# Patient Record
Sex: Female | Born: 1965
Health system: Southern US, Community
[De-identification: ages and names within clinical notes are randomized; demographics above are authoritative.]

## PROBLEM LIST (undated history)

## (undated) DIAGNOSIS — R079 Chest pain, unspecified: Secondary | ICD-10-CM

## (undated) DIAGNOSIS — M1990 Unspecified osteoarthritis, unspecified site: Secondary | ICD-10-CM

## (undated) DIAGNOSIS — E079 Disorder of thyroid, unspecified: Secondary | ICD-10-CM

## (undated) DIAGNOSIS — R002 Palpitations: Secondary | ICD-10-CM

## (undated) DIAGNOSIS — R0600 Dyspnea, unspecified: Secondary | ICD-10-CM

## (undated) DIAGNOSIS — F419 Anxiety disorder, unspecified: Secondary | ICD-10-CM

## (undated) DIAGNOSIS — E559 Vitamin D deficiency, unspecified: Secondary | ICD-10-CM

## (undated) DIAGNOSIS — E785 Hyperlipidemia, unspecified: Secondary | ICD-10-CM

## (undated) DIAGNOSIS — L509 Urticaria, unspecified: Secondary | ICD-10-CM

## (undated) DIAGNOSIS — F32A Depression, unspecified: Secondary | ICD-10-CM

## (undated) DIAGNOSIS — E119 Type 2 diabetes mellitus without complications: Secondary | ICD-10-CM

## (undated) DIAGNOSIS — D649 Anemia, unspecified: Secondary | ICD-10-CM

## (undated) DIAGNOSIS — J189 Pneumonia, unspecified organism: Secondary | ICD-10-CM

## (undated) DIAGNOSIS — E039 Hypothyroidism, unspecified: Secondary | ICD-10-CM

## (undated) HISTORY — DX: Chest pain, unspecified: R07.9

## (undated) HISTORY — DX: Vitamin D deficiency, unspecified: E55.9

## (undated) HISTORY — PX: WISDOM TOOTH EXTRACTION: SHX21

## (undated) HISTORY — DX: Urticaria, unspecified: L50.9

## (undated) HISTORY — DX: Palpitations: R00.2

## (undated) HISTORY — DX: Anxiety disorder, unspecified: F41.9

## (undated) HISTORY — PX: UTERINE FIBROID EMBOLIZATION: SHX825

## (undated) HISTORY — DX: Hyperlipidemia, unspecified: E78.5

---

## 1998-01-28 ENCOUNTER — Encounter: Admission: RE | Admit: 1998-01-28 | Discharge: 1998-04-28 | Payer: Self-pay | Admitting: Family Medicine

## 1999-08-14 ENCOUNTER — Other Ambulatory Visit: Admission: RE | Admit: 1999-08-14 | Discharge: 1999-08-14 | Payer: Self-pay | Admitting: *Deleted

## 2001-01-30 ENCOUNTER — Ambulatory Visit (HOSPITAL_COMMUNITY): Admission: RE | Admit: 2001-01-30 | Discharge: 2001-01-30 | Payer: Self-pay | Admitting: Family Medicine

## 2001-01-30 ENCOUNTER — Encounter: Payer: Self-pay | Admitting: Family Medicine

## 2001-09-19 ENCOUNTER — Encounter: Payer: Self-pay | Admitting: *Deleted

## 2001-09-19 ENCOUNTER — Ambulatory Visit (HOSPITAL_COMMUNITY): Admission: RE | Admit: 2001-09-19 | Discharge: 2001-09-19 | Payer: Self-pay | Admitting: *Deleted

## 2001-11-28 ENCOUNTER — Encounter: Payer: Self-pay | Admitting: Family Medicine

## 2001-11-28 ENCOUNTER — Ambulatory Visit (HOSPITAL_COMMUNITY): Admission: RE | Admit: 2001-11-28 | Discharge: 2001-11-28 | Payer: Self-pay | Admitting: Family Medicine

## 2003-11-26 ENCOUNTER — Other Ambulatory Visit: Admission: RE | Admit: 2003-11-26 | Discharge: 2003-11-26 | Payer: Self-pay | Admitting: Family Medicine

## 2004-07-25 ENCOUNTER — Ambulatory Visit (HOSPITAL_COMMUNITY): Admission: RE | Admit: 2004-07-25 | Discharge: 2004-07-25 | Payer: Self-pay | Admitting: Family Medicine

## 2005-01-25 ENCOUNTER — Encounter (HOSPITAL_COMMUNITY): Admission: RE | Admit: 2005-01-25 | Discharge: 2005-01-26 | Payer: Self-pay | Admitting: Family Medicine

## 2005-02-22 ENCOUNTER — Ambulatory Visit (HOSPITAL_COMMUNITY): Admission: RE | Admit: 2005-02-22 | Discharge: 2005-02-22 | Payer: Self-pay | Admitting: Endocrinology

## 2005-12-04 ENCOUNTER — Other Ambulatory Visit: Admission: RE | Admit: 2005-12-04 | Discharge: 2005-12-04 | Payer: Self-pay | Admitting: Obstetrics and Gynecology

## 2005-12-12 ENCOUNTER — Encounter: Admission: RE | Admit: 2005-12-12 | Discharge: 2005-12-12 | Payer: Self-pay | Admitting: Obstetrics and Gynecology

## 2005-12-28 ENCOUNTER — Encounter: Admission: RE | Admit: 2005-12-28 | Discharge: 2005-12-28 | Payer: Self-pay | Admitting: Obstetrics and Gynecology

## 2007-01-24 ENCOUNTER — Encounter: Admission: RE | Admit: 2007-01-24 | Discharge: 2007-01-24 | Payer: Self-pay | Admitting: Family Medicine

## 2007-06-20 ENCOUNTER — Other Ambulatory Visit: Admission: RE | Admit: 2007-06-20 | Discharge: 2007-06-20 | Payer: Self-pay | Admitting: Obstetrics and Gynecology

## 2008-05-10 ENCOUNTER — Encounter: Admission: RE | Admit: 2008-05-10 | Discharge: 2008-05-10 | Payer: Self-pay | Admitting: Obstetrics and Gynecology

## 2009-07-29 ENCOUNTER — Encounter: Admission: RE | Admit: 2009-07-29 | Discharge: 2009-07-29 | Payer: Self-pay | Admitting: Family Medicine

## 2009-07-29 ENCOUNTER — Other Ambulatory Visit: Admission: RE | Admit: 2009-07-29 | Discharge: 2009-07-29 | Payer: Self-pay | Admitting: Family Medicine

## 2009-12-17 ENCOUNTER — Emergency Department (HOSPITAL_COMMUNITY): Admission: EM | Admit: 2009-12-17 | Discharge: 2009-12-17 | Payer: Self-pay | Admitting: Emergency Medicine

## 2010-02-20 ENCOUNTER — Encounter: Admission: RE | Admit: 2010-02-20 | Discharge: 2010-02-20 | Payer: Self-pay | Admitting: Family Medicine

## 2010-10-01 ENCOUNTER — Encounter: Payer: Self-pay | Admitting: Endocrinology

## 2010-10-02 ENCOUNTER — Encounter: Payer: Self-pay | Admitting: Obstetrics and Gynecology

## 2011-02-08 ENCOUNTER — Other Ambulatory Visit: Payer: Self-pay | Admitting: Family Medicine

## 2011-02-08 DIAGNOSIS — Z1231 Encounter for screening mammogram for malignant neoplasm of breast: Secondary | ICD-10-CM

## 2011-02-15 ENCOUNTER — Ambulatory Visit
Admission: RE | Admit: 2011-02-15 | Discharge: 2011-02-15 | Disposition: A | Discharge status: Home / Self Care | Payer: BC Managed Care – PPO | Source: Ambulatory Visit | Attending: Family Medicine | Admitting: Family Medicine

## 2011-02-15 DIAGNOSIS — Z1231 Encounter for screening mammogram for malignant neoplasm of breast: Secondary | ICD-10-CM

## 2011-03-16 ENCOUNTER — Other Ambulatory Visit: Payer: Self-pay | Admitting: Family Medicine

## 2011-03-16 DIAGNOSIS — N949 Unspecified condition associated with female genital organs and menstrual cycle: Secondary | ICD-10-CM

## 2011-03-19 ENCOUNTER — Ambulatory Visit
Admission: RE | Admit: 2011-03-19 | Discharge: 2011-03-19 | Disposition: A | Discharge status: Home / Self Care | Payer: BC Managed Care – PPO | Source: Ambulatory Visit | Attending: Family Medicine | Admitting: Family Medicine

## 2011-03-19 DIAGNOSIS — N949 Unspecified condition associated with female genital organs and menstrual cycle: Secondary | ICD-10-CM

## 2012-09-12 ENCOUNTER — Other Ambulatory Visit: Payer: Self-pay | Admitting: Family Medicine

## 2012-09-12 ENCOUNTER — Ambulatory Visit
Admission: RE | Admit: 2012-09-12 | Discharge: 2012-09-12 | Disposition: A | Discharge status: Home / Self Care | Payer: BC Managed Care – PPO | Source: Ambulatory Visit | Attending: Family Medicine | Admitting: Family Medicine

## 2012-09-12 ENCOUNTER — Other Ambulatory Visit (HOSPITAL_COMMUNITY)
Admission: RE | Admit: 2012-09-12 | Discharge: 2012-09-12 | Disposition: A | Discharge status: Home / Self Care | Payer: BC Managed Care – PPO | Source: Ambulatory Visit | Attending: Family Medicine | Admitting: Family Medicine

## 2012-09-12 DIAGNOSIS — R05 Cough: Secondary | ICD-10-CM

## 2012-09-12 DIAGNOSIS — Z0000 Encounter for general adult medical examination without abnormal findings: Secondary | ICD-10-CM | POA: Insufficient documentation

## 2012-09-12 DIAGNOSIS — R059 Cough, unspecified: Secondary | ICD-10-CM

## 2013-03-20 ENCOUNTER — Other Ambulatory Visit: Payer: Self-pay | Admitting: Family Medicine

## 2013-03-20 ENCOUNTER — Other Ambulatory Visit: Payer: Self-pay | Admitting: *Deleted

## 2013-03-20 ENCOUNTER — Ambulatory Visit
Admission: RE | Admit: 2013-03-20 | Discharge: 2013-03-20 | Disposition: A | Discharge status: Home / Self Care | Payer: BC Managed Care – PPO | Source: Ambulatory Visit | Attending: Family Medicine | Admitting: Family Medicine

## 2013-03-20 DIAGNOSIS — R609 Edema, unspecified: Secondary | ICD-10-CM

## 2013-03-20 DIAGNOSIS — R52 Pain, unspecified: Secondary | ICD-10-CM

## 2013-05-28 ENCOUNTER — Other Ambulatory Visit: Payer: Self-pay

## 2013-05-28 DIAGNOSIS — Z1231 Encounter for screening mammogram for malignant neoplasm of breast: Secondary | ICD-10-CM

## 2013-06-29 ENCOUNTER — Ambulatory Visit: Payer: BC Managed Care – PPO

## 2013-08-04 ENCOUNTER — Ambulatory Visit
Admission: RE | Admit: 2013-08-04 | Discharge: 2013-08-04 | Disposition: A | Discharge status: Home / Self Care | Payer: BC Managed Care – PPO | Source: Ambulatory Visit

## 2013-08-04 DIAGNOSIS — Z1231 Encounter for screening mammogram for malignant neoplasm of breast: Secondary | ICD-10-CM

## 2013-11-13 ENCOUNTER — Other Ambulatory Visit: Payer: Self-pay | Admitting: Family Medicine

## 2013-11-13 DIAGNOSIS — R0781 Pleurodynia: Secondary | ICD-10-CM

## 2013-11-17 ENCOUNTER — Ambulatory Visit
Admission: RE | Admit: 2013-11-17 | Discharge: 2013-11-17 | Disposition: A | Discharge status: Home / Self Care | Payer: BC Managed Care – PPO | Source: Ambulatory Visit | Attending: Family Medicine | Admitting: Family Medicine

## 2013-11-17 DIAGNOSIS — R0781 Pleurodynia: Secondary | ICD-10-CM

## 2014-03-01 ENCOUNTER — Ambulatory Visit
Admission: RE | Admit: 2014-03-01 | Discharge: 2014-03-01 | Disposition: A | Discharge status: Home / Self Care | Payer: BC Managed Care – PPO | Source: Ambulatory Visit | Attending: Family Medicine | Admitting: Family Medicine

## 2014-03-01 ENCOUNTER — Other Ambulatory Visit: Payer: Self-pay | Admitting: Family Medicine

## 2014-03-01 DIAGNOSIS — M79641 Pain in right hand: Secondary | ICD-10-CM

## 2014-03-09 ENCOUNTER — Other Ambulatory Visit: Payer: Self-pay | Admitting: Orthopaedic Surgery

## 2014-03-09 DIAGNOSIS — M25512 Pain in left shoulder: Secondary | ICD-10-CM

## 2014-03-20 ENCOUNTER — Ambulatory Visit
Admission: RE | Admit: 2014-03-20 | Discharge: 2014-03-20 | Disposition: A | Discharge status: Home / Self Care | Payer: BC Managed Care – PPO | Source: Ambulatory Visit | Attending: Orthopaedic Surgery | Admitting: Orthopaedic Surgery

## 2014-03-20 DIAGNOSIS — M25512 Pain in left shoulder: Secondary | ICD-10-CM

## 2014-06-24 ENCOUNTER — Encounter: Payer: Self-pay | Admitting: Internal Medicine

## 2014-06-24 ENCOUNTER — Other Ambulatory Visit: Payer: Self-pay | Admitting: *Deleted

## 2014-06-24 ENCOUNTER — Ambulatory Visit (INDEPENDENT_AMBULATORY_CARE_PROVIDER_SITE_OTHER): Payer: BC Managed Care – PPO | Admitting: Internal Medicine

## 2014-06-24 VITALS — BP 118/76 | HR 80 | Temp 97.8°F | Resp 12 | Ht 66.0 in | Wt 179.0 lb

## 2014-06-24 DIAGNOSIS — E1165 Type 2 diabetes mellitus with hyperglycemia: Secondary | ICD-10-CM

## 2014-06-24 DIAGNOSIS — IMO0002 Reserved for concepts with insufficient information to code with codable children: Secondary | ICD-10-CM

## 2014-06-24 DIAGNOSIS — E890 Postprocedural hypothyroidism: Secondary | ICD-10-CM

## 2014-06-24 LAB — TSH: TSH: 0.31 u[IU]/mL — ABNORMAL LOW (ref 0.35–4.50)

## 2014-06-24 LAB — T4, FREE: Free T4: 1.18 ng/dL (ref 0.60–1.60)

## 2014-06-24 MED ORDER — INSULIN PEN NEEDLE 32G X 4 MM MISC: Status: DC

## 2014-06-24 MED ORDER — INSULIN DETEMIR 100 UNIT/ML FLEXPEN: 30.0000 [IU] | PEN_INJECTOR | Freq: Every day | SUBCUTANEOUS | Status: DC

## 2014-06-24 MED ORDER — INSULIN ASPART 100 UNIT/ML ~~LOC~~ SOLN: SUBCUTANEOUS | Status: DC

## 2014-06-24 MED ORDER — "INSULIN SYRINGE 31G X 5/16"" 0.3 ML MISC": Status: DC

## 2014-06-24 MED ORDER — LEVOTHYROXINE SODIUM 100 MCG PO TABS: 100.0000 ug | ORAL_TABLET | Freq: Every day | ORAL | Status: DC

## 2014-06-24 MED ORDER — METFORMIN HCL ER 500 MG PO TB24: 1000.0000 mg | ORAL_TABLET | Freq: Every day | ORAL | Status: DC

## 2014-06-24 MED ORDER — INSULIN ASPART 100 UNIT/ML FLEXPEN: 8.0000 [IU] | PEN_INJECTOR | Freq: Three times a day (TID) | SUBCUTANEOUS | Status: DC

## 2014-06-24 NOTE — Patient Instructions (Signed)
Please stop JanuMet. Start Metformin XR 1000 mg at dinnertime. Increase Levemir to 30 units and move it at bedtime. Add NovoLog rapid acting insulin and inject this 15 min before every meal. - 8 units before a smaller meal - 10 units before a regular meal - 12 units before a large meal Please let me know if the sugars are consistently <80 or >200.  Please return in 1 month with your sugar log.   PATIENT INSTRUCTIONS FOR TYPE 2 DIABETES:  **Please join MyChart!** - see attached instructions about how to join if you have not done so already.  DIET AND EXERCISE Diet and exercise is an important part of diabetic treatment.  We recommended aerobic exercise in the form of brisk walking (working between 40-60% of maximal aerobic capacity, similar to brisk walking) for 150 minutes per week (such as 30 minutes five days per week) along with 3 times per week performing 'resistance' training (using various gauge rubber tubes with handles) 5-10 exercises involving the major muscle groups (upper body, lower body and core) performing 10-15 repetitions (or near fatigue) each exercise. Start at half the above goal but build slowly to reach the above goals. If limited by weight, joint pain, or disability, we recommend daily walking in a swimming pool with water up to waist to reduce pressure from joints while allow for adequate exercise.    BLOOD GLUCOSES Monitoring your blood glucoses is important for continued management of your diabetes. Please check your blood glucoses 2-4 times a day: fasting, before meals and at bedtime (you can rotate these measurements - e.g. one day check before the 3 meals, the next day check before 2 of the meals and before bedtime, etc.).   HYPOGLYCEMIA (low blood sugar) Hypoglycemia is usually a reaction to not eating, exercising, or taking too much insulin/ other diabetes drugs.  Symptoms include tremors, sweating, hunger, confusion, headache, etc. Treat IMMEDIATELY with 15  grams of Carbs:   4 glucose tablets    cup regular juice/soda   2 tablespoons raisins   4 teaspoons sugar   1 tablespoon honey Recheck blood glucose in 15 mins and repeat above if still symptomatic/blood glucose <100.  RECOMMENDATIONS TO REDUCE YOUR RISK OF DIABETIC COMPLICATIONS: * Take your prescribed MEDICATION(S) * Follow a DIABETIC diet: Complex carbs, fiber rich foods, (monounsaturated and polyunsaturated) fats * AVOID saturated/trans fats, high fat foods, >2,300 mg salt per day. * EXERCISE at least 5 times a week for 30 minutes or preferably daily.  * DO NOT SMOKE OR DRINK more than 1 drink a day. * Check your FEET every day. Do not wear tightfitting shoes. Contact us if you develop an ulcer * See your EYE doctor once a year or more if needed * Get a FLU shot once a year * Get a PNEUMONIA vaccine once before and once after age 44 years  GOALS:  * Your Hemoglobin A1c of <7%  * fasting sugars need to be <130 * after meals sugars need to be <180 (2h after you start eating) * Your Systolic BP should be 161 or lower  * Your Diastolic BP should be 80 or lower  * Your HDL (Good Cholesterol) should be 40 or higher  * Your LDL (Bad Cholesterol) should be 100 or lower. * Your Triglycerides should be 150 or lower  * Your Urine microalbumin (kidney function) should be <30 * Your Body Mass Index should be 25 or lower    Please consider the following ways to cut  down carbs and fat and increase fiber and micronutrients in your diet: - substitute whole grain for white bread or pasta - substitute brown rice for white rice - substitute 90-calorie flat bread pieces for slices of bread when possible - substitute sweet potatoes or yams for white potatoes - substitute humus for margarine - substitute tofu for cheese when possible - substitute almond or rice milk for regular milk (would not drink soy milk daily due to concern for soy estrogen influence on breast cancer risk) - substitute  dark chocolate for other sweets when possible - substitute water - can add lemon or orange slices for taste - for diet sodas (artificial sweeteners will trick your body that you can eat sweets without getting calories and will lead you to overeating and weight gain in the long run) - do not skip breakfast or other meals (this will slow down the metabolism and will result in more weight gain over time)  - can try smoothies made from fruit and almond/rice milk in am instead of regular breakfast - can also try old-fashioned (not instant) oatmeal made with almond/rice milk in am - order the dressing on the side when eating salad at a restaurant (pour less than half of the dressing on the salad) - eat as little meat as possible - can try juicing, but should not forget that juicing will get rid of the fiber, so would alternate with eating raw veg./fruits or drinking smoothies - use as little oil as possible, even when using olive oil - can dress a salad with a mix of balsamic vinegar and lemon juice, for e.g. - use agave nectar, stevia sugar, or regular sugar rather than artificial sweateners - steam or broil/roast veggies  - snack on veggies/fruit/nuts (unsalted, preferably) when possible, rather than processed foods - reduce or eliminate aspartame in diet (it is in diet sodas, chewing gum, etc) Read the labels!  Try to read Dr. Janene Harvey book: "Program for Reversing Diabetes" for other ideas for healthy eating.

## 2014-06-24 NOTE — Progress Notes (Signed)
Patient ID: Samantha [name], female   DOB: 02/14/1966, 48 y.o.   MRN: [id number]  HPI: Samantha [name] is a 48 y.o.-year-old female, referred by her PCP, Dr.White, for management of DM2, insulin-dependent, uncontrolled, without complications.  Patient has been diagnosed with diabetes in 2012; she started insulin in 2015.  Last hemoglobin A1c was: 05/27/2014: 12% 02/2014: 9%  Pt is on a regimen of: - JanuMet XR [id number] mg with dinner - Levemir 22 units qam She was on Farxiga x 2 years, but this was stopped b/c weight loss (205 >> 179 lbs), urinary frequency. She tried regular metformin >> GI sxs.  Pt checks her sugars 1x a day and they are: - am: 282-421 - 2h after b'fast: 282-521 - before lunch: n/c - 2h after lunch: n/c - before dinner: n/c - 2h after dinner: n/c - bedtime: n/c - nighttime: n/c No lows. Lowest sugar was 200s; she has hypoglycemia awareness - ? value  Highest sugar was 500s.  Pt's meals are: - Breakfast: cereal or bisquit - Lunch: hamburger, chinese - Dinner: chicken  - Snacks: 2-3  - no CKD, last BUN/creatinine:  11/16/2013: 20/0.92, GFR 79 - Lipid panel not available. On Crestor. - last eye exam was in 05/2014. No DR.  - no numbness and tingling in her feet.  Pt has FH of DM in mother and brother.  She has a h/o hypothyroidism 2/2 RAI Tx for Graves ds. - She takes LT4 at 7 am. She also has GERD, on Dexilant. Takes this with b'fast!  Reviewed previous TFTs: 07/28/2013: TSH 1.69  She would like me to check her TFTs today   Has FH of hypothyroidism in mother.  ROS: Constitutional: + weight loss, no fatigue, + subjective hyperthermia (hot flushes), + increasd urination and nocturia Eyes: + blurry vision, no xerophthalmia ENT: no sore throat, no nodules palpated in throat, no dysphagia/odynophagia, + hoarseness Cardiovascular: no CP/SOB/palpitations/leg swelling Respiratory: no cough/SOB Gastrointestinal: + N/no V/D/C, +  GERD Musculoskeletal:+ both muscle/joint aches Skin: no rashes, + easy bruising, + hair loss Neurological: no tremors/numbness/tingling/dizziness, + HA Psychiatric: + both: depression/anxiety + low libido  PMH: - Hypothyroidism - DM2 - GERD - Depression/anxiety  No past surgical history on file.  History   Social History  . Marital Status: Single    Spouse Name: N/A    Number of Children: 1, 59 y/o   Occupational History  . Pt account rep   Social History Main Topics  . Smoking status: Never Smoker   . Smokeless tobacco: No  . Alcohol Use: No  . Drug Use: No   Current Outpatient Rx  Name  Route  Sig  Dispense  Refill  . SitaGLIPtin-MetFORMIN HCl (JANUMET XR) [id number] MG TB24      1 tablet         . ACCU-CHEK SOFTCLIX LANCETS lancets               . CRESTOR 5 MG tablet               . DEXILANT 60 MG capsule               . LEVEMIR FLEXTOUCH 100 UNIT/ML Pen               . levothyroxine (SYNTHROID, LEVOTHROID) 112 MCG tablet               . sertraline (ZOLOFT) 100 MG tablet  Allergies  Allergen Reactions  . Atorvastatin   . Bupropion   . Doxepin Hcl    FH: - see HPI PE: BP 118/76  Pulse 80  Temp(Src) 97.8 F (36.6 C) (Oral)  Resp 12  Ht 5\' 6"  (1.676 m)  Wt 179 lb (81.194 kg)  BMI 28.91 kg/m2  SpO2 96%  LMP 07/10/2012 Wt Readings from Last 3 Encounters:  06/24/14 179 lb (81.194 kg)   Constitutional: overweight, in NAD Eyes: PERRLA, EOMI, no exophthalmos ENT: moist mucous membranes, no thyromegaly, no cervical lymphadenopathy Cardiovascular: RRR, No MRG Respiratory: CTA B Gastrointestinal: abdomen soft, NT, ND, BS+ Musculoskeletal: no deformities, strength intact in all 4 Skin: moist, warm, no rashes Neurological: no tremor with outstretched hands, DTR normal in all 4  ASSESSMENT: 1. DM2, insulin-dependent, uncontrolled, without complications  2. Hypothyroidism - postablative for Graves  ds.  PLAN:  1. Patient with 3 years h/o uncontrolled diabetes, on oral antidiabetic regimen + basal insulin, which became insufficient - We discussed about options for treatment, and I suggested to add mealtime insulin:  Patient Instructions  Please stop JanuMet. Start Metformin XR 1000 mg at dinnertime. Increase Levemir to 30 units and move it at bedtime. Add NovoLog rapid acting insulin and inject this 15 min before every meal. - 8 units before a smaller meal - 10 units before a regular meal - 12 units before a large meal Please let me know if the sugars are consistently <80 or >200.  Please return in 1 month with your sugar log.   - Strongly advised her to start checking sugars at different times of the day - check 2-3 times a day, rotating checks - given sugar log and advised how to fill it and to bring it at next appt  - given foot care handout and explained the principles  - given instructions for hypoglycemia management "15-15 rule"  - advised for yearly eye exams >> she is UTD - she had the flu vaccine on 05/27/2014 - Return to clinic in 1 mo with sugar log   2. Hypothyroidism - we discussed about taking the thyroid hormone every day, with water, >30 minutes before breakfast, separated by >4 hours from acid reflux medications, calcium, iron, multivitamins. She needs to move the Dexilant before lunch. - will continue the LT4 112 mcg for now - will check TSH, free t4  Office Visit on 06/24/2014  Component Date Value Ref Range Status  . TSH 06/24/2014 0.31* 0.35 - 4.50 uIU/mL Final  . Free T4 06/24/2014 1.18  0.60 - 1.60 ng/dL Final   TSH a little low >> will decrease the dose of LT4 to 100 mcg since we are also going to separate it more from [place]. Will repeat thyroid labs when she comes back/.

## 2014-06-29 ENCOUNTER — Ambulatory Visit
Admission: RE | Admit: 2014-06-29 | Discharge: 2014-06-29 | Disposition: A | Discharge status: Home / Self Care | Payer: BC Managed Care – PPO | Source: Ambulatory Visit | Attending: Family Medicine | Admitting: Family Medicine

## 2014-06-29 ENCOUNTER — Other Ambulatory Visit: Payer: Self-pay | Admitting: Family Medicine

## 2014-06-29 DIAGNOSIS — S40012A Contusion of left shoulder, initial encounter: Secondary | ICD-10-CM

## 2014-07-28 ENCOUNTER — Ambulatory Visit (INDEPENDENT_AMBULATORY_CARE_PROVIDER_SITE_OTHER): Payer: BC Managed Care – PPO | Admitting: Internal Medicine

## 2014-07-28 ENCOUNTER — Encounter: Payer: Self-pay | Admitting: Internal Medicine

## 2014-07-28 VITALS — BP 114/76 | HR 93 | Temp 97.9°F | Resp 12 | Wt 179.8 lb

## 2014-07-28 DIAGNOSIS — E1165 Type 2 diabetes mellitus with hyperglycemia: Secondary | ICD-10-CM

## 2014-07-28 DIAGNOSIS — IMO0002 Reserved for concepts with insufficient information to code with codable children: Secondary | ICD-10-CM

## 2014-07-28 DIAGNOSIS — E890 Postprocedural hypothyroidism: Secondary | ICD-10-CM

## 2014-07-28 MED ORDER — INSULIN ASPART 100 UNIT/ML FLEXPEN
15.0000 [IU] | PEN_INJECTOR | Freq: Three times a day (TID) | SUBCUTANEOUS | Status: DC
Start: 2014-07-28 — End: 2014-07-28

## 2014-07-28 MED ORDER — INSULIN DETEMIR 100 UNIT/ML FLEXPEN
50.0000 [IU] | PEN_INJECTOR | Freq: Every day | SUBCUTANEOUS | Status: DC
Start: 2014-07-28 — End: 2014-09-13

## 2014-07-28 MED ORDER — METFORMIN HCL ER 500 MG PO TB24: 1000.0000 mg | ORAL_TABLET | Freq: Two times a day (BID) | ORAL | Status: DC

## 2014-07-28 MED ORDER — INSULIN ASPART 100 UNIT/ML FLEXPEN: 15.0000 [IU] | PEN_INJECTOR | Freq: Three times a day (TID) | SUBCUTANEOUS | Status: DC

## 2014-07-28 NOTE — Progress Notes (Signed)
Patient ID: Samantha [name], female   DOB: [birth year], 48 y.o.   MRN: [id number]  HPI: Samantha [name] is a 48 y.o.-year-old female, returning for follow-up for DM2, dx 2012, insulin-dependent since 2015, uncontrolled, without complications. Last visit a month ago.  Last hemoglobin A1c was: 05/27/2014: 12% 02/2014: 9%  Pt was on a regimen of: - JanuMet XR [phone] mg with dinner - Levemir 22 units qam She was on Farxiga x 2 years, but this was stopped b/c weight loss (205 >> 179 lbs), urinary frequency. She tried regular metformin >> GI sxs.  At last visit, we changed to: - Metformin XR 1000 mg at dinnertime. - Levemir 30 units at bedtime. - NovoLog 15 min before every meal. - 8 units before a smaller meal - 10 units before a regular meal - 12 units before a large meal  Pt checks her sugars 2-3x a day and they are still very high: - am: 282-421 >> 282-365 - 2h after b'fast: 282-521 >> 279-404 - before lunch: n/c >> 439 - 2h after lunch: n/c >> 242-431 - before dinner: n/c >> 370-514 - 2h after dinner: n/c >> 320-407, 547 - bedtime: n/c - nighttime: n/c No lows. Lowest sugar was 200s; she has hypoglycemia awareness - ? value  Highest sugar was 500s.  Pt's meals are: - Breakfast: cereal or bisquit - Lunch: hamburger, chinese - Dinner: chicken  - Snacks: 2-3  - no CKD, last BUN/creatinine:  11/16/2013: 20/0.92, GFR 79 - Lipid panel not available. On Crestor. - last eye exam was in 05/2014. No DR.  - no numbness and tingling in her feet.  She has a h/o hypothyroidism 2/2 RAI Tx for Graves ds. - She takes LT4 at 7 am. She also has GERD, on Dexilant. She was taking this with b'fast!, but at last visit, we removed the [place] later in the day. A repeat set of thyroid tests showed a low TSH ( see below), and therefore we decreased her levothyroxine dose to 100 g daily.  Reviewed previous TFTs: Lab Results  Component Value Date   TSH 0.31* 06/24/2014   FREET4 1.18  06/24/2014  07/28/2013: TSH 1.69  I reviewed pt's medications, allergies, PMH, social hx, family hx and no changes required, except as mentioned above.  ROS: Constitutional: no weight gain/loss, no fatigue, + subjective hyperthermia (hot flushes), + nocturia Eyes: + blurry vision, no xerophthalmia ENT: no sore throat, no nodules palpated in throat, no dysphagia/odynophagia, + hoarseness, + tinnitus Cardiovascular: no CP/SOB/palpitations/leg swelling Respiratory: no cough/SOB Gastrointestinal: no N/V/D/C/GERD Musculoskeletal: no muscle/+ joint aches Skin: no rashes, + easy bruising, + hair loss Neurological: no tremors/numbness/tingling/dizziness  PE: BP 114/76 mmHg  Pulse 93  Temp(Src) 97.9 F (36.6 C) (Oral)  Resp 12  Wt 179 lb 12.8 oz (81.557 kg)  SpO2 96%  LMP 07/10/2012 Wt Readings from Last 3 Encounters:  07/28/14 179 lb 12.8 oz (81.557 kg)  06/24/14 179 lb (81.194 kg)   Constitutional: overweight, in NAD Eyes: PERRLA, EOMI, no exophthalmos ENT: moist mucous membranes, no thyromegaly, no cervical lymphadenopathy Cardiovascular: RRR, No MRG Respiratory: CTA B Gastrointestinal: abdomen soft, NT, ND, BS+ Musculoskeletal: no deformities, strength intact in all 4 Skin: moist, warm, no rashes Neurological: no tremor with outstretched hands, DTR normal in all 4  ASSESSMENT: 1. DM2, insulin-dependent, uncontrolled, without complications  2. Hypothyroidism - postablative for Graves ds.  PLAN:  1. Patient with 3 years h/o uncontrolled diabetes, on oral antidiabetic regimen + basal-bolus insulin regimen with still  very poor control. - We discussed about options for treatment, and I suggested to increase metformin and insulin doses:  Patient Instructions  Increase Metformin XR to 1000 mg 2x a day, with meals. Increase Levemir to 50 units at bedtime. Increase NovoLog rapid acting insulin 15 min before every meal. - 15 units before a smaller meal - 20 units before a  regular meal - 25 units before a large meal Please let me know if the sugars are consistently <80 or >200.  Please return in 1 month with your sugar log.   - continue checking sugars at different times of the day - check 2-3 times a day, rotating checks - advised for yearly eye exams >> she is UTD - she had the flu vaccine on 05/27/2014 - she is interested in nutrition advice >> will refer her - refilled Novolog - Return to clinic in 1 mo with sugar log >> check HbA1c and Lipids then  2. Hypothyroidism - she is taking the thyroid hormone every day, with water, >30 minutes before breakfast, separated by >4 hours from acid reflux medications, calcium, iron, multivitamins. She did move the Dexilant before lunch. - TSH decreased after moving the Dexilant >> we decreased the LT4 dose to 100 mcg daily. She feels well on this dose. - will continue the LT4 100 mcg for now - will check TSH, free t4 in 1 mo when she comes back

## 2014-07-28 NOTE — Patient Instructions (Addendum)
Patient Instructions  Increase Metformin XR to 1000 mg 2x a day, with meals. Increase Levemir to 50 units at bedtime. Increase NovoLog rapid acting insulin 15 min before every meal. - 15 units before a smaller meal - 20 units before a regular meal - 25 units before a large meal Please let me know if the sugars are consistently <80 or >200.  Please return in 1 month with your sugar log.   Please consider the following ways to cut down carbs and fat and increase fiber and micronutrients in your diet: - substitute whole grain for white bread or pasta - substitute brown rice for white rice - substitute 90-calorie flat bread pieces for slices of bread when possible - substitute sweet potatoes or yams for white potatoes - substitute humus for margarine - substitute tofu for cheese when possible - substitute almond or rice milk for regular milk (would not drink soy milk daily due to concern for soy estrogen influence on breast cancer risk) - substitute dark chocolate for other sweets when possible - substitute water - can add lemon or orange slices for taste - for diet sodas (artificial sweeteners will trick your body that you can eat sweets without getting calories and will lead you to overeating and weight gain in the long run) - do not skip breakfast or other meals (this will slow down the metabolism and will result in more weight gain over time)  - can try smoothies made from fruit and almond/rice milk in am instead of regular breakfast - can also try old-fashioned (not instant) oatmeal made with almond/rice milk in am - order the dressing on the side when eating salad at a restaurant (pour less than half of the dressing on the salad) - eat as little meat as possible - can try juicing, but should not forget that juicing will get rid of the fiber, so would alternate with eating raw veg./fruits or drinking smoothies - use as little oil as possible, even when using olive oil - can dress a salad  with a mix of balsamic vinegar and lemon juice, for e.g. - use agave nectar, stevia sugar, or regular sugar rather than artificial sweateners - steam or broil/roast veggies  - snack on veggies/fruit/nuts (unsalted, preferably) when possible, rather than processed foods - reduce or eliminate aspartame in diet (it is in diet sodas, chewing gum, etc) Read the labels!  Try to read Dr. Janene Harvey book: "Program for Reversing Diabetes" for the vegan concept and other ideas for healthy eating.

## 2014-08-02 ENCOUNTER — Other Ambulatory Visit: Payer: Self-pay | Admitting: *Deleted

## 2014-08-02 MED ORDER — ONETOUCH LANCETS MISC: Status: DC

## 2014-08-02 MED ORDER — GLUCOSE BLOOD VI STRP: ORAL_STRIP | Status: DC

## 2014-08-02 MED ORDER — ONETOUCH VERIO W/DEVICE KIT: 1.0000 | PACK | Freq: Every day | Status: DC

## 2014-08-24 ENCOUNTER — Other Ambulatory Visit: Payer: Self-pay

## 2014-08-24 DIAGNOSIS — Z1231 Encounter for screening mammogram for malignant neoplasm of breast: Secondary | ICD-10-CM

## 2014-08-27 ENCOUNTER — Ambulatory Visit: Payer: BC Managed Care – PPO | Admitting: Internal Medicine

## 2014-09-13 ENCOUNTER — Other Ambulatory Visit: Payer: Self-pay | Admitting: *Deleted

## 2014-09-13 ENCOUNTER — Ambulatory Visit (INDEPENDENT_AMBULATORY_CARE_PROVIDER_SITE_OTHER): Payer: BLUE CROSS/BLUE SHIELD | Admitting: Internal Medicine

## 2014-09-13 ENCOUNTER — Encounter: Payer: Self-pay | Admitting: Skilled Nursing Facility1

## 2014-09-13 ENCOUNTER — Encounter: Payer: Self-pay | Admitting: Internal Medicine

## 2014-09-13 ENCOUNTER — Encounter: Payer: BLUE CROSS/BLUE SHIELD | Attending: Internal Medicine | Admitting: Skilled Nursing Facility1

## 2014-09-13 VITALS — Ht 65.0 in | Wt 181.0 lb

## 2014-09-13 VITALS — BP 112/64 | HR 108 | Temp 97.5°F | Resp 12 | Wt 182.8 lb

## 2014-09-13 DIAGNOSIS — IMO0002 Reserved for concepts with insufficient information to code with codable children: Secondary | ICD-10-CM

## 2014-09-13 DIAGNOSIS — E890 Postprocedural hypothyroidism: Secondary | ICD-10-CM | POA: Diagnosis not present

## 2014-09-13 DIAGNOSIS — Z794 Long term (current) use of insulin: Secondary | ICD-10-CM | POA: Insufficient documentation

## 2014-09-13 DIAGNOSIS — E1165 Type 2 diabetes mellitus with hyperglycemia: Secondary | ICD-10-CM | POA: Insufficient documentation

## 2014-09-13 DIAGNOSIS — Z713 Dietary counseling and surveillance: Secondary | ICD-10-CM | POA: Insufficient documentation

## 2014-09-13 MED ORDER — METFORMIN HCL ER 500 MG PO TB24
1000.0000 mg | ORAL_TABLET | Freq: Two times a day (BID) | ORAL | Status: DC
Start: 2014-09-13 — End: 2015-02-15

## 2014-09-13 MED ORDER — INSULIN DETEMIR 100 UNIT/ML FLEXPEN: 60.0000 [IU] | PEN_INJECTOR | Freq: Every day | SUBCUTANEOUS | Status: DC

## 2014-09-13 NOTE — Progress Notes (Signed)
Diabetes Self-Management Education  Visit Type:    Appt. Start Time: 8:12 Appt. End Time: 9:30  09/13/2014  Samantha [name], identified by name and date of birth, is a 49 y.o. female with a diagnosis of Diabetes: Type 2.  Other people present during visit:      ASSESSMENT  Height 5\' 5"  (1.651 m), weight 181 lb (82.101 kg), last menstrual period 07/10/2012. Body mass index is 30.12 kg/(m^2).   Patient cares for her mother who also has type 2 diabetes and works full time so she sates she does not have time for physical activity. Patient also states she has run out of metformin. She reports she used a coworkers blood sugar instead of her own but she did tell her doctor.   Initial Visit Information:  Are you currently following a meal plan?: No   Are you taking your medications as prescribed?: Yes Are you checking your feet?: No   How often do you need to have someone help you when you read instructions, pamphlets, or other written materials from your doctor or pharmacy?: 1 - Never    Psychosocial:     Patient Belief/Attitude about Diabetes:  (Patient "Just wants it to go away") Self-care barriers:  (Caring for her mother) Self-management support: Doctor's office, Friends Patient Concerns: Nutrition/Meal planning, Healthy Lifestyle, Glycemic Control Special Needs: None Preferred Learning Style: Auditory, Visual Learning Readiness: Ready  Complications:   Last HgB A1C per patient/outside source: 12 mg/dL How often do you check your blood sugar?: 3-4 times/day Fasting Blood glucose range (mg/dL): >200 Postprandial Blood glucose range (mg/dL): >200 Number of hypoglycemic episodes per month: 0 Number of hyperglycemic episodes per week: 4 Can you tell when your blood sugar is high?: Yes What do you do if your blood sugar is high?: Take insulin Have you had a dilated eye exam in the past 12 months?: Yes Have you had a dental exam in the past 12 months?: Yes  Diet  Intake:  Breakfast: cereal-----pick up fast food Snack (morning): chips Lunch: fast food Snack (afternoon): candy bar----soda Dinner: grilled chicken-----pork chops, salad-----starch Snack (evening): chips Beverage(s): soda, lemonade mixed with tea, cran-grape juice, orange juice, water  Exercise:  Exercise: ADL's  Individualized Plan for Diabetes Self-Management Training:   Learning Objective:  Patient will have a greater understanding of diabetes self-management.  Patient education plan per assessed needs and concerns is to attend individual sessions for     Education Topics Reviewed with Patient Today:  Definition of diabetes, type 1 and 2, and the diagnosis of diabetes Role of diet in the treatment of diabetes and the relationship between the three main macronutrients and blood glucose level, Food label reading, portion sizes and measuring food., Carbohydrate counting, Information on hints to eating out and maintain blood glucose control. Role of exercise on diabetes management, blood pressure control and cardiac health., Identified with patient nutritional and/or medication changes necessary with exercise.   Daily foot exams Discussed and identified patients' treatment of hyperglycemia.   Worked with patient to identify barriers to care and solutions   Lifestyle issues that need to be addressed for better diabetes care  PATIENTS GOALS/Plan (Developed by the patient):  Nutrition: Follow meal plan discussed, General guidelines for healthy choices and portions discussed Physical Activity: 15 minutes per day Medications: take my medication as prescribed Reducing Risk: do foot checks daily  Plan:   There are no Patient Instructions on file for this visit.  Expected Outcomes:     Education material  provided: Living Well with Diabetes, Meal plan card and Snack sheet  If problems or questions, patient to contact team via:  Phone  Future DSME appointment:

## 2014-09-13 NOTE — Patient Instructions (Signed)
Please increase Levemir to 60 units. Please do not forget the lunchtime NovoLog. Continue same doses of NovoLog 15 min before every meal. - 15 units before a smaller meal - 20 units before a regular meal - 25 units before a large meal Restart Metformin XR 1000 mg 2x a day, with meals.  Please stop at [facility] lab downstairs.  Please return in 1.5 month with your sugar log.

## 2014-09-13 NOTE — Progress Notes (Signed)
Patient ID: Samantha [name], female   DOB: 09-Jul-1966, 49 y.o.   MRN: [id number]  HPI: Samantha [name] is a 49 y.o.-year-old female, returning for follow-up for DM2, dx 2012, insulin-dependent since 2015, uncontrolled, without complications. Last visit 1.5 ago.  Last hemoglobin A1c was: 05/27/2014: 12% 02/2014: 9%  Pt was on a regimen of: - JanuMet XR [phone] mg with dinner - Levemir 22 units qam She was on Farxiga x 2 years, but this was stopped b/c weight loss (205 >> 179 lbs), urinary frequency. She tried regular metformin >> GI sxs.  She is now on: Metformin XR to 1000 mg 2x a day, with meals. Ran out 1 week ago. Levemir to 50 units at bedtime. NovoLog rapid acting insulin 15 min before every meal. - 15 units before a smaller meal - 20 units before a regular meal - 25 units before a large meal  She transferred from [facility] to [facility].  Pt checks her sugars 2-3x a day and they are better: - am: 282-421 >> 282-365 >> 205-216 - 2h after b'fast: 282-521 >> 279-404 >> n/c - before lunch: n/c >> 439 >> 179-216 - 2h after lunch: n/c >> 242-431 >> n/c - before dinner: n/c >> 370-514 >> 215-220 - 2h after dinner: n/c >> 320-407, 547 >> n/c - bedtime: n/c - nighttime: n/c No lows. Lowest sugar was 179; she has hypoglycemia awareness - ? value  Highest sugar was 500s >> 305 this am (off Metformin, drank sweet tea at 2 am).  Pt's meals are: - Breakfast: cereal or bisquit - Lunch: hamburger, chinese - Dinner: chicken  - Snacks: 2-3  She saw nutritionist today.   - no CKD, last BUN/creatinine:  11/16/2013: 20/0.92, GFR 79 - Lipid panel not available. On Crestor. - last eye exam was in 05/2014. No DR.  - no numbness and tingling in her feet.  She has a h/o hypothyroidism 2/2 RAI Tx for Graves ds. - She takes LT4 at 7 am. She also has GERD, on Dexilant >> later in the day.   A repeat set of thyroid tests after separating LT4 from [place] showed a low TSH ( see below),  and therefore we decreased her levothyroxine dose to 100 g daily. Now off Dexilant.   Reviewed previous TFTs: Lab Results  Component Value Date   TSH 0.31* 06/24/2014   FREET4 1.18 06/24/2014  07/28/2013: TSH 1.69  I reviewed pt's medications, allergies, PMH, social hx, family hx, and changes were documented in the history of present illness. Otherwise, unchanged from my initial visit note.  ROS: Constitutional: no weight gain/loss, + fatigue, no subjective hyperthermia  Eyes: no blurry vision, no xerophthalmia ENT: no sore throat, no nodules palpated in throat, no dysphagia/odynophagia Cardiovascular: no CP/SOB/palpitations/leg swelling Respiratory: no cough/SOB Gastrointestinal: no N/V/D/C/GERD Musculoskeletal: no muscle, joint aches Skin: no rashes, + hair loss Neurological: no tremors/numbness/tingling/dizziness + low libido  PE: BP 112/64 mmHg  Pulse 108  Temp(Src) 97.5 F (36.4 C) (Oral)  Resp 12  Wt 182 lb 12.8 oz (82.918 kg)  SpO2 96%  LMP 07/10/2012 Wt Readings from Last 3 Encounters:  09/13/14 182 lb 12.8 oz (82.918 kg)  09/13/14 181 lb (82.101 kg)  07/28/14 179 lb 12.8 oz (81.557 kg)   Constitutional: overweight, in NAD Eyes: PERRLA, EOMI, no exophthalmos ENT: moist mucous membranes, no thyromegaly, no cervical lymphadenopathy Cardiovascular: RRR, No MRG Respiratory: CTA B Gastrointestinal: abdomen soft, NT, ND, BS+ Musculoskeletal: no deformities, strength intact in all 4 Skin: moist, warm,  no rashes Neurological: no tremor with outstretched hands, DTR normal in all 4  ASSESSMENT: 1. DM2, insulin-dependent, uncontrolled, without complications  2. Hypothyroidism - postablative for Graves ds.  PLAN:  1. Patient with 3 years h/o uncontrolled diabetes, on oral antidiabetic regimen + basal-bolus insulin regimen, now with better control. - I suggested to increase Levemir and restart Metformin.   Patient Instructions  Increase Metformin XR to 1000 mg 2x  a day, with meals. Increase Levemir to 50 units at bedtime. Increase NovoLog rapid acting insulin 15 min before every meal. - 15 units before a smaller meal - 20 units before a regular meal - 25 units before a large meal Please let me know if the sugars are consistently <80 or >200.  Please return in 1 month with your sugar log.   - continue checking sugars at different times of the day - check 2-3 times a day, rotating checks - advised for yearly eye exams >> she is UTD - she had the flu vaccine on 05/27/2014 - she saw nutrition today - refilled Metformin XR - check HbA1c now - Return to clinic in 1.5 mo with sugar log   2. Hypothyroidism - she is taking the thyroid hormone every day, with water, >30 minutes before breakfast, separated by >4 hours from acid reflux medications, calcium, iron, multivitamins. - TSH decreased after moving the Dexilant (now off) >> we decreased the LT4 dose to 100 mcg daily. She feels well on this dose. - will continue the LT4 100 mcg for now - will check TSH, free t4 now  Orders Only on 09/13/2014  Component Date Value Ref Range Status  . TSH 09/13/2014 0.277* 0.350 - 4.500 uIU/mL Final  . Hgb A1c MFr Bld 09/13/2014 12.2* <5.7 % Final   Comment:                                                                        According to the ADA Clinical Practice Recommendations for 2011, when HbA1c is used as a screening test:     >=6.5%   Diagnostic of Diabetes Mellitus            (if abnormal result is confirmed)   5.7-6.4%   Increased risk of developing Diabetes Mellitus   References:Diagnosis and Classification of Diabetes Mellitus,Diabetes ZOXW,9604,54(UJWJX 1):S62-S69 and Standards of Medical Care in         Diabetes - 2011,Diabetes Care,2011,34 (Suppl 1):S11-S61.     . Mean Plasma Glucose 09/13/2014 303* <117 mg/dL Final   TSH still low >> decrease LT4 to 88 mcg daily >> will repeat TFTs when she comes back. HbA1c terrible! Suspect  noncompliance >> will encourage compliance for now.

## 2014-09-14 LAB — T4, FREE: FREE T4: 1.21 ng/dL (ref 0.80–1.80)

## 2014-09-14 LAB — HEMOGLOBIN A1C
HEMOGLOBIN A1C: 12.2 % — AB (ref <5.7)
MEAN PLASMA GLUCOSE: 303 mg/dL — AB (ref <117)

## 2014-09-14 LAB — TSH: TSH: 0.277 u[IU]/mL — ABNORMAL LOW (ref 0.350–4.500)

## 2014-09-14 MED ORDER — LEVOTHYROXINE SODIUM 88 MCG PO TABS: 88.0000 ug | ORAL_TABLET | Freq: Every day | ORAL | Status: DC

## 2014-09-15 ENCOUNTER — Telehealth: Payer: Self-pay | Admitting: *Deleted

## 2014-09-15 ENCOUNTER — Telehealth: Payer: Self-pay | Admitting: Internal Medicine

## 2014-09-15 NOTE — Telephone Encounter (Signed)
Opened encounter in error  

## 2014-09-15 NOTE — Telephone Encounter (Signed)
Pt would like lab results please

## 2014-09-15 NOTE — Telephone Encounter (Signed)
Called pt and advised her of her lab results.

## 2014-09-20 ENCOUNTER — Ambulatory Visit
Admission: RE | Admit: 2014-09-20 | Discharge: 2014-09-20 | Disposition: A | Discharge status: Home / Self Care | Payer: BLUE CROSS/BLUE SHIELD | Source: Ambulatory Visit

## 2014-09-20 DIAGNOSIS — Z1231 Encounter for screening mammogram for malignant neoplasm of breast: Secondary | ICD-10-CM

## 2014-10-28 ENCOUNTER — Ambulatory Visit (INDEPENDENT_AMBULATORY_CARE_PROVIDER_SITE_OTHER): Payer: BLUE CROSS/BLUE SHIELD | Admitting: Internal Medicine

## 2014-10-28 ENCOUNTER — Encounter: Payer: Self-pay | Admitting: Internal Medicine

## 2014-10-28 VITALS — BP 118/74 | HR 107 | Temp 97.7°F | Resp 12 | Wt 194.0 lb

## 2014-10-28 DIAGNOSIS — E1165 Type 2 diabetes mellitus with hyperglycemia: Secondary | ICD-10-CM

## 2014-10-28 DIAGNOSIS — IMO0002 Reserved for concepts with insufficient information to code with codable children: Secondary | ICD-10-CM

## 2014-10-28 DIAGNOSIS — E890 Postprocedural hypothyroidism: Secondary | ICD-10-CM

## 2014-10-28 LAB — T4, FREE: Free T4: 0.79 ng/dL (ref 0.60–1.60)

## 2014-10-28 LAB — TSH: TSH: 2.9 u[IU]/mL (ref 0.35–4.50)

## 2014-10-28 NOTE — Patient Instructions (Signed)
Please stop at the lab.  Continue Metformin XR to 1000 mg 2x a day, with meals. Continue Levemir to 50 units at bedtime. Continue NovoLog rapid acting insulin 15 min before every meal. - 15 units before a smaller meal - 20 units before a regular meal - 25 units before a large meal  Please start working on your diet as we discussed!  Please let me know if the sugars are consistently <80 or >200.  Please return in 1.5 months with your sugar log.

## 2014-10-28 NOTE — Progress Notes (Signed)
Patient ID: Samantha [name], female   DOB: October 03, 1965, 49 y.o.   MRN: [id number]  HPI: Samantha [name] is a 49 y.o.-year-old female, returning for follow-up for DM2, dx 2012, insulin-dependent since 2015, uncontrolled, without complications. Last visit 1.5 ago.  Last hemoglobin A1c was: Lab Results  Component Value Date   HGBA1C 12.2* 09/13/2014  05/27/2014: 12% 02/2014: 9%  She is now on: Metformin XR 1000 mg 2x a day, with meals. Ran out 1 week ago. Levemir to 60 units at bedtime. NovoLog rapid acting insulin 15 min before every meal. - 15 units before a smaller meal - 20 units before a regular meal - 25 units before a large meal She was on Farxiga x 2 years, but this was stopped b/c weight loss (205 >> 179 lbs), urinary frequency. She tried regular metformin >> GI sxs.  [facility].  Pt checks her sugars 2-3x a day and they are still high (but may snack before meals): - am: 282-421 >> 282-365 >> 205-216 >> 124, 163-267, 333 - 2h after b'fast: 282-521 >> 279-404 >> n/c >> 195-240 - before lunch: n/c >> 439 >> 179-216 >> 169-277, 322 - 2h after lunch: n/c >> 242-431 >> n/c >> 160-224 - before dinner: n/c >> 370-514 >> 215-220 >> 140, 197-314 - 2h after dinner: n/c >> 320-407, 547 >> n/c >> 192-248 - bedtime: n/c - nighttime: n/c No lows. Lowest sugar was 124; she has hypoglycemia awareness - ? value  Highest sugar was 500s >> 305 >> 333  Pt's meals are: - Breakfast: cereal or bisquit - Lunch: hamburger, chinese - Dinner: chicken  - Snacks: 2-3  She saw nutritionist. She tells me she would like to eat all the time.  - no CKD, last BUN/creatinine:  11/16/2013: 20/0.92, GFR 79 - Lipid panel not available. On Crestor. - last eye exam was in 05/2014. No DR.  - no numbness and tingling in her feet.  She has a h/o hypothyroidism 2/2 RAI Tx for Graves ds. - She takes LT4 at 7 am. She also has GERD, on Dexilant >> later in the day.   A repeat  set of thyroid tests after separating LT4 from [place] showed a low TSH ( see below), and therefore we decreased her levothyroxine dose to 100 g daily, then to 88 at last visit.   Now off Dexilant.   Reviewed previous TFTs: Lab Results  Component Value Date   TSH 0.277* 09/13/2014   TSH 0.31* 06/24/2014   FREET4 1.21 09/13/2014   FREET4 1.18 06/24/2014  07/28/2013: TSH 1.69  I reviewed pt's medications, allergies, PMH, social hx, family hx, and changes were documented in the history of present illness. Otherwise, unchanged from my initial visit note.  ROS: Constitutional: no weight gain/loss, + fatigue, no subjective hyperthermia  Eyes: no blurry vision, no xerophthalmia ENT: no sore throat, no nodules palpated in throat, no dysphagia/odynophagia Cardiovascular: no CP/SOB/palpitations/leg swelling Respiratory: no cough/SOB Gastrointestinal: + N/no V/+ D/+ C/no GERD Musculoskeletal: + muscle aches/no joint aches Skin: no rashes, + hair loss Neurological: no tremors/numbness/tingling/dizziness, + HA + low libido  PE: BP 118/74 mmHg  Pulse 107  Temp(Src) 97.7 F (36.5 C) (Oral)  Resp 12  Wt 194 lb (87.998 kg)  SpO2 96%  LMP 07/10/2012 Body mass index is 32.28 kg/(m^2).  Wt Readings from Last 3 Encounters:  10/28/14 194 lb (87.998 kg)  09/13/14 182 lb 12.8 oz (82.918 kg)  09/13/14 181 lb (82.101 kg)  Constitutional: overweight, in NAD Eyes: PERRLA, EOMI, no exophthalmos ENT: moist mucous membranes, no thyromegaly, no cervical lymphadenopathy Cardiovascular: RRR, No MRG Respiratory: CTA B Gastrointestinal: abdomen soft, NT, ND, BS+ Musculoskeletal: no deformities, strength intact in all 4 Skin: moist, warm, no rashes Neurological: no tremor with outstretched hands, DTR normal in all 4  ASSESSMENT: 1. DM2, insulin-dependent, uncontrolled, without complications  2. Hypothyroidism - postablative for Graves ds.  PLAN:  1. Patient with uncontrolled diabetes, on oral  antidiabetic regimen + basal-bolus insulin regimen, with still poor control. She snacks all day at work - unhealthy snacks so we discussed about considering these "off Limits". I suggested to either increase insulin doses or to add back a SGLT2 inh. But she refuses and would like to start working on her diet first. - I suggested to:  Patient Instructions  Please stop at the lab.  Continue Metformin XR to 1000 mg 2x a day, with meals. Continue Levemir to 50 units at bedtime. Continue NovoLog rapid acting insulin 15 min before every meal. - 15 units before a smaller meal - 20 units before a regular meal - 25 units before a large meal  Please start working on your diet as we discussed!  Please let me know if the sugars are consistently <80 or >200.  Please return in 1.5 months with your sugar log.  - continue checking sugars at different times of the day - check 2-3 times a day, rotating checks - advised for yearly eye exams >> she is UTD - she had the flu vaccine on 05/27/2014 - she saw nutrition  - Return to clinic in 1.5 mo with sugar log   2. Hypothyroidism - she is taking the thyroid hormone every day, with water, >30 minutes before breakfast, separated by >4 hours from acid reflux medications, calcium, iron, multivitamins. - TSH decreased after moving the Dexilant (now off) >> we decreased the LT4 dose to 100 mcg daily. She feels well on this dose. - will continue the LT4 100 mcg for now - will check TSH, free t4 now  2. Hypothyroidism - we decreased the LT4 to 88 at last visit - recheck TFTs today  Office Visit on 10/28/2014  Component Date Value Ref Range Status  . TSH 10/28/2014 2.90  0.35 - 4.50 uIU/mL Final  . Free T4 10/28/2014 0.79  0.60 - 1.60 ng/dL Final   Lab normal.

## 2014-11-25 ENCOUNTER — Other Ambulatory Visit (HOSPITAL_COMMUNITY)
Admission: RE | Admit: 2014-11-25 | Discharge: 2014-11-25 | Disposition: A | Discharge status: Home / Self Care | Payer: BLUE CROSS/BLUE SHIELD | Source: Ambulatory Visit | Attending: Nurse Practitioner | Admitting: Nurse Practitioner

## 2014-11-25 ENCOUNTER — Other Ambulatory Visit: Payer: Self-pay | Admitting: Nurse Practitioner

## 2014-11-25 DIAGNOSIS — Z01419 Encounter for gynecological examination (general) (routine) without abnormal findings: Secondary | ICD-10-CM | POA: Insufficient documentation

## 2014-11-25 DIAGNOSIS — Z1151 Encounter for screening for human papillomavirus (HPV): Secondary | ICD-10-CM | POA: Insufficient documentation

## 2014-11-29 LAB — CYTOLOGY - PAP

## 2014-12-17 ENCOUNTER — Ambulatory Visit: Payer: BLUE CROSS/BLUE SHIELD | Admitting: Internal Medicine

## 2015-01-24 ENCOUNTER — Telehealth: Payer: Self-pay | Admitting: Internal Medicine

## 2015-01-24 MED ORDER — LEVOTHYROXINE SODIUM 88 MCG PO TABS: 88.0000 ug | ORAL_TABLET | Freq: Every day | ORAL | Status: DC

## 2015-01-24 NOTE — Telephone Encounter (Signed)
Patient need refill of levothroxine

## 2015-01-25 ENCOUNTER — Telehealth: Payer: Self-pay | Admitting: Internal Medicine

## 2015-01-25 NOTE — Telephone Encounter (Signed)
Blood sugar is running high this AM it was 297  Please advise

## 2015-01-25 NOTE — Telephone Encounter (Signed)
Can go up by 10 units for now and the 10 more if this is not enough.

## 2015-01-25 NOTE — Telephone Encounter (Signed)
Called pt and advised her per Dr Arman Filter message. Pt voiced understanding. Will call in a few days and let us know how her sugars are doing. Be advised.

## 2015-01-25 NOTE — Telephone Encounter (Signed)
Called pt and asked her about her blood sugar. Pt stated that her blood sugar has been around 297-300 in the AM for a few weeks. Pt has had some high 200 readings at lunch. Pt stated that she missed her last appt b/c she felt that Dr Cruzita Lederer would be disappointed in her numbers. Advised pt that she needs to advise Dr Cruzita Lederer of her blood sugars so that she can adjust her medication to help bring them into normal range. Pt seemed to understand. Please advise if there is any medication changes that need to be made. Thank you.

## 2015-01-25 NOTE — Telephone Encounter (Signed)
She needs an appt! For now: Continue Metformin XR to 1000 mg 2x a day, with meals. Increase Levemir to 60 units at bedtime. Continue NovoLog rapid acting insulin 15 min before every meal. - 15 units before a smaller meal - 20 units before a regular meal - 25 units before a large meal  DO NOT MISS DOSES.

## 2015-01-25 NOTE — Telephone Encounter (Signed)
Called pt and advised her per Dr Arman Filter result note. Pt stated that she has an appt for June 7th. Also, she is already doing 60 units of Levemir at bedtime. Please advise.

## 2015-02-15 ENCOUNTER — Encounter: Payer: Self-pay | Admitting: Internal Medicine

## 2015-02-15 ENCOUNTER — Ambulatory Visit (INDEPENDENT_AMBULATORY_CARE_PROVIDER_SITE_OTHER): Payer: BLUE CROSS/BLUE SHIELD | Admitting: Internal Medicine

## 2015-02-15 VITALS — BP 114/62 | HR 94 | Temp 97.6°F | Resp 12 | Wt 198.0 lb

## 2015-02-15 DIAGNOSIS — E890 Postprocedural hypothyroidism: Secondary | ICD-10-CM

## 2015-02-15 DIAGNOSIS — E1165 Type 2 diabetes mellitus with hyperglycemia: Secondary | ICD-10-CM

## 2015-02-15 DIAGNOSIS — IMO0002 Reserved for concepts with insufficient information to code with codable children: Secondary | ICD-10-CM

## 2015-02-15 LAB — HEMOGLOBIN A1C: HEMOGLOBIN A1C: 11.3 % — AB (ref 4.6–6.5)

## 2015-02-15 MED ORDER — INSULIN DETEMIR 100 UNIT/ML FLEXPEN
70.0000 [IU] | PEN_INJECTOR | Freq: Every day | SUBCUTANEOUS | Status: DC
Start: 2015-02-15 — End: 2015-11-28

## 2015-02-15 MED ORDER — INSULIN ASPART 100 UNIT/ML FLEXPEN: 20.0000 [IU] | PEN_INJECTOR | Freq: Three times a day (TID) | SUBCUTANEOUS | Status: DC

## 2015-02-15 MED ORDER — CANAGLIFLOZIN 100 MG PO TABS
100.0000 mg | ORAL_TABLET | Freq: Every day | ORAL | Status: DC
Start: 2015-02-15 — End: 2015-06-14

## 2015-02-15 MED ORDER — METFORMIN HCL ER 500 MG PO TB24: 1000.0000 mg | ORAL_TABLET | Freq: Two times a day (BID) | ORAL | Status: DC

## 2015-02-15 NOTE — Progress Notes (Signed)
Patient ID: Samantha [name], female   DOB: 10/12/1965, 49 y.o.   MRN: [id number]  HPI: Samantha [name] is a 49 y.o.-year-old female, returning for follow-up for DM2, dx 2012, insulin-dependent since 2015, uncontrolled, without complications. Last visit 4 ago.  Last hemoglobin A1c was: Lab Results  Component Value Date   HGBA1C 12.2* 09/13/2014  05/27/2014: 12% 02/2014: 9%  She is now on: Metformin XR 1000 mg 2x a day, with meals - skipped doses - ands only taking 1000 mg at night!!!!??? Levemir 70 units at bedtime - no usually skipping doses NovoLog 15 min before every meal. May skip lunch doses. - 15 units before a smaller meal - 20 units before a regular meal - 25 units before a large meal She was on Farxiga x 2 years, but this was stopped b/c weight loss (205 >> 179 lbs), urinary frequency. She tried regular metformin >> GI sxs.  Pt checks her sugars 2-3x a day and they are still high (but may snack before meals): - am: 282-421 >> 282-365 >> 205-216 >> 124, 163-267, 333 >> 207-300, 369 - 2h after b'fast: 282-521 >> 279-404 >> n/c >> 195-240 >> n/c - before lunch: n/c >> 439 >> 179-216 >> 169-277, 322 >> 207-330 - 2h after lunch: n/c >> 242-431 >> n/c >> 160-224 >> n/c - before dinner: n/c >> 370-514 >> 215-220 >> 140, 197-314 >> 236-314 - 2h after dinner: n/c >> 320-407, 547 >> n/c >> 192-248 >> 534 - bedtime: n/c - nighttime: n/c No lows. Lowest sugar was 124; she has hypoglycemia awareness - ? value  Highest sugar was 500s >> 305 >> 333 >> 534  Pt's meals are: - Breakfast: cereal or bisquit - Lunch: hamburger, chinese - Dinner: chicken  - Snacks: 2-3  She saw nutritionist. She tells me she would like to eat all the time.  - no CKD, last BUN/creatinine:  11/16/2013: 20/0.92, GFR 79 - Lipid panel not available. On Crestor. - last eye exam was in 05/2014. No DR.  - no numbness and tingling in her feet.  She has a h/o hypothyroidism 2/2 RAI Tx for Graves ds. -  She takes LT4 at 7 am. She also has GERD, on Dexilant >> later in the day.   A repeat set of thyroid tests after separating LT4 from [place] showed a low TSH ( see below), and therefore we decreased her levothyroxine dose to 100 g daily, then to 88. Now off Dexilant.   Reviewed previous TFTs: Lab Results  Component Value Date   TSH 2.90 10/28/2014   TSH 0.277* 09/13/2014   TSH 0.31* 06/24/2014   FREET4 0.79 10/28/2014   FREET4 1.21 09/13/2014   FREET4 1.18 06/24/2014  07/28/2013: TSH 1.69  I reviewed pt's medications, allergies, PMH, social hx, family hx, and changes were documented in the history of present illness. Otherwise, unchanged from my initial visit note.  ROS: Constitutional: no weight gain/loss, + fatigue, no subjective hyperthermia  Eyes: no blurry vision, no xerophthalmia ENT: no sore throat, no nodules palpated in throat, no dysphagia/odynophagia Cardiovascular: no CP/SOB/palpitations/leg swelling Respiratory: no cough/SOB Gastrointestinal: + N/no V/+ D/+ C/no GERD Musculoskeletal: + muscle aches/no joint aches Skin: no rashes Neurological: no tremors/numbness/tingling/dizziness  PE: BP 114/62 mmHg  Pulse 94  Temp(Src) 97.6 F (36.4 C) (Oral)  Resp 12  Wt 198 lb (89.812 kg)  SpO2 96%  LMP 07/10/2012 Body mass index is 32.95 kg/(m^2).  Wt Readings from Last 3 Encounters:  02/15/15 198 lb (89.812  kg)  10/28/14 194 lb (87.998 kg)  09/13/14 182 lb 12.8 oz (82.918 kg)   Constitutional: overweight, in NAD Eyes: PERRLA, EOMI, no exophthalmos ENT: moist mucous membranes, no thyromegaly, no cervical lymphadenopathy Cardiovascular: RRR, No MRG Respiratory: CTA B Gastrointestinal: abdomen soft, NT, ND, BS+ Musculoskeletal: no deformities, strength intact in all 4 Skin: moist, warm, no rashes Neurological: no tremor with outstretched hands, DTR normal in all 4  ASSESSMENT: 1. DM2, insulin-dependent, uncontrolled, without complications  2.  Hypothyroidism - postablative for Graves ds.  PLAN:  1. Patient with uncontrolled diabetes, on oral antidiabetic regimen + basal-bolus insulin regimen, with still poor control - poor compliance with medication, diet, visits and keeping a CBG log. She still snacks all day at work. Will increase mealtime insulin, increase Metformin and add Invokana. - I suggested to:  Patient Instructions  Please increase: - Metformin XR 1000 mg 2x a day  Continue: - Levemir 70 units at bedtime  Increase: NovoLog 15 min before every meal.  - 20 units before a smaller meal - 25 units before a regular meal - 30 units before a large meal  Please add: - Farxiga 10 mg daily in am  Please stop at the lab.  Please come back for a follow-up appointment in 3 months  - continue checking sugars at different times of the day - check 2-3 times a day, rotating checks - advised for yearly eye exams >> she is UTD - she saw nutrition  - check A1C today - Return to clinic in 3 mo with sugar log   2. Hypothyroidism - she is taking the thyroid hormone every day, with water, >30 minutes before breakfast, separated by >4 hours from acid reflux medications, calcium, iron, multivitamins. - TSH decreased after moving the Dexilant (now off) >> we decreased the LT4 dose to 100, then to 88 mcg daily. She feels well on this dose. - will continue the LT4 88 mcg for now - will check TSH, free t4 at next visit  Office Visit on 02/15/2015  Component Date Value Ref Range Status  . Hgb A1c MFr Bld 02/15/2015 11.3* 4.6 - 6.5 % Final   Glycemic Control Guidelines for People with Diabetes:Non Diabetic:  <6%Goal of Therapy: <7%Additional Action Suggested:  >8%   HbA1c still very high.

## 2015-02-15 NOTE — Patient Instructions (Addendum)
Please increase: - Metformin XR 1000 mg 2x a day  Continue: - Levemir 70 units at bedtime  Increase: NovoLog 15 min before every meal.  - 20 units before a smaller meal - 25 units before a regular meal - 30 units before a large meal  Please add: - Invokana 100 mg daily in am  Please stop at the lab.  Please come back for a follow-up appointment in 3 months

## 2015-02-21 ENCOUNTER — Encounter: Payer: Self-pay | Admitting: *Deleted

## 2015-03-05 ENCOUNTER — Other Ambulatory Visit: Payer: Self-pay | Admitting: Internal Medicine

## 2015-03-24 ENCOUNTER — Other Ambulatory Visit: Payer: Self-pay | Admitting: *Deleted

## 2015-03-24 MED ORDER — INSULIN GLARGINE 100 UNIT/ML SOLOSTAR PEN: 70.0000 [IU] | PEN_INJECTOR | Freq: Every day | SUBCUTANEOUS | Status: DC

## 2015-03-24 NOTE — Telephone Encounter (Signed)
Ins required a PA for Levemir. Switching to Lantus Solostar to see if in will cover for pt.

## 2015-03-28 ENCOUNTER — Telehealth: Payer: Self-pay | Admitting: Internal Medicine

## 2015-03-28 NOTE — Telephone Encounter (Signed)
Called pt and advised her that her ins required a PA for the levemir. We switched to Lantus due to ins coverage. Pt voiced understanding.

## 2015-03-28 NOTE — Telephone Encounter (Signed)
Pt calling for clarification on meds she says she usually uses levemir but it appears lantus was called in. Please advise

## 2015-04-28 ENCOUNTER — Other Ambulatory Visit: Payer: Self-pay | Admitting: Internal Medicine

## 2015-05-19 ENCOUNTER — Ambulatory Visit (INDEPENDENT_AMBULATORY_CARE_PROVIDER_SITE_OTHER): Payer: BLUE CROSS/BLUE SHIELD | Admitting: Internal Medicine

## 2015-05-19 ENCOUNTER — Encounter: Payer: Self-pay | Admitting: Internal Medicine

## 2015-05-19 ENCOUNTER — Other Ambulatory Visit (INDEPENDENT_AMBULATORY_CARE_PROVIDER_SITE_OTHER): Payer: BLUE CROSS/BLUE SHIELD

## 2015-05-19 VITALS — BP 110/68 | HR 93 | Temp 98.1°F | Resp 12 | Wt 204.0 lb

## 2015-05-19 DIAGNOSIS — E890 Postprocedural hypothyroidism: Secondary | ICD-10-CM

## 2015-05-19 DIAGNOSIS — IMO0002 Reserved for concepts with insufficient information to code with codable children: Secondary | ICD-10-CM

## 2015-05-19 DIAGNOSIS — E1165 Type 2 diabetes mellitus with hyperglycemia: Secondary | ICD-10-CM

## 2015-05-19 LAB — T4, FREE: Free T4: 0.71 ng/dL (ref 0.60–1.60)

## 2015-05-19 LAB — TSH: TSH: 11.19 u[IU]/mL — ABNORMAL HIGH (ref 0.35–4.50)

## 2015-05-19 LAB — HEMOGLOBIN A1C: Hgb A1c MFr Bld: 8.9 % — ABNORMAL HIGH (ref 4.6–6.5)

## 2015-05-19 MED ORDER — LEVOTHYROXINE SODIUM 100 MCG PO TABS: 100.0000 ug | ORAL_TABLET | Freq: Every day | ORAL | Status: DC

## 2015-05-19 NOTE — Progress Notes (Signed)
Patient ID: Samantha [name], female   DOB: 30-Oct-1965, 49 y.o.   MRN: [id number]  HPI: Samantha [name] is a 49 y.o.-year-old female, returning for follow-up for DM2, dx 2012, insulin-dependent since 2015, uncontrolled, without complications. Last visit 3 ago.  Last hemoglobin A1c was: Lab Results  Component Value Date   HGBA1C 11.3* 02/15/2015   HGBA1C 12.2* 09/13/2014  05/27/2014: 12% 02/2014: 9%  She is now on: Metformin XR 1000 mg 2x a day, with meals Levemir >> Lantus 70 units at bedtime - no usually skipping doses NovoLog 15 min before every meal.  - 15 units before a smaller meal - 20 units before a regular meal - 25 units before a large meal - Invokana 100 mg in am - restarted 02/2015 She was on Farxiga x 2 years, but this was stopped b/c weight loss (205 >> 179 lbs), urinary frequency She tried regular metformin >> GI sxs.  Pt checks her sugars 2-3x a day and they are still high (but may snack before meals): - am: 282-421 >> 282-365 >> 205-216 >> 124, 163-267, 333 >> 207-300, 369 >> 90s-170 - 2h after b'fast: 282-521 >> 279-404 >> n/c >> 195-240 >> n/c - before lunch: n/c >> 439 >> 179-216 >> 169-277, 322 >> 207-330 >> 91, 120-160 - 2h after lunch: n/c >> 242-431 >> n/c >> 160-224 >> n/c - before dinner: n/c >> 370-514 >> 215-220 >> 140, 197-314 >> 236-314 >> 139 - 2h after dinner: n/c >> 320-407, 547 >> n/c >> 192-248 >> 534 >> n/c - bedtime: n/c - nighttime: n/c No lows. Lowest sugar was 124; she has hypoglycemia awareness - ? value  Highest sugar was 500s >> 305 >> 333 >> 534 >> 180 in last 1 mo  Pt's meals are: - Breakfast: cereal or bisquit - Lunch: hamburger, chinese - Dinner: chicken  - Snacks: 2-3  She saw nutritionist. She tells me she would like to eat all the time.  - no CKD, last BUN/creatinine:  02/10/2015: 13/0.83, GFR 86 11/16/2013: 20/0.92, GFR 79 - Lipid panel: 06/02/201^: 185/168/44/107 On Crestor. - last eye exam was in 05/2014. No DR.   - no numbness and tingling in her feet.  She has a h/o hypothyroidism 2/2 RAI Tx for Graves ds. - She takes LT4 at 7 am. She also has GERD, was on Dexilant. A repeat set of thyroid tests after separating LT4 from [place] showed a low TSH ( see below), and therefore we decreased her levothyroxine dose to 100 g daily, then to 88. Now off Dexilant >> will restart.  Reviewed previous TFTs: Lab Results  Component Value Date   TSH 2.90 10/28/2014   TSH 0.277* 09/13/2014   TSH 0.31* 06/24/2014   FREET4 0.79 10/28/2014   FREET4 1.21 09/13/2014   FREET4 1.18 06/24/2014  07/28/2013: TSH 1.69  I reviewed pt's medications, allergies, PMH, social hx, family hx, and changes were documented in the history of present illness. Otherwise, unchanged from my initial visit note.  ROS: Constitutional: no weight gain/loss, no fatigue, no subjective hyperthermia  Eyes: no blurry vision, no xerophthalmia ENT: no sore throat, no nodules palpated in throat, no dysphagia/odynophagia Cardiovascular: no CP/SOB/palpitations/leg swelling Respiratory: no cough/SOB Gastrointestinal: no N/V/D/C/GERD Musculoskeletal: + muscle aches/+ joint aches Skin: no rashes Neurological: no tremors/numbness/tingling/dizziness  PE: BP 110/68 mmHg  Pulse 93  Temp(Src) 98.1 F (36.7 C) (Oral)  Resp 12  Wt 204 lb (92.534 kg)  SpO2 97%  LMP 07/10/2012 Body mass index is 33.95  kg/(m^2).  Wt Readings from Last 3 Encounters:  05/19/15 204 lb (92.534 kg)  02/15/15 198 lb (89.812 kg)  10/28/14 194 lb (87.998 kg)   Constitutional: overweight, in NAD Eyes: PERRLA, EOMI, no exophthalmos ENT: moist mucous membranes, no thyromegaly, no cervical lymphadenopathy Cardiovascular: RRR, No MRG Respiratory: CTA B Gastrointestinal: abdomen soft, NT, ND, BS+ Musculoskeletal: no deformities, strength intact in all 4 Skin: moist, warm, no rashes Neurological: no tremor with outstretched hands, DTR normal in all 4  ASSESSMENT: 1.  DM2, insulin-dependent, uncontrolled, without complications  2. Hypothyroidism - postablative for Graves ds.  PLAN:  1. Patient with uncontrolled diabetes, on oral antidiabetic regimen + basal-bolus insulin regimen, with much improved control after increasing Metformin and adding Invokana.  - I suggested to:  Patient Instructions  Please continue: - Metformin XR 1000 mg 2x a day - Levemir 70 units at bedtime - NovoLog 15 min before every meal.  - 15 units before a smaller meal - 20 units before a regular meal - 25 units before a large meal - Invokana 100 mg daily in am  Please check sugars at bedtime. If these are high, > 180, please increase Novolog with dinner by 3-5 units. Please stop at the lab.  Please come back for a follow-up appointment in 3 months  [place]!!!  - continue checking sugars at different times of the day - check 2-3 times a day, rotating checks - advised for yearly eye exams >> she is UTD - she saw nutrition  - check A1C today - Return to clinic in 3 mo with sugar log   2. Hypothyroidism - she is taking the thyroid hormone every day, with water, >30 minutes before breakfast, separated by >4 hours from acid reflux medications, calcium, iron, multivitamins. - will restart Dexilant (now off) >> advised to take it with lunch. May need to increase LT4 dose if TSH increases. - will continue the LT4 88 mcg for now  - will check TSH, free t4 today  Appointment on 05/19/2015  Component Date Value Ref Range Status  . Hgb A1c MFr Bld 05/19/2015 8.9* 4.6 - 6.5 % Final   Glycemic Control Guidelines for People with Diabetes:Non Diabetic:  <6%Goal of Therapy: <7%Additional Action Suggested:  >8%   . TSH 05/19/2015 11.19* 0.35 - 4.50 uIU/mL Final  . Free T4 05/19/2015 0.71  0.60 - 1.60 ng/dL Final   HbA1c much better! TSH high >> will underline compliance and increase the dose to 100, also as she is preparing to start [place]. Recheck TFTs at next  visit.

## 2015-05-19 NOTE — Patient Instructions (Signed)
Patient Instructions  Please continue: - Metformin XR 1000 mg 2x a day - Levemir 70 units at bedtime - NovoLog 15 min before every meal.  - 15 units before a smaller meal - 20 units before a regular meal - 25 units before a large meal - Invokana 100 mg daily in am  Please check sugars at bedtime. If these are high, > 180, please increase Novolog with dinner by 3-5 units. Please stop at the lab.  Please come back for a follow-up appointment in 3 months  [place]!!!

## 2015-06-14 ENCOUNTER — Other Ambulatory Visit: Payer: Self-pay | Admitting: Internal Medicine

## 2015-07-23 ENCOUNTER — Other Ambulatory Visit: Payer: Self-pay | Admitting: Internal Medicine

## 2015-08-15 ENCOUNTER — Other Ambulatory Visit: Payer: Self-pay | Admitting: *Deleted

## 2015-08-15 MED ORDER — METFORMIN HCL ER 500 MG PO TB24: 1000.0000 mg | ORAL_TABLET | Freq: Two times a day (BID) | ORAL | Status: DC

## 2015-08-18 ENCOUNTER — Other Ambulatory Visit: Payer: Self-pay | Admitting: *Deleted

## 2015-08-18 ENCOUNTER — Encounter: Payer: Self-pay | Admitting: Internal Medicine

## 2015-08-18 ENCOUNTER — Other Ambulatory Visit (INDEPENDENT_AMBULATORY_CARE_PROVIDER_SITE_OTHER): Payer: BLUE CROSS/BLUE SHIELD | Admitting: *Deleted

## 2015-08-18 ENCOUNTER — Ambulatory Visit (INDEPENDENT_AMBULATORY_CARE_PROVIDER_SITE_OTHER): Payer: BLUE CROSS/BLUE SHIELD | Admitting: Internal Medicine

## 2015-08-18 VITALS — BP 104/60 | HR 97 | Temp 97.8°F | Resp 12 | Wt 202.0 lb

## 2015-08-18 DIAGNOSIS — IMO0001 Reserved for inherently not codable concepts without codable children: Secondary | ICD-10-CM

## 2015-08-18 DIAGNOSIS — E890 Postprocedural hypothyroidism: Secondary | ICD-10-CM | POA: Diagnosis not present

## 2015-08-18 DIAGNOSIS — E1165 Type 2 diabetes mellitus with hyperglycemia: Secondary | ICD-10-CM | POA: Diagnosis not present

## 2015-08-18 LAB — POCT GLYCOSYLATED HEMOGLOBIN (HGB A1C): HEMOGLOBIN A1C: 8.5

## 2015-08-18 MED ORDER — INSULIN ASPART 100 UNIT/ML FLEXPEN: 20.0000 [IU] | PEN_INJECTOR | Freq: Three times a day (TID) | SUBCUTANEOUS | Status: DC

## 2015-08-18 MED ORDER — GLUCOSE BLOOD VI STRP: ORAL_STRIP | Status: DC

## 2015-08-18 MED ORDER — METFORMIN HCL ER 500 MG PO TB24: 1000.0000 mg | ORAL_TABLET | Freq: Two times a day (BID) | ORAL | Status: DC

## 2015-08-18 NOTE — Patient Instructions (Signed)
Patient Instructions  Please continue: - Metformin XR 1000 mg 2x a day - Levemir 70 units at bedtime - NovoLog 15 min before every meal.  - 15 units before a smaller meal - 20 units before a regular meal - 25 units before a large meal - Invokana 100 mg daily in am  Please come back for a follow-up appointment in 3 months  Take the thyroid hormone every day, with water, >30 minutes before breakfast, separated by >4 hours from: - acid reflux medications - calcium - iron - multivitamins.

## 2015-08-18 NOTE — Progress Notes (Signed)
Patient ID: Samantha [name], female   DOB: [birth year], 49 y.o.   MRN: [id number]  HPI: Samantha [name] is a 49 y.o.-year-old female, returning for follow-up for DM2, dx 2012, insulin-dependent since 2015, uncontrolled, without complications. Last visit 3 ago.  She is stressed - mother with dementia, living with her.  She fell off the wagon with diet and insulin doses during Thanksgiving.  Last hemoglobin A1c was: Lab Results  Component Value Date   HGBA1C 8.9* 05/19/2015   HGBA1C 11.3* 02/15/2015   HGBA1C 12.2* 09/13/2014  05/27/2014: 12% 02/2014: 9%  She is now on: - Metformin XR 1000 mg 2x a day, with meals (ran out for last 2 days) - Invokana 100 mg in am - restarted 02/2015 - Levemir >> Lantus 70 units at bedtime - no usually skipping doses - NovoLog 15 min before every meal - may skip doses.  (ran out for last 2 weeks) - 15 units before a smaller meal - 20 units before a regular meal - 25 units before a large meal She was on Farxiga x 2 years, but this was stopped b/c weight loss (205 >> 179 lbs), urinary frequency She tried regular metformin >> GI sxs.  Pt checks her sugars 2-3x a day and they are higher in last month: - am: 282-421 >> 282-365 >> 205-216 >> 124, 163-267, 333 >> 207-300, 369 >> 90s-170 >> 190-252 - 2h after b'fast: 282-521 >> 279-404 >> n/c >> 195-240 >> n/c - before lunch: n/c >> 439 >> 179-216 >> 169-277, 322 >> 207-330 >> 91, 120-160 >> n/c - 2h after lunch: n/c >> 242-431 >> n/c >> 160-224 >> n/c - before dinner: n/c >> 370-514 >> 215-220 >> 140, 197-314 >> 236-314 >> 139 >> n/c - 2h after dinner: n/c >> 320-407, 547 >> n/c >> 192-248 >> 534 >> n/c >> 254 - bedtime: n/c - nighttime: n/c No lows. Lowest sugar was 124; she has hypoglycemia awareness - ? value  Highest sugar was 500s >> 305 >> 333 >> 534 >> 180 in last 1 mo  Pt's meals are: - Breakfast: cereal or bisquit - Lunch: hamburger, chinese - Dinner: chicken  - Snacks: 2-3  She saw  nutritionist. She tells me she would like to eat all the time.  - no CKD, last BUN/creatinine:  02/10/2015: 13/0.83, GFR 86 11/16/2013: 20/0.92, GFR 79 - Lipid panel: 02/10/2015: 185/168/44/107 On Crestor. - last eye exam was in 05/2014. No DR.  - no numbness and tingling in her feet.  She has a h/o hypothyroidism 2/2 RAI Tx for Graves ds. - She takes LT4 at 7 am. She also has GERD, was on Dexilant. We increased the dose of LT4 to 100 mcg daily after last visit. She did not miss doses.  Reviewed previous TFTs: Lab Results  Component Value Date   TSH 11.19* 05/19/2015   TSH 2.90 10/28/2014   TSH 0.277* 09/13/2014   TSH 0.31* 06/24/2014   FREET4 0.71 05/19/2015   FREET4 0.79 10/28/2014   FREET4 1.21 09/13/2014   FREET4 1.18 06/24/2014  07/28/2013: TSH 1.69  I reviewed pt's medications, allergies, PMH, social hx, family hx, and changes were documented in the history of present illness. Otherwise, unchanged from my initial visit note.  ROS: Constitutional: no weight gain/loss, no fatigue, no subjective hyperthermia  Eyes: no blurry vision, no xerophthalmia ENT: no sore throat, no nodules palpated in throat, no dysphagia/odynophagia Cardiovascular: no CP/SOB/palpitations/leg swelling Respiratory: no cough/SOB Gastrointestinal: no N/V/D/C/GERD Musculoskeletal: no muscle aches/joint  aches Skin: no rashes Neurological: no tremors/numbness/tingling/dizziness  PE: BP 104/60 mmHg  Pulse 97  Temp(Src) 97.8 F (36.6 C) (Oral)  Resp 12  Wt 202 lb (91.627 kg)  SpO2 92%  LMP 07/10/2012 Body mass index is 33.61 kg/(m^2).  Wt Readings from Last 3 Encounters:  08/18/15 202 lb (91.627 kg)  05/19/15 204 lb (92.534 kg)  02/15/15 198 lb (89.812 kg)   Constitutional: overweight, in NAD Eyes: PERRLA, EOMI, no exophthalmos ENT: moist mucous membranes, no thyromegaly, no cervical lymphadenopathy Cardiovascular: RRR, No MRG Respiratory: CTA B Gastrointestinal: abdomen soft, NT, ND,  BS+ Musculoskeletal: no deformities, strength intact in all 4 Skin: moist, warm, no rashes Neurological: no tremor with outstretched hands, DTR normal in all 4  ASSESSMENT: 1. DM2, insulin-dependent, uncontrolled, without complications  2. Hypothyroidism - postablative for Graves ds.  PLAN:  1. Patient with uncontrolled diabetes, on oral antidiabetic regimen + basal-bolus insulin regimen, with much improved control at last visit, now much worse as she has been missing insulin and metformin doses and eating more. Refilled her meds and strongly advised her to restart taking meds consistently. Will not change regimen for now. - I suggested to:  Patient Instructions  Please continue: - Metformin XR 1000 mg 2x a day - Levemir 70 units at bedtime - NovoLog 15 min before every meal.  - 15 units before a smaller meal - 20 units before a regular meal - 25 units before a large meal - Invokana 100 mg daily in am  Please come back for a follow-up appointment in 1-1.5 months  Take the thyroid hormone every day, with water, >30 minutes before breakfast, separated by >4 hours from: - acid reflux medications - calcium - iron - multivitamins.  - continue checking sugars at different times of the day - check 2-3 times a day, rotating checks - advised for yearly eye exams >> she is UTD - check A1C today >> 8.5% (better) - Return to clinic in 1-1.5 mo with sugar log   2. Hypothyroidism - she is taking the thyroid hormone with water, >30 minutes before breakfast, separated by >4 hours from acid reflux medications, calcium, iron, multivitamins. However, was not consistent with taking LT4 in last 3 mo. - will continue the LT4 100 mcg for now  - will check TSH, free t4 at next visit

## 2015-09-21 ENCOUNTER — Telehealth: Payer: Self-pay | Admitting: Internal Medicine

## 2015-09-21 MED ORDER — INSULIN PEN NEEDLE 32G X 4 MM MISC: Status: DC

## 2015-09-21 NOTE — Telephone Encounter (Signed)
Refill has been sent to pts pharmacy.

## 2015-09-21 NOTE — Telephone Encounter (Signed)
Patient would like a refill sent to her pharmacy   Rx: Pen needles   Pharmacy: [facility]    Thank you

## 2015-09-22 ENCOUNTER — Telehealth: Payer: Self-pay | Admitting: Internal Medicine

## 2015-09-22 MED ORDER — ONETOUCH LANCETS MISC: Status: DC

## 2015-09-22 NOTE — Telephone Encounter (Signed)
Patient Name: Samantha [name] Gender: Female DOB: May 09, 1966 Age: 50 Y 51 M 20 [name] Return Phone Number: [phone] (Primary) Address: City/State/Zip: [place] Client [facility] Night - Client Client Site [facility] Contact Type Call Call Type Triage / Clinical Relationship To Patient Self Return Phone Number [phone] (Primary) Chief Complaint Prescription Refill or Medication Request (non symptomatic) Initial Comment Caller states her refill wasn't sent into the pharmacy. Nurse Assessment Nurse: Samantha Mares, RN, Samantha [name] Date/Time (Eastern Time): 09/21/2015 5:33:51 PM Please select the assessment type ---Refill Additional Documentation ---Caller states her refill wasn't sent into the pharmacy. Reports that she needs lancets for her one touch meter. Does the patient have enough medication to last until the office opens? ---Unable to obtain loaner dose from Pharmacy Does the client directives allow for assistance with medications after hours? ---Yes Was the medication filled within the last 6 months? ---Yes What is the name of the medication, dose and instructions as listed on the bottle? ---lancets for One Touch Name of the physician as listed on the bottle. ---Dr. Philemon Kingdom, MD Pharmacy name and phone number where most recently filled. ---[facility] [phone] Additional Documentation ---Advised that nurse will call this refill in for her. Guidelines Guideline Title Affirmed Question Affirmed Notes Nurse Date/Time (

## 2015-10-12 ENCOUNTER — Other Ambulatory Visit: Payer: Self-pay | Admitting: Internal Medicine

## 2015-10-12 ENCOUNTER — Ambulatory Visit: Payer: BLUE CROSS/BLUE SHIELD | Admitting: Internal Medicine

## 2015-11-07 ENCOUNTER — Telehealth: Payer: Self-pay | Admitting: Internal Medicine

## 2015-11-07 NOTE — Telephone Encounter (Signed)
Contour next test strips called into [facility] on [address]

## 2015-11-08 MED ORDER — GLUCOSE BLOOD VI STRP: ORAL_STRIP | Status: DC

## 2015-11-28 ENCOUNTER — Ambulatory Visit (INDEPENDENT_AMBULATORY_CARE_PROVIDER_SITE_OTHER): Payer: BLUE CROSS/BLUE SHIELD | Admitting: Internal Medicine

## 2015-11-28 ENCOUNTER — Encounter: Payer: Self-pay | Admitting: Internal Medicine

## 2015-11-28 ENCOUNTER — Other Ambulatory Visit: Payer: Self-pay | Admitting: Internal Medicine

## 2015-11-28 VITALS — BP 120/72 | HR 78 | Temp 97.7°F | Ht 66.0 in | Wt 200.2 lb

## 2015-11-28 DIAGNOSIS — E890 Postprocedural hypothyroidism: Secondary | ICD-10-CM

## 2015-11-28 DIAGNOSIS — E1165 Type 2 diabetes mellitus with hyperglycemia: Secondary | ICD-10-CM | POA: Diagnosis not present

## 2015-11-28 DIAGNOSIS — IMO0001 Reserved for inherently not codable concepts without codable children: Secondary | ICD-10-CM

## 2015-11-28 LAB — POCT GLYCOSYLATED HEMOGLOBIN (HGB A1C): HEMOGLOBIN A1C: 9.8

## 2015-11-28 MED ORDER — INSULIN ASPART 100 UNIT/ML FLEXPEN: 20.0000 [IU] | PEN_INJECTOR | Freq: Three times a day (TID) | SUBCUTANEOUS | Status: DC

## 2015-11-28 NOTE — Patient Instructions (Signed)
Please continue: - Metformin XR 1000 mg 2x a day - Invokana 100 mg daily in am - Levemir 70 units at bedtime  - NovoLog 15 min before every meal - 15 units before a smaller meal - 20 units before a regular meal  Take the thyroid hormone every day, with water, at least 30 minutes before breakfast, separated by at least 4 hours from: - acid reflux medications - calcium - iron - multivitamins

## 2015-11-28 NOTE — Progress Notes (Signed)
Patient ID: Samantha [name], female   DOB: [birth year], 50 y.o.   MRN: [id number]  HPI: Samantha [name] is a 50 y.o.-year-old female, returning for follow-up for DM2, dx 2012, insulin-dependent since 2015, uncontrolled, without complications. Last visit 3 months ago.  She is stressed - mother with dementia, living with her.  Last hemoglobin A1c was: Lab Results  Component Value Date   HGBA1C 8.5 08/18/2015   HGBA1C 8.9* 05/19/2015   HGBA1C 11.3* 02/15/2015  05/27/2014: 12% 02/2014: 9%  She is on - she tells me she has not been compliant with insulin: - Metformin XR 1000 mg 2x a day - Invokana 100 mg daily in am - Levemir 70 units at bedtime  - NovoLog 15 min before every meal - out of this x 1 week - did not call for refill.  - 15 units before a smaller meal - 20 units before a regular meal - 25 units before a large meal She was on Farxiga x 2 years, but this was stopped b/c weight loss (205 >> 179 lbs), urinary frequency She tried regular metformin >> GI sxs.  Pt checks her sugars 1-2x a day (no meter, no log): - am: 282-421 >> 282-365 >> 205-216 >> 124, 163-267, 333 >> 207-300, 369 >> 90s-170 >> 190-252 >> 140-180, 220 - 2h after b'fast: 282-521 >> 279-404 >> n/c >> 195-240 >> n/c - before lunch: n/c >> 439 >> 179-216 >> 169-277, 322 >> 207-330 >> 91, 120-160 >> n/c >> 120-140 - 2h after lunch: n/c >> 242-431 >> n/c >> 160-224 >> n/c - before dinner: n/c >> 370-514 >> 215-220 >> 140, 197-314 >> 236-314 >> 139 >> n/c - 2h after dinner: n/c >> 320-407, 547 >> n/c >> 192-248 >> 534 >> n/c >> 254 >> 190 - bedtime: n/c - nighttime: n/c No lows. Lowest sugar was 124 >> 120 >> 88; she has hypoglycemia awareness - ? value  Highest sugar was 500s >> 305 >> 333 >> 534 >> 180 in last 1 mo >> 300s.  Pt's meals are: - Breakfast: cereal or bisquit - Lunch: hamburger, chinese - Dinner: chicken  - Snacks: 2-3  She saw nutritionist. She tells me she would like to eat all the  time.  - no CKD, last BUN/creatinine:  02/10/2015: 13/0.83, GFR 86 11/16/2013: 20/0.92, GFR 79 - Lipid panel: 02/10/2015: 185/168/44/107 On Crestor. - last eye exam was in 09/2015. No DR. [employer] at [employer]. - no numbness and tingling in her feet.  She has a h/o hypothyroidism 2/2 RAI Tx for Graves ds. - She takes LT4 at 7 am - at last visit, we separated this from PPIs >> she now stopped PPIs.  On LT4 100 mcg daily after last visit. She tells me she is skipping doses (for example, she did not take it at all over the weekend!).   Reviewed previous TFTs: Lab Results  Component Value Date   TSH 11.19* 05/19/2015   TSH 2.90 10/28/2014   TSH 0.277* 09/13/2014   TSH 0.31* 06/24/2014   FREET4 0.71 05/19/2015   FREET4 0.79 10/28/2014   FREET4 1.21 09/13/2014   FREET4 1.18 06/24/2014  07/28/2013: TSH 1.69  I reviewed pt's medications, allergies, PMH, social hx, family hx, and changes were documented in the history of present illness. Otherwise, unchanged from my initial visit note.  ROS: Constitutional: no weight gain/loss, no fatigue, no subjective hyperthermia  Eyes: no blurry vision, no xerophthalmia ENT: no sore throat, no nodules palpated in throat, no  dysphagia/odynophagia Cardiovascular: no CP/SOB/palpitations/leg swelling Respiratory: no cough/SOB Gastrointestinal: no N/V/D/C/GERD Musculoskeletal: no muscle aches/joint aches Skin: no rashes Neurological: no tremors/numbness/tingling/dizziness  PE: BP 120/72 mmHg  Pulse 78  Temp(Src) 97.7 F (36.5 C) (Oral)  Ht 5\' 6"  (1.676 m)  Wt 200 lb 4 oz (90.833 kg)  BMI 32.34 kg/m2  LMP 07/10/2012 Body mass index is 32.34 kg/(m^2).  Wt Readings from Last 3 Encounters:  11/28/15 200 lb 4 oz (90.833 kg)  08/18/15 202 lb (91.627 kg)  05/19/15 204 lb (92.534 kg)   Constitutional: overweight, in NAD Eyes: PERRLA, EOMI, no exophthalmos ENT: moist mucous membranes, no thyromegaly, no cervical  lymphadenopathy Cardiovascular: RRR, No MRG Respiratory: CTA B Gastrointestinal: abdomen soft, NT, ND, BS+ Musculoskeletal: no deformities, strength intact in all 4 Skin: moist, warm, no rashes Neurological: no tremor with outstretched hands, DTR normal in all 4  ASSESSMENT: 1. DM2, insulin-dependent, uncontrolled, without complications  2. Hypothyroidism - postablative for Graves ds.  PLAN:  1. Patient with uncontrolled diabetes, on oral antidiabetic regimen + basal-bolus insulin regimen, with still poor DM control due to missing insulin and metformin doses and eating irregularly. She ran out of NovoLog again a week ago. At last visit, she had ran out for 2 weeks before her appointment. She cannot explain why she is running out, since she has refills. Even if she has the medicines, she may forget doses of either insulin or metformin for approximately half of the time. I explained that her diabetes is poorly controlled, and we'll continue to do so if she does not start taking medicines as advised. I also advised her that if she is not starting to make an effort and take the medicines, I would not be able to help her. She promises me that she was started taking the medicines by next visit - Refilled her NovoLog. Will not change regimen for now. - I suggested to:  Patient Instructions  Please continue: - Metformin XR 1000 mg 2x a day - Invokana 100 mg daily in am - Levemir 70 units at bedtime  - NovoLog 15 min before every meal - 15 units before a smaller meal - 20 units before a regular meal  Take the thyroid hormone every day, with water, at least 30 minutes before breakfast, separated by at least 4 hours from: - acid reflux medications - calcium - iron - multivitamins  - continue checking sugars at different times of the day - check 2-3 times a day, rotating checks - advised for yearly eye exams >> she is UTD - check A1C today >> 9.0% (higher) - Return to clinic in 3 mo with  sugar log   2. Hypothyroidism - she is taking the thyroid hormone with water, >30 minutes before breakfast, separated by >4 hours from acid reflux medications, calcium, iron, multivitamins. However,She misses many doses, and there is no point in rechecking her TFTs for now. She promised that she will also start taking this consistently before next visit. - will continue the LT4 100 mcg for now  - will check TSH, free t4 at next visit

## 2015-11-29 MED ORDER — ONETOUCH LANCETS MISC: Status: DC

## 2015-11-29 NOTE — Telephone Encounter (Signed)
Pt needs [place] to call in the novolog and the pen needles for the bayer contour

## 2015-11-29 NOTE — Addendum Note (Signed)
Addended by: Rockie Neighbours B on: 11/29/2015 01:41 PM   Modules accepted: Orders

## 2015-11-30 ENCOUNTER — Other Ambulatory Visit: Payer: Self-pay | Admitting: *Deleted

## 2015-12-01 ENCOUNTER — Telehealth: Payer: Self-pay | Admitting: Internal Medicine

## 2015-12-01 MED ORDER — METFORMIN HCL ER 500 MG PO TB24: 1000.0000 mg | ORAL_TABLET | Freq: Two times a day (BID) | ORAL | Status: DC

## 2015-12-01 NOTE — Telephone Encounter (Signed)
PT needs Metformin Refilled sent to [facility]

## 2015-12-06 ENCOUNTER — Other Ambulatory Visit: Payer: Self-pay | Admitting: *Deleted

## 2015-12-06 MED ORDER — BASAGLAR KWIKPEN 100 UNIT/ML ~~LOC~~ SOPN: 70.0000 [IU] | PEN_INJECTOR | Freq: Every day | SUBCUTANEOUS | Status: DC

## 2015-12-07 ENCOUNTER — Other Ambulatory Visit: Payer: Self-pay | Admitting: *Deleted

## 2015-12-07 MED ORDER — INSULIN GLARGINE 100 UNIT/ML SOLOSTAR PEN: 70.0000 [IU] | PEN_INJECTOR | Freq: Every day | SUBCUTANEOUS | Status: DC

## 2015-12-07 NOTE — Telephone Encounter (Signed)
Ins will not cover Basaglar. Sending in Lantus per Dr Cruzita Lederer.

## 2016-02-11 ENCOUNTER — Other Ambulatory Visit: Payer: Self-pay | Admitting: Internal Medicine

## 2016-02-27 ENCOUNTER — Ambulatory Visit (INDEPENDENT_AMBULATORY_CARE_PROVIDER_SITE_OTHER): Payer: BLUE CROSS/BLUE SHIELD | Admitting: Internal Medicine

## 2016-02-27 ENCOUNTER — Encounter: Payer: Self-pay | Admitting: Internal Medicine

## 2016-02-27 VITALS — BP 122/78 | HR 84 | Ht 66.0 in | Wt 194.6 lb

## 2016-02-27 DIAGNOSIS — E890 Postprocedural hypothyroidism: Secondary | ICD-10-CM

## 2016-02-27 DIAGNOSIS — IMO0001 Reserved for inherently not codable concepts without codable children: Secondary | ICD-10-CM

## 2016-02-27 DIAGNOSIS — E1165 Type 2 diabetes mellitus with hyperglycemia: Secondary | ICD-10-CM | POA: Diagnosis not present

## 2016-02-27 LAB — POCT GLYCOSYLATED HEMOGLOBIN (HGB A1C): Hemoglobin A1C: 6.7

## 2016-02-27 LAB — TSH: TSH: 4.47 u[IU]/mL (ref 0.35–4.50)

## 2016-02-27 LAB — T4, FREE: Free T4: 0.61 ng/dL (ref 0.60–1.60)

## 2016-02-27 MED ORDER — METFORMIN HCL ER 500 MG PO TB24: 1000.0000 mg | ORAL_TABLET | Freq: Two times a day (BID) | ORAL | Status: DC

## 2016-02-27 NOTE — Progress Notes (Signed)
Patient ID: Samantha [name], female   DOB: [birth year], 50 y.o.   MRN: [id number]  HPI: Samantha [name] is a 50 y.o.-year-old female, returning for follow-up for DM2, dx 2012, insulin-dependent since 2015, uncontrolled, without complications. Last visit 3 months ago.  She is stressed - mother with dementia, living with her.  Last hemoglobin A1c was: Lab Results  Component Value Date   HGBA1C 9.8 11/28/2015   HGBA1C 8.5 08/18/2015   HGBA1C 8.9* 05/19/2015  05/27/2014: 12% 02/2014: 9%  She is on the following regimen - but not compliant with insulin: - Metformin XR 1000 mg 2x a day - Invokana 100 mg daily in am - Levemir 70 units at bedtime  - NovoLog 15 min before every meal - out of this x 1 week - did not call for refill.  - 15 units before a smaller meal - 20 units before a regular meal - 25 units before a large meal She was on Farxiga x 2 years, but this was stopped b/c weight loss (205 >> 179 lbs), urinary frequency She tried regular metformin >> GI sxs.  Pt checks her sugars 1-2x a day (no meter, no log): - am: 282-365 >> 205-216 >> 124, 163-267, 333 >> 207-300, 369 >> 90s-170 >> 190-252 >> 140-180, 220 >> 69-148, 165 - 2h after b'fast: 282-521 >> 279-404 >> n/c >> 195-240 >> n/c - before lunch: n/c >> 439 >> 179-216 >> 169-277, 322 >> 207-330 >> 91, 120-160 >> n/c >> 120-140 >> 63-145, 188 - 2h after lunch: n/c >> 242-431 >> n/c >> 160-224 >> n/c >> 197 - before dinner: n/c >> 370-514 >> 215-220 >> 140, 197-314 >> 236-314 >> 139 >> n/c >> 93-145, 152 - 2h after dinner: n/c >> 320-407, 547 >> n/c >> 192-248 >> 534 >> n/c >> 254 >> 190 >> 99 - bedtime: n/c>> 164-189, 240 - nighttime: n/c No lows. Lowest sugar was 124 >> 120 >> 88 >> 67; she has hypoglycemia awareness - ? value  Highest sugar was 500s >> 305 >> 333 >> 534 >> 180 in last 1 mo >> 300s >> 200s.  Pt's meals are: - Breakfast: cereal or bisquit - Lunch: hamburger, chinese - Dinner: chicken  - Snacks:  2-3  - no CKD, last BUN/creatinine:  No results found for: BUN, CREATININE  02/10/2015: 13/0.83, GFR 86 11/16/2013: 20/0.92, GFR 79 - Lipid panel: No results found for: CHOL, HDL, LDLCALC, LDLDIRECT, TRIG, CHOLHDL 02/10/2015: 185/168/44/107 On Crestor. - last eye exam was in 09/2015. No DR. [employer] at [facility]. - no numbness and tingling in her feet.  She has a h/o hypothyroidism 2/2 RAI Tx for Graves ds. - She takes LT4 at 7 am - at last visit, we separated this from PPIs >> she is now off PPIs.  On LT4 100 mcg daily after last visit. She tells me she is skipping doses (for example, she did not take it at all over the weekend!).   Reviewed previous TFTs: Lab Results  Component Value Date   TSH 11.19* 05/19/2015   TSH 2.90 10/28/2014   TSH 0.277* 09/13/2014   TSH 0.31* 06/24/2014   FREET4 0.71 05/19/2015   FREET4 0.79 10/28/2014   FREET4 1.21 09/13/2014   FREET4 1.18 06/24/2014  07/28/2013: TSH 1.69  I reviewed pt's medications, allergies, PMH, social hx, family hx, and changes were documented in the history of present illness. Otherwise, unchanged from my initial visit note.  ROS: Constitutional: + weight loss, no fatigue, no subjective  hyperthermia  Eyes: no blurry vision, no xerophthalmia ENT: no sore throat, no nodules palpated in throat, no dysphagia/odynophagia Cardiovascular: no CP/SOB/palpitations/leg swelling Respiratory: no cough/SOB Gastrointestinal: no N/V/D/C/GERD Musculoskeletal: no muscle aches/joint aches Skin: no rashes Neurological: no tremors/numbness/tingling/dizziness  PE: BP 122/78 mmHg  Pulse 84  Ht 5\' 6"  (1.676 m)  Wt 194 lb 9.6 oz (88.27 kg)  BMI 31.42 kg/m2  SpO2 98%  LMP 07/10/2012 Body mass index is 31.42 kg/(m^2).  Wt Readings from Last 3 Encounters:  02/27/16 194 lb 9.6 oz (88.27 kg)  11/28/15 200 lb 4 oz (90.833 kg)  08/18/15 202 lb (91.627 kg)   Constitutional: overweight, in NAD Eyes: PERRLA, EOMI, no exophthalmos ENT:  moist mucous membranes, no thyromegaly, no cervical lymphadenopathy Cardiovascular: RRR, No MRG Respiratory: CTA B Gastrointestinal: abdomen soft, NT, ND, BS+ Musculoskeletal: no deformities, strength intact in all 4 Skin: moist, warm, no rashes Neurological: no tremor with outstretched hands, DTR normal in all 4  ASSESSMENT: 1. DM2, insulin-dependent, uncontrolled, without complications  2. Hypothyroidism - postablative for Graves ds.  PLAN:  1. Patient with uncontrolled diabetes, on oral antidiabetic regimen + basal-bolus insulin regimen, with previously poor DM control due to missing insulin and metformin doses and eating irregularly. She was also running out of insulins frequently. At last visit, we had a long discussion about the fact that if she is not starting to make an effort and take the medicines, I would not be able to help her. At this visit, her sugars are dramatically better based on her detailed log, she also lost 6 pounds and feeling much better. - Will not change regimen for now. - I suggested to:  Patient Instructions  Please continue: - Metformin XR 1000 mg 2x a day - Invokana 100 mg daily in am - Levemir 70 units at bedtime  - NovoLog 15 min before every meal - 15 units before a smaller meal - 20 units before a regular meal  - continue checking sugars at different times of the day - check 2-3 times a day, rotating checks - advised for yearly eye exams >> she is UTD - needs CMP and Lipids at next visit. - checked HbA1c today >> 6.7% (excellent decrease!) - Return to clinic in 3 mo with sugar log   2. Hypothyroidism - she is taking the thyroid hormone with water, >30 minutes before breakfast, separated by >4 hours from acid reflux medications, calcium, iron, multivitamins.In the past she was missing many doses, and we also discussed about this at last visit, explaining the consequences of uncontrolled hypothyroidism. At this visit, she tells me that she only  missed 3 doses since last visit. - will continue the LT4 100 mcg for now  - will check TSH, free t4 today  Office Visit on 02/27/2016  Component Date Value Ref Range Status  . TSH 02/27/2016 4.47  0.35 - 4.50 uIU/mL Final  . Free T4 02/27/2016 0.61  0.60 - 1.60 ng/dL Final  . Hemoglobin A1C 02/27/2016 6.7   Final   Normal TFTs!

## 2016-02-27 NOTE — Patient Instructions (Addendum)
Please continue: - Metformin XR 1000 mg 2x a day - Invokana 100 mg daily in am - Levemir 70 units at bedtime  - NovoLog 15 min before every meal - 15 units before a smaller meal - 20 units before a regular meal  Take the thyroid hormone every day, with water, at least 30 minutes before breakfast, separated by at least 4 hours from: - acid reflux medications - calcium - iron - multivitamins  Please stop at the lab.  KEEP UP THE GOOD WORK!

## 2016-02-28 ENCOUNTER — Telehealth: Payer: Self-pay | Admitting: Internal Medicine

## 2016-02-28 NOTE — Telephone Encounter (Signed)
Called patient. No answer, and no voicemail to leave message with. Will try again later.

## 2016-02-29 ENCOUNTER — Telehealth: Payer: Self-pay

## 2016-02-29 NOTE — Telephone Encounter (Signed)
Called patient today, no answer. No voicemail to leave a message. Will try again later.

## 2016-03-02 ENCOUNTER — Telehealth: Payer: Self-pay

## 2016-03-02 NOTE — Telephone Encounter (Signed)
Called patient. No answer. No voicemail to leave message with. Will try again later to read results.

## 2016-03-06 ENCOUNTER — Telehealth: Payer: Self-pay

## 2016-03-06 ENCOUNTER — Telehealth: Payer: Self-pay | Admitting: Internal Medicine

## 2016-03-06 NOTE — Telephone Encounter (Signed)
PT calling inquiring about test results, CB# [phone]

## 2016-03-06 NOTE — Telephone Encounter (Signed)
Called patient. Gave normal lab results. No questions or concerns.

## 2016-03-09 ENCOUNTER — Telehealth: Payer: Self-pay

## 2016-03-09 NOTE — Telephone Encounter (Signed)
Still no answer from either phone numbers on chart. Voicemail box was full, no messages were able to be left. Will try again.

## 2016-03-09 NOTE — Telephone Encounter (Signed)
Tried calling patient on both number listed. No answer or voicemail to leave message. Will continue to try.

## 2016-03-27 ENCOUNTER — Other Ambulatory Visit: Payer: Self-pay | Admitting: Family Medicine

## 2016-03-27 DIAGNOSIS — Z1231 Encounter for screening mammogram for malignant neoplasm of breast: Secondary | ICD-10-CM

## 2016-04-03 ENCOUNTER — Ambulatory Visit
Admission: RE | Admit: 2016-04-03 | Discharge: 2016-04-03 | Disposition: A | Discharge status: Home / Self Care | Payer: BLUE CROSS/BLUE SHIELD | Source: Ambulatory Visit | Attending: Family Medicine | Admitting: Family Medicine

## 2016-04-03 DIAGNOSIS — Z1231 Encounter for screening mammogram for malignant neoplasm of breast: Secondary | ICD-10-CM

## 2016-04-08 ENCOUNTER — Other Ambulatory Visit: Payer: Self-pay | Admitting: Internal Medicine

## 2016-04-19 ENCOUNTER — Encounter: Payer: Self-pay | Admitting: Family Medicine

## 2016-04-20 NOTE — Progress Notes (Signed)
I attempted to reach patient with lab results, no answer. I sent patient a letter with lab results enclosed in.

## 2016-04-24 ENCOUNTER — Other Ambulatory Visit: Payer: Self-pay | Admitting: Internal Medicine

## 2016-05-14 ENCOUNTER — Other Ambulatory Visit: Payer: Self-pay | Admitting: Internal Medicine

## 2016-05-21 ENCOUNTER — Other Ambulatory Visit: Payer: Self-pay | Admitting: Internal Medicine

## 2016-05-29 ENCOUNTER — Ambulatory Visit: Payer: BLUE CROSS/BLUE SHIELD | Admitting: Internal Medicine

## 2016-06-01 ENCOUNTER — Ambulatory Visit: Payer: BLUE CROSS/BLUE SHIELD | Admitting: Podiatry

## 2016-06-14 ENCOUNTER — Other Ambulatory Visit: Payer: Self-pay | Admitting: Internal Medicine

## 2016-07-05 ENCOUNTER — Other Ambulatory Visit: Payer: Self-pay | Admitting: Internal Medicine

## 2016-07-19 ENCOUNTER — Ambulatory Visit: Payer: BLUE CROSS/BLUE SHIELD | Admitting: Internal Medicine

## 2016-07-23 ENCOUNTER — Telehealth: Payer: Self-pay | Admitting: Internal Medicine

## 2016-07-23 MED ORDER — CANAGLIFLOZIN 100 MG PO TABS: ORAL_TABLET | ORAL | 0 refills | Status: DC

## 2016-07-23 MED ORDER — LEVOTHYROXINE SODIUM 100 MCG PO TABS: 100.0000 ug | ORAL_TABLET | Freq: Every day | ORAL | 0 refills | Status: DC

## 2016-07-23 NOTE — Telephone Encounter (Signed)
Refills submitted.  

## 2016-07-23 NOTE — Telephone Encounter (Signed)
Patient need a refill of NVOKANA 100 MG TABS tablet send to   [id number] - , [place] - [phone] (Phone) [phone] (Fax)      levothyroxine (SYNTHROID, LEVOTHROID) 100 MCG tablet  Send to  [place], [place] - [address] [phone] (Phone) [phone] (Fax)

## 2016-07-26 ENCOUNTER — Other Ambulatory Visit: Payer: Self-pay | Admitting: Internal Medicine

## 2016-08-20 ENCOUNTER — Encounter: Payer: Self-pay | Admitting: Internal Medicine

## 2016-08-20 ENCOUNTER — Ambulatory Visit (INDEPENDENT_AMBULATORY_CARE_PROVIDER_SITE_OTHER): Payer: 59 | Admitting: Internal Medicine

## 2016-08-20 VITALS — BP 112/78 | HR 88 | Temp 97.8°F | Ht 66.0 in | Wt 195.6 lb

## 2016-08-20 DIAGNOSIS — E1165 Type 2 diabetes mellitus with hyperglycemia: Secondary | ICD-10-CM

## 2016-08-20 DIAGNOSIS — E890 Postprocedural hypothyroidism: Secondary | ICD-10-CM

## 2016-08-20 DIAGNOSIS — IMO0001 Reserved for inherently not codable concepts without codable children: Secondary | ICD-10-CM

## 2016-08-20 DIAGNOSIS — Z23 Encounter for immunization: Secondary | ICD-10-CM | POA: Diagnosis not present

## 2016-08-20 LAB — POCT GLYCOSYLATED HEMOGLOBIN (HGB A1C): Hemoglobin A1C: 8.2

## 2016-08-20 MED ORDER — ONETOUCH LANCETS MISC: 5 refills | Status: DC

## 2016-08-20 MED ORDER — GLUCOSE BLOOD VI STRP
1.0000 | ORAL_STRIP | Freq: Three times a day (TID) | 5 refills | Status: DC
Start: 2016-08-20 — End: 2016-11-27

## 2016-08-20 NOTE — Progress Notes (Signed)
Patient ID: Samantha [name], female   DOB: August 30, 1966, 50 y.o.   MRN: [id number]  HPI: Samantha [name] is a 50 y.o.-year-old female, returning for follow-up for DM2, dx 2012, insulin-dependent since 2015, uncontrolled, without complications. Last visit 3 months ago.  She is stressed - mother with dementia, living with her. She also lost her job since last visit >> now working for [name].  Last hemoglobin A1c was: Lab Results  Component Value Date   HGBA1C 6.7 02/27/2016   HGBA1C 9.8 11/28/2015   HGBA1C 8.5 08/18/2015  05/27/2014: 12% 02/2014: 9%  She is on the following regimen: - Metformin XR 1000 mg 2x a day - Invokana 100 mg daily in am - Levemir 70 units at bedtime  - NovoLog 15 min before every meal - 15 units before a smaller meal - 20 units before a regular meal - 25 units before a large meal She was on Farxiga x 2 years, but this was stopped b/c weight loss (205 >> 179 lbs), urinary frequency She tried regular metformin >> GI sxs.  Pt checks her sugars 1-2x a day (no meter, no log) >> higher: - am: 124, 163-267, 333 >> 207-300, 369 >> 90s-170 >> 190-252 >> 140-180, 220 >> 69-148, 165 >> 105-143, 230 - 2h after b'fast: 282-521 >> 279-404 >> n/c >> 195-240 >> n/c - before lunch:169-277, 322 >> 207-330 >> 91, 120-160 >> n/c >> 120-140 >> 63-145, 188 >> n/c - 2h after lunch: n/c >> 242-431 >> n/c >> 160-224 >> n/c >> 197 - before dinner: n/c >> 370-514 >> 215-220 >> 140, 197-314 >> 236-314 >> 139 >> n/c >> 93-145, 152 >> 160-190 - 2h after dinner: n/c >> 320-407, 547 >> n/c >> 192-248 >> 534 >> n/c >> 254 >> 190 >> 99 >> n/c - bedtime: n/c>> 164-189, 240 >> n/c - nighttime: n/c No lows. Lowest sugar was 67 >> 105; she has hypoglycemia awareness - ? value  Highest sugar was 200s >> 230.  Pt's meals are: - Breakfast: cereal or bisquit - Lunch: hamburger, chinese - Dinner: chicken  - Snacks: 2-3  - no CKD, last BUN/creatinine:  No results found for: BUN,  CREATININE  02/10/2015: 13/0.83, GFR 86 11/16/2013: 20/0.92, GFR 79 - Lipid panel: No results found for: CHOL, HDL, LDLCALC, LDLDIRECT, TRIG, CHOLHDL 02/10/2015: 185/168/44/107 On Crestor. - last eye exam was in 09/2015. No DR. [facility] at [facility]. - no numbness and tingling in her feet.  She has a h/o hypothyroidism 2/2 RAI Tx for Graves ds. - She takes LT4 at 7 am, no PPIs; eats 30 min later.  On LT4 100 mcg daily. Reviewed previous TFTs: Lab Results  Component Value Date   TSH 4.47 02/27/2016   TSH 11.19 (H) 05/19/2015   TSH 2.90 10/28/2014   TSH 0.277 (L) 09/13/2014   TSH 0.31 (L) 06/24/2014   FREET4 0.61 02/27/2016   FREET4 0.71 05/19/2015   FREET4 0.79 10/28/2014   FREET4 1.21 09/13/2014   FREET4 1.18 06/24/2014  07/28/2013: TSH 1.69  I reviewed pt's medications, allergies, PMH, social hx, family hx, and changes were documented in the history of present illness. Otherwise, unchanged from my initial visit note.  ROS: Constitutional: no weight gain/loss, no fatigue, no subjective hyperthermia  Eyes: no blurry vision, no xerophthalmia ENT: no sore throat, no nodules palpated in throat, no dysphagia/odynophagia Cardiovascular: no CP/SOB/palpitations/leg swelling Respiratory: no cough/SOB Gastrointestinal: no N/V/D/C/GERD Musculoskeletal: no muscle aches/joint aches Skin: no rashes Neurological: no tremors/numbness/tingling/dizziness  PE: BP  112/78 (BP Location: Left Arm, Patient Position: Sitting, Cuff Size: Large)   Pulse 88   Temp 97.8 F (36.6 C) (Oral)   Ht 5\' 6"  (1.676 m)   Wt 195 lb 9.6 oz (88.7 kg)   LMP 07/10/2012   SpO2 97%   BMI 31.57 kg/m  Body mass index is 31.57 kg/m.  Wt Readings from Last 3 Encounters:  08/20/16 195 lb 9.6 oz (88.7 kg)  02/27/16 194 lb 9.6 oz (88.3 kg)  11/28/15 200 lb 4 oz (90.8 kg)   Constitutional: overweight, in NAD Eyes: PERRLA, EOMI, no exophthalmos ENT: moist mucous membranes, no thyromegaly, no cervical  lymphadenopathy Cardiovascular: RRR, No MRG Respiratory: CTA B Gastrointestinal: abdomen soft, NT, ND, BS+ Musculoskeletal: no deformities, strength intact in all 4 Skin: moist, warm, no rashes Neurological: no tremor with outstretched hands, DTR normal in all 4  ASSESSMENT: 1. DM2, insulin-dependent, uncontrolled, without complications  2. Hypothyroidism - postablative for Graves ds.  PLAN:  1. Patient with uncontrolled diabetes, on oral antidiabetic regimen + basal-bolus insulin regimen, with previously poor DM control due to missing insulin and metformin doses and eating irregularly. She was also running out of insulins frequently. She started to be compliant with DM meds before last visit >> her HbA1c has greatly improved. However, her control deteriorated again since last visit as she had multiple stressors >> she is now back on track >> will not change regimen. - Will not change regimen for now. - I suggested to:  Patient Instructions  Please continue: - Metformin XR 1000 mg 2x a day - Invokana 100 mg daily in am - Levemir 70 units at bedtime  - NovoLog 15 min before every meal - 15 units before a smaller meal - 20 units before a regular meal  - continue checking sugars at different times of the day - check 2-3 times a day, rotating checks - advised for yearly eye exams >> she is UTD - CMP and Lipids checked by PCP >> need to obtain - checked HbA1c today >> 8.2% (higher) - Return to clinic in 3 mo with sugar log   2. Hypothyroidism - last TSH, free t4 obtained at last visit >> normal - she is taking the thyroid hormone with water, >30 minutes before breakfast, separated by >4 hours from acid reflux medications, calcium, iron, multivitamins.In the past she was missing many doses, but now takes it every day. - will continue the LT4 100 mcg for now   Philemon Kingdom, MD PhD [facility] End[facility]

## 2016-08-20 NOTE — Patient Instructions (Addendum)
Please continue: - Metformin XR 1000 mg 2x a day - Invokana 100 mg daily in am - Levemir 70 units at bedtime  - NovoLog 15 min before every meal - 15 units before a smaller meal - 20 units before a regular meal - 25 units before a large meal  Please continue Levothyroxine 100 mcg daily.  Take the thyroid hormone every day, with water, at least 30 minutes before breakfast, separated by at least 4 hours from: - acid reflux medications - calcium - iron - multivitamins  Please come back for a follow-up appointment in 3 months.

## 2016-08-22 ENCOUNTER — Other Ambulatory Visit: Payer: Self-pay | Admitting: Internal Medicine

## 2016-08-22 NOTE — Telephone Encounter (Signed)
Pt needs her test strips to go along with the lancets sent to [facility] on [address].

## 2016-08-22 NOTE — Telephone Encounter (Signed)
Called patient and advised Rx for glucose blood (ONETOUCH VERIO) test strip was E scribed to pharmacy on file and confirmation was received on 08/20/16 @ 8:49am.  Patient states she will contact the pharmacy.

## 2016-10-04 ENCOUNTER — Telehealth: Payer: Self-pay | Admitting: Internal Medicine

## 2016-10-04 ENCOUNTER — Other Ambulatory Visit: Payer: Self-pay

## 2016-10-04 MED ORDER — CANAGLIFLOZIN 100 MG PO TABS: ORAL_TABLET | ORAL | 0 refills | Status: DC

## 2016-10-04 NOTE — Telephone Encounter (Signed)
invokana needs a PA please

## 2016-10-04 NOTE — Telephone Encounter (Signed)
Submitted PA today. Will await fax back with answer.

## 2016-10-09 ENCOUNTER — Other Ambulatory Visit: Payer: Self-pay

## 2016-10-09 MED ORDER — EMPAGLIFLOZIN 25 MG PO TABS: 25.0000 mg | ORAL_TABLET | Freq: Every day | ORAL | 3 refills | Status: DC

## 2016-10-22 ENCOUNTER — Telehealth: Payer: Self-pay | Admitting: Internal Medicine

## 2016-10-22 NOTE — Telephone Encounter (Signed)
Patient stated her b/s was 573, she want to know if she should take the invokana or the Jardiance or both. Please advise

## 2016-10-23 ENCOUNTER — Telehealth: Payer: Self-pay

## 2016-10-23 ENCOUNTER — Other Ambulatory Visit: Payer: Self-pay | Admitting: Internal Medicine

## 2016-10-23 NOTE — Telephone Encounter (Signed)
Called patient, she is taking steroid at this time, I advised of Dr.Gherghe's note and notified her of the new medication. I advised patient to call us if she needed [place]. Blood sugar was down to 178 this am. Started taking invokana.

## 2016-10-23 NOTE — Telephone Encounter (Signed)
Called patient about sugars. She states she is feeling better, her sugars were 178 this am. Patient stated she was taking a steroid prednisone, and has two days left. I advised patient of her note to take the Novolog, but wouldn notify her if you wanted to change anything else.

## 2016-10-23 NOTE — Telephone Encounter (Signed)
Along with previous message; patient also received her invokana which she started taking and it brought her sugars down as well.

## 2016-10-23 NOTE — Telephone Encounter (Signed)
Please advise. Thank you

## 2016-10-23 NOTE — Telephone Encounter (Signed)
She can continue to take whichever she has at home. We have to go with what is covered by her insurance. Please drink plenty of water. However, for now, take 15 units of NovoLog to try to bring the sugars down. Check sugars again in an hour to see if they came down. We will need to probably increase her insulin doses if she continues to have high blood sugars. Did she have a steroid shot or why does she think her sugars are so high?

## 2016-10-26 ENCOUNTER — Other Ambulatory Visit: Payer: Self-pay

## 2016-10-26 MED ORDER — SERTRALINE HCL 100 MG PO TABS: 100.0000 mg | ORAL_TABLET | Freq: Every day | ORAL | 0 refills | Status: DC

## 2016-10-26 MED ORDER — INSULIN PEN NEEDLE 32G X 4 MM MISC: 5 refills | Status: DC

## 2016-10-26 MED ORDER — ROSUVASTATIN CALCIUM 5 MG PO TABS: 5.0000 mg | ORAL_TABLET | Freq: Every day | ORAL | 0 refills | Status: DC

## 2016-10-29 ENCOUNTER — Other Ambulatory Visit: Payer: Self-pay | Admitting: Internal Medicine

## 2016-11-08 ENCOUNTER — Other Ambulatory Visit: Payer: Self-pay | Admitting: Internal Medicine

## 2016-11-09 ENCOUNTER — Other Ambulatory Visit: Payer: Self-pay | Admitting: Internal Medicine

## 2016-11-12 ENCOUNTER — Other Ambulatory Visit: Payer: Self-pay

## 2016-11-12 ENCOUNTER — Other Ambulatory Visit: Payer: Self-pay | Admitting: Internal Medicine

## 2016-11-12 MED ORDER — INSULIN ASPART 100 UNIT/ML FLEXPEN: PEN_INJECTOR | SUBCUTANEOUS | 3 refills | Status: DC

## 2016-11-12 NOTE — Telephone Encounter (Signed)
Patient called the [facility] stating she is out of her diabetic medication she did not say what medication was needed.

## 2016-11-13 ENCOUNTER — Other Ambulatory Visit: Payer: Self-pay

## 2016-11-13 MED ORDER — INSULIN LISPRO 100 UNIT/ML (KWIKPEN): PEN_INJECTOR | SUBCUTANEOUS | 3 refills | Status: DC

## 2016-11-15 ENCOUNTER — Other Ambulatory Visit: Payer: Self-pay

## 2016-11-15 ENCOUNTER — Telehealth: Payer: Self-pay | Admitting: Internal Medicine

## 2016-11-15 DIAGNOSIS — IMO0001 Reserved for inherently not codable concepts without codable children: Secondary | ICD-10-CM

## 2016-11-15 DIAGNOSIS — E1165 Type 2 diabetes mellitus with hyperglycemia: Principal | ICD-10-CM

## 2016-11-15 MED ORDER — INSULIN GLARGINE 100 UNIT/ML SOLOSTAR PEN: 70.0000 [IU] | PEN_INJECTOR | Freq: Every day | SUBCUTANEOUS | 2 refills | Status: DC

## 2016-11-15 MED ORDER — LEVOTHYROXINE SODIUM 100 MCG PO TABS: 100.0000 ug | ORAL_TABLET | Freq: Every day | ORAL | 0 refills | Status: DC

## 2016-11-15 MED ORDER — ONETOUCH LANCETS MISC: 5 refills | Status: DC

## 2016-11-15 MED ORDER — INSULIN LISPRO 100 UNIT/ML (KWIKPEN): PEN_INJECTOR | SUBCUTANEOUS | 3 refills | Status: DC

## 2016-11-15 MED ORDER — INSULIN PEN NEEDLE 32G X 4 MM MISC: 5 refills | Status: DC

## 2016-11-15 MED ORDER — INSULIN PEN NEEDLE 32G X 4 MM MISC: 3 refills | Status: DC

## 2016-11-15 NOTE — Telephone Encounter (Signed)
Pt called in and requested that her Levothyroxine be sent into the [place] at [facility].  She also stated that now her Humulog, Lantus, Pen Needles, and Lancets need to be sent to the [facility]	[place].

## 2016-11-15 NOTE — Telephone Encounter (Signed)
Submitted

## 2016-11-19 ENCOUNTER — Telehealth: Payer: Self-pay

## 2016-11-19 NOTE — Telephone Encounter (Signed)
Called rescheduled patient appointment due to weather.

## 2016-11-20 ENCOUNTER — Ambulatory Visit: Payer: 59 | Admitting: Internal Medicine

## 2016-11-27 ENCOUNTER — Other Ambulatory Visit: Payer: Self-pay

## 2016-11-27 DIAGNOSIS — IMO0001 Reserved for inherently not codable concepts without codable children: Secondary | ICD-10-CM

## 2016-11-27 DIAGNOSIS — E1165 Type 2 diabetes mellitus with hyperglycemia: Principal | ICD-10-CM

## 2016-11-27 MED ORDER — GLUCOSE BLOOD VI STRP: 1.0000 | ORAL_STRIP | Freq: Three times a day (TID) | 5 refills | Status: DC

## 2016-12-18 ENCOUNTER — Other Ambulatory Visit: Payer: Self-pay

## 2016-12-18 MED ORDER — INSULIN GLARGINE 100 UNIT/ML SOLOSTAR PEN: 70.0000 [IU] | PEN_INJECTOR | Freq: Every day | SUBCUTANEOUS | 2 refills | Status: DC

## 2016-12-18 MED ORDER — EMPAGLIFLOZIN 25 MG PO TABS: 25.0000 mg | ORAL_TABLET | Freq: Every day | ORAL | 3 refills | Status: DC

## 2016-12-18 MED ORDER — CANAGLIFLOZIN 100 MG PO TABS: ORAL_TABLET | ORAL | 1 refills | Status: DC

## 2016-12-18 MED ORDER — INSULIN LISPRO 100 UNIT/ML (KWIKPEN): PEN_INJECTOR | SUBCUTANEOUS | 3 refills | Status: DC

## 2017-01-16 ENCOUNTER — Other Ambulatory Visit: Payer: Self-pay | Admitting: Internal Medicine

## 2017-01-21 ENCOUNTER — Ambulatory Visit: Payer: 59 | Admitting: Internal Medicine

## 2017-04-18 ENCOUNTER — Encounter: Payer: Self-pay | Admitting: Internal Medicine

## 2017-04-18 ENCOUNTER — Ambulatory Visit (INDEPENDENT_AMBULATORY_CARE_PROVIDER_SITE_OTHER): Payer: BLUE CROSS/BLUE SHIELD | Admitting: Internal Medicine

## 2017-04-18 ENCOUNTER — Other Ambulatory Visit: Payer: Self-pay

## 2017-04-18 VITALS — BP 120/72 | HR 100 | Ht 66.0 in | Wt 194.0 lb

## 2017-04-18 DIAGNOSIS — Z794 Long term (current) use of insulin: Secondary | ICD-10-CM

## 2017-04-18 DIAGNOSIS — E890 Postprocedural hypothyroidism: Secondary | ICD-10-CM

## 2017-04-18 DIAGNOSIS — E1165 Type 2 diabetes mellitus with hyperglycemia: Secondary | ICD-10-CM

## 2017-04-18 LAB — POCT GLYCOSYLATED HEMOGLOBIN (HGB A1C): Hemoglobin A1C: 11.7

## 2017-04-18 MED ORDER — CANAGLIFLOZIN 100 MG PO TABS: ORAL_TABLET | ORAL | 1 refills | Status: DC

## 2017-04-18 MED ORDER — LEVOTHYROXINE SODIUM 100 MCG PO TABS: 100.0000 ug | ORAL_TABLET | Freq: Every day | ORAL | 0 refills | Status: DC

## 2017-04-18 MED ORDER — FREESTYLE LIBRE SENSOR SYSTEM MISC: 1.0000 | 11 refills | Status: DC

## 2017-04-18 MED ORDER — INSULIN GLARGINE 100 UNIT/ML SOLOSTAR PEN: 70.0000 [IU] | PEN_INJECTOR | Freq: Every day | SUBCUTANEOUS | 2 refills | Status: DC

## 2017-04-18 MED ORDER — INSULIN LISPRO 100 UNIT/ML (KWIKPEN): PEN_INJECTOR | SUBCUTANEOUS | 3 refills | Status: DC

## 2017-04-18 MED ORDER — METFORMIN HCL ER 500 MG PO TB24: ORAL_TABLET | ORAL | 1 refills | Status: DC

## 2017-04-18 MED ORDER — FREESTYLE LIBRE READER DEVI: 1.0000 | Freq: Three times a day (TID) | 1 refills | Status: DC

## 2017-04-18 NOTE — Addendum Note (Signed)
Addended by: Caprice Beaver T on: 04/18/2017 03:04 PM   Modules accepted: Orders

## 2017-04-18 NOTE — Progress Notes (Signed)
Patient ID: Samantha [name], female   DOB: July 12, 1966, 51 y.o.   MRN: [id number]  HPI: Samantha [name] is a 51 y.o.-year-old female, returning for follow-up for DM2, dx 2012, insulin-dependent since 2015, uncontrolled, without long term complications, but with medication noncompliance.   Last visit 8 months ago!  She had a lot of stress since last visit. She stopped her Zoloft >> more depressed >> stopped DM meds (only taking thy now)! She also stopped taking her levothyroxine a month ago, as she ran out!  Last hemoglobin A1c was: Lab Results  Component Value Date   HGBA1C 8.2 08/20/2016   HGBA1C 6.7 02/27/2016   HGBA1C 9.8 11/28/2015  05/27/2014: 12% 02/2014: 9%  She is supposed to be on the following regimen: - Metformin XR 1000 mg 2x a day - Invokana 100 mg daily in am - Levemir >> Lantus 70 units at bedtime  - NovoLog >> Humalog 15 min before every meal - 15 units before a smaller meal - 20 units before a regular meal She was on Farxiga x 2 years, but this was stopped b/c weight loss (205 >> 179 lbs), urinary frequency She tried regular metformin >> GI sxs.  Pt is not checking sugars now. At last visit: - am: 140-180, 220 >> 69-148, 165 >> 105-143, 230 - 2h after b'fast: 279-404 >> n/c >> 195-240 >> n/c - before lunch:120-140 >> 63-145, 188 >> n/c - 2h after lunch:  160-224 >> n/c >> 197 - before dinner:n/c >> 93-145, 152 >> 160-190 - 2h after dinner:  254 >> 190 >> 99 >> n/c - bedtime: n/c>> 164-189, 240 >> n/c - nighttime: n/c No lows. Lowest sugar was 67 >> 105 >> ?; she has hypoglycemia awareness - ? value  Highest sugar was 200s >> 230 >> ?.  Pt's meals are: - Breakfast: cereal or bisquit - Lunch: hamburger, chinese - Dinner: chicken  - Snacks: 2-3  - No CKD, last BUN/creatinine:  No results found for: BUN, CREATININE  02/10/2015: 13/0.83, GFR 86 11/16/2013: 20/0.92, GFR 79 - Lipid panel: No results found for: CHOL, HDL, LDLCALC, LDLDIRECT, TRIG,  CHOLHDL 02/10/2015: 185/168/44/107  On Crestor. - last eye exam was in 09/2015 >> No DR. Marland Kitchen [employer] at [facility]. - denies numbness and tingling in her feet.  She has a h/o hypothyroidism 2/2 RAI Tx for Graves ds.  Pt is on levothyroxine 100 mcg daily, usually taken: - in am - fasting - at least 30 min from b'fast - no Ca, Fe, MVI, PPIs - not on Biotin  She ran out of Levothyroxine 1 mo ago...  Reviewed previous TFTs: Lab Results  Component Value Date   TSH 4.47 02/27/2016   TSH 11.19 (H) 05/19/2015   TSH 2.90 10/28/2014   TSH 0.277 (L) 09/13/2014   TSH 0.31 (L) 06/24/2014   FREET4 0.61 02/27/2016   FREET4 0.71 05/19/2015   FREET4 0.79 10/28/2014   FREET4 1.21 09/13/2014   FREET4 1.18 06/24/2014  07/28/2013: TSH 1.69  ROS Constitutional: no weight gain/no weight loss, + fatigue, no subjective hyperthermia, no subjective hypothermia , + nocturia Eyes: + blurry vision, no xerophthalmia ENT: no sore throat, no nodules palpated in throat, no dysphagia, no odynophagia, no hoarseness Cardiovascular: no CP/no SOB/no palpitations/no leg swelling Respiratory: no cough/no SOB/no wheezing Gastrointestinal: no N/no V/no D/no C/no acid reflux Musculoskeletal: no muscle aches/no joint aches Skin: no rashes, + hair loss Neurological: no tremors/no numbness/no tingling/no dizziness  I reviewed pt's medications, allergies, PMH, social hx,  family hx, and changes were documented in the history of present illness. Otherwise, unchanged from my initial visit note.   PE: BP 120/72 (BP Location: Left Arm, Patient Position: Sitting)   Pulse 100   Ht 5\' 6"  (1.676 m)   Wt 194 lb (88 kg)   LMP 07/10/2012   SpO2 97%   BMI 31.31 kg/m  Body mass index is 31.31 kg/m.  Wt Readings from Last 3 Encounters:  04/18/17 194 lb (88 kg)  08/20/16 195 lb 9.6 oz (88.7 kg)  02/27/16 194 lb 9.6 oz (88.3 kg)   Constitutional: overweight, in NAD Eyes: PERRLA, EOMI, no exophthalmos ENT: moist  mucous membranes, no thyromegaly, no cervical lymphadenopathy Cardiovascular: RRR, No MRG Respiratory: CTA B Gastrointestinal: abdomen soft, NT, ND, BS+ Musculoskeletal: no deformities, strength intact in all 4 Skin: moist, warm, no rashes Neurological: no tremor with outstretched hands, DTR normal in all 4  ASSESSMENT: 1. DM2, insulin-dependent, uncontrolled, without long-term complications, but with hyperglycemia  2. Hypothyroidism - postablative for Graves ds.  PLAN:  1. Patient with uncontrolled diabetes, returning after a long absence. She has a history of medication noncompliance, now off almost all her diabetes medicines, and only taking them sporadically.he is usually on oral antidiabetic regimen + basal-bolus insulin regimen, which has worked for her in the past, whenever she was taking it as prescribed. In 02/2016, her HbA1c was 6.7%!  - She is not checking sugars now and I strongly advised her to start doing so - I also strongly advised her to start taking her medicines as prescribed. - She is interested in the Freestyle LIBRE CGM, which I sent to her pharmacy - I suggested to:  Patient Instructions  Please restart: - Metformin XR 1000 mg 2x a day - Invokana 100 mg daily in am - Lantus 70 units at bedtime  - Humalog 15 min before every meal - 15 units before a smaller meal - 20 units before a regular meal   Please restart:  - Levothyroxine 100 mcg daily   Take the thyroid hormone every day, with water, at least 30 minutes before breakfast, separated by at least 4 hours from: - acid reflux medications - calcium - iron - multivitamins  Please return in 1.5 months with your sugar log.   - today, HbA1c is 11.7% (very high) - continue checking sugars at different times of the day - check 3x a day, rotating checks - advised for yearly eye exams >> she needs one - I plan to check her annual labs at next visit, if not checked by PCP - Return to clinic in 3 mo with sugar  log   2. Hypothyroidism - latest thyroid labs reviewed with pt >> normal last year - she is supposed to be on LT4 100 mcg daily, however, she stopped this one month ago as she ran out. We discussed about consequences of hypothyroidism and why she needs to avoid coming off the medication in the future - I refilled her levothyroxine today - we discussed about taking the thyroid hormone every day, with water, >30 minutes before breakfast, separated by >4 hours from acid reflux medications, calcium, iron, multivitamins.  - I will check her TFTs at next visit  Philemon Kingdom, MD PhD [facility]

## 2017-04-18 NOTE — Patient Instructions (Addendum)
Please restart: - Metformin XR 1000 mg 2x a day - Invokana 100 mg daily in am - Lantus 70 units at bedtime  - Humalog 15 min before every meal - 15 units before a smaller meal - 20 units before a regular meal   Please restart:  - Levothyroxine 100 mcg daily   Take the thyroid hormone every day, with water, at least 30 minutes before breakfast, separated by at least 4 hours from: - acid reflux medications - calcium - iron - multivitamins  Please return in 1.5 months with your sugar log.

## 2017-04-22 ENCOUNTER — Telehealth: Payer: Self-pay | Admitting: Internal Medicine

## 2017-04-22 NOTE — Telephone Encounter (Signed)
Freestyle Libre reader not covered by insurance. Patient has [place]. Patient wants someone to find out which kind of device is covered.  Thank you,  -LL

## 2017-04-23 ENCOUNTER — Telehealth: Payer: Self-pay

## 2017-04-23 NOTE — Telephone Encounter (Signed)
Please advise. Wouldn't the only device be available a plain meter? Thanks.

## 2017-04-23 NOTE — Telephone Encounter (Signed)
Called patient and advised to call insurance to find out if any CGM is covered. Patient will call and let us know. No other questions.

## 2017-04-23 NOTE — Telephone Encounter (Signed)
Please ask her to call insurance and find out which CGM is covered: if not [employer], then maybe Dexcom. If none, we have to stay with her regular meter.

## 2017-04-23 NOTE — Telephone Encounter (Signed)
Routing to you °

## 2017-04-29 ENCOUNTER — Other Ambulatory Visit: Payer: Self-pay

## 2017-04-29 MED ORDER — INSULIN ASPART 100 UNIT/ML FLEXPEN: PEN_INJECTOR | SUBCUTANEOUS | 1 refills | Status: DC

## 2017-06-10 ENCOUNTER — Ambulatory Visit (INDEPENDENT_AMBULATORY_CARE_PROVIDER_SITE_OTHER): Payer: BLUE CROSS/BLUE SHIELD | Admitting: Internal Medicine

## 2017-06-10 ENCOUNTER — Encounter: Payer: Self-pay | Admitting: Internal Medicine

## 2017-06-10 VITALS — BP 118/78 | HR 97 | Wt 193.0 lb

## 2017-06-10 DIAGNOSIS — E890 Postprocedural hypothyroidism: Secondary | ICD-10-CM | POA: Diagnosis not present

## 2017-06-10 DIAGNOSIS — E1165 Type 2 diabetes mellitus with hyperglycemia: Secondary | ICD-10-CM

## 2017-06-10 DIAGNOSIS — Z794 Long term (current) use of insulin: Secondary | ICD-10-CM | POA: Diagnosis not present

## 2017-06-10 DIAGNOSIS — E11311 Type 2 diabetes mellitus with unspecified diabetic retinopathy with macular edema: Secondary | ICD-10-CM | POA: Insufficient documentation

## 2017-06-10 NOTE — Progress Notes (Signed)
Patient ID: Samantha [name], female   DOB: July 21, 1966, 51 y.o.   MRN: [id number]  HPI: Samantha [name] is a 51 y.o.-year-old female, returning for follow-up for DM2, dx 2012, insulin-dependent since 2015, uncontrolled, without long term complications, but with medication noncompliance. Last OV 1.5 mo ago.  Last hemoglobin A1c was: Lab Results  Component Value Date   HGBA1C 11.7 04/18/2017   HGBA1C 8.2 08/20/2016   HGBA1C 6.7 02/27/2016  05/27/2014: 12% 02/2014: 9%  She is on (all restarted at last visit): - Metformin XR 1000 mg 2x a day - Invokana 100 mg daily in am - Lantus 70 units at bedtime  - Humalog 15 min before every meal - misses doses as she skips meal (for e.g. Today, she was last appt of the day and did not eat all day...) - 15 units before a smaller meal - 20 units before a regular meal She was on Farxiga x 2 years, but this was stopped b/c weight loss (205 >> 179 lbs), urinary frequency She tried regular metformin >> GI sxs.  Pt is checking sugars 2-3x day now: - am: 140-180, 220 >> 69-148, 165 >> 105-143, 230 >> 124-260 - 2h after b'fast: 279-404 >> n/c >> 195-240 >> n/c  - before lunch:120-140 >> 63-145, 188 >> n/c >> 85, 116-266 - 2h after lunch:  160-224 >> n/c >> 197 >> 123-238 - before dinner:n/c >> 93-145, 152 >> 160-190 >> 146-235 - 2h after dinner:  254 >> 190 >> 99 >> n/c >> 194, 245 - bedtime: n/c>> 164-189, 240 >> n/c >> 206, 393 - nighttime: n/c Lowest sugar was 67 >> 105 >> 85; she has hypoglycemia awareness - ? value Highest sugar was 200s >> 230 >> 393.  Pt's meals are: - Breakfast: cereal or bisquit - Lunch: hamburger, chinese - Dinner: chicken  - Snacks: 2-3  - No CKD, last BUN/creatinine:  No results found for: BUN, CREATININE  02/10/2015: 13/0.83, GFR 86 11/16/2013: 20/0.92, GFR 79 - Lipid panel: No results found for: CHOL, HDL, LDLCALC, LDLDIRECT, TRIG, CHOLHDL 02/10/2015: 185/168/44/107  On Crestor. - last eye exam was in  09/2016 >> No DR. [employer] at [employer] [facility]. - denies numbness and tingling in her feet.  She has a h/o hypothyroidism 2/2 RAI Tx for Graves ds.  Pt is on levothyroxine 100 mcg daily, taken: - in am - fasting - NOT always 30 min from b'fast - no Ca, Fe, MVI, PPIs - not on Biotin  Reviewed previous TFTs: Lab Results  Component Value Date   TSH 4.47 02/27/2016   TSH 11.19 (H) 05/19/2015   TSH 2.90 10/28/2014   TSH 0.277 (L) 09/13/2014   TSH 0.31 (L) 06/24/2014   FREET4 0.61 02/27/2016   FREET4 0.71 05/19/2015   FREET4 0.79 10/28/2014   FREET4 1.21 09/13/2014   FREET4 1.18 06/24/2014  07/28/2013: TSH 1.69  ROS Constitutional: no weight gain/no weight loss, no fatigue, no subjective hyperthermia, no subjective hypothermia Eyes: no blurry vision, no xerophthalmia ENT: no sore throat, no nodules palpated in throat, no dysphagia, no odynophagia, no hoarseness Cardiovascular: no CP/no SOB/no palpitations/no leg swelling Respiratory: no cough/no SOB/no wheezing Gastrointestinal: no N/no V/no D/no C/no acid reflux Musculoskeletal: no muscle aches/no joint aches Skin: no rashes, no hair loss Neurological: no tremors/no numbness/no tingling/no dizziness  I reviewed pt's medications, allergies, PMH, social hx, family hx, and changes were documented in the history of present illness. Otherwise, unchanged from my initial visit note.  PE: BP 118/78 (BP Location:  Left Arm, Patient Position: Sitting)   Pulse 97   Wt 193 lb (87.5 kg)   LMP 07/10/2012   SpO2 90%   BMI 31.15 kg/m  Body mass index is 31.15 kg/m.  Wt Readings from Last 3 Encounters:  06/10/17 193 lb (87.5 kg)  04/18/17 194 lb (88 kg)  08/20/16 195 lb 9.6 oz (88.7 kg)   Constitutional: overweight, in NAD Eyes: PERRLA, EOMI, no exophthalmos ENT: moist mucous membranes, no thyromegaly, no cervical lymphadenopathy Cardiovascular: tachycardia, RR, No MRG Respiratory: CTA B Gastrointestinal: abdomen soft, NT, ND,  BS+ Musculoskeletal: no deformities, strength intact in all 4 Skin: moist, warm, no rashes Neurological: no tremor with outstretched hands, DTR normal in all 4  ASSESSMENT: 1. DM2, insulin-dependent, uncontrolled, without long-term complications, but with hyperglycemia  2. Hypothyroidism - postablative for Graves ds.  PLAN:  1. Patient with uncontrolled diabetes, now back on her medicines, with better sugars but still many hyperglycemic spikes. She has a h/o med noncompliance - in the past, when she was taking her current regimen as advised (02/2016) >> her HbA1c was 6.7!  - she is now doing a much better job checking her sugars but lacks discipline in her life. Her waking up times are erratic, as are her mealtimes. She would like to go back to work, but cannot find employment. I asked her what she likes to do: sleep, eat and watch TV. We discussed to try to find a hobby and also start walking either outside or at the [facility]. - I again strongly advised her to start taking her medicines as prescribed. - She is interested in the Freestyle LIBRE CGM, which I sent to her pharmacy at last visit >> not covered - I suggested to:  Patient Instructions  Please continue: - Metformin XR 1000 mg 2x a day - Invokana 100 mg daily in am - Lantus 70 units at bedtime  - Humalog 15 min before every meal - 15 units before a smaller meal - 20 units before a regular meal   Please continue  - Levothyroxine 100 mcg daily   Take the thyroid hormone every day, with water, at least 30 minutes before breakfast, separated by at least 4 hours from: - acid reflux medications - calcium - iron - multivitamins  Please return in 1.5 months with your sugar log.   - continue checking sugars at different times of the day - check 3x a day, rotating checks - advised for yearly eye exams >> she is due - will need labs at next visit (along with TFTs, per her preference) - Return to clinic in 3 mo with sugar  log     2. Hypothyroidism - latest thyroid labs reviewed with pt >> normal  - she continues on LT4 100 mcg daily (was off at last visit >> restarted then) - pt feels good on this dose. - we discussed about taking the thyroid hormone every day, with water, >30 minutes before breakfast, separated by >4 hours from acid reflux medications, calcium, iron, multivitamins. Pt. is still not taking it correctly, after discussing correct intake at every one of our visits: she does not wait 30 min after LT4 before eating. - will check thyroid tests at next visit, after she starts separating her meals from LT4: TSH and fT4  Philemon Kingdom, MD PhD [facility]

## 2017-06-10 NOTE — Patient Instructions (Addendum)
Please continue: - Metformin XR 1000 mg 2x a day - Invokana 100 mg daily in am - Lantus 70 units at bedtime  - Humalog 15 min before every meal - 15 units before a smaller meal - 20 units before a regular meal  Please continue  - Levothyroxine 100 mcg daily  Take the thyroid hormone every day, with water, at least 30 minutes before breakfast, separated by at least 4 hours from: - acid reflux medications - calcium - iron - multivitamins  Please return in 2 months with your sugar log.

## 2017-07-23 ENCOUNTER — Other Ambulatory Visit: Payer: Self-pay | Admitting: Internal Medicine

## 2017-07-23 DIAGNOSIS — E890 Postprocedural hypothyroidism: Secondary | ICD-10-CM

## 2017-07-31 ENCOUNTER — Other Ambulatory Visit: Payer: BLUE CROSS/BLUE SHIELD

## 2017-08-08 ENCOUNTER — Ambulatory Visit: Payer: BLUE CROSS/BLUE SHIELD | Admitting: Internal Medicine

## 2017-08-13 ENCOUNTER — Other Ambulatory Visit: Payer: Self-pay | Admitting: Family Medicine

## 2017-08-13 DIAGNOSIS — Z1231 Encounter for screening mammogram for malignant neoplasm of breast: Secondary | ICD-10-CM

## 2017-08-14 ENCOUNTER — Other Ambulatory Visit: Payer: Self-pay | Admitting: Internal Medicine

## 2017-08-14 NOTE — Telephone Encounter (Signed)
Pt called and needs refill  levothyroxine (SYNTHROID, LEVOTHROID) 100 MCG tablet    [place], [place] - [address]

## 2017-09-12 ENCOUNTER — Ambulatory Visit
Admission: RE | Admit: 2017-09-12 | Discharge: 2017-09-12 | Disposition: A | Discharge status: Home / Self Care | Payer: BLUE CROSS/BLUE SHIELD | Source: Ambulatory Visit | Attending: Family Medicine | Admitting: Family Medicine

## 2017-09-12 DIAGNOSIS — Z1231 Encounter for screening mammogram for malignant neoplasm of breast: Secondary | ICD-10-CM

## 2017-10-29 ENCOUNTER — Ambulatory Visit: Payer: Self-pay

## 2017-10-29 ENCOUNTER — Encounter: Payer: Self-pay | Admitting: Podiatry

## 2017-10-29 DIAGNOSIS — M2042 Other hammer toe(s) (acquired), left foot: Principal | ICD-10-CM

## 2017-10-29 DIAGNOSIS — M2041 Other hammer toe(s) (acquired), right foot: Secondary | ICD-10-CM

## 2017-10-29 NOTE — Progress Notes (Signed)
This encounter was created in error - please disregard.

## 2017-10-30 ENCOUNTER — Ambulatory Visit: Payer: BLUE CROSS/BLUE SHIELD | Admitting: Internal Medicine

## 2017-11-08 ENCOUNTER — Ambulatory Visit (INDEPENDENT_AMBULATORY_CARE_PROVIDER_SITE_OTHER): Payer: BLUE CROSS/BLUE SHIELD

## 2017-11-08 ENCOUNTER — Ambulatory Visit: Payer: BLUE CROSS/BLUE SHIELD | Admitting: Podiatry

## 2017-11-08 ENCOUNTER — Other Ambulatory Visit: Payer: Self-pay | Admitting: Internal Medicine

## 2017-11-08 DIAGNOSIS — M722 Plantar fascial fibromatosis: Secondary | ICD-10-CM

## 2017-11-08 DIAGNOSIS — M79675 Pain in left toe(s): Secondary | ICD-10-CM

## 2017-11-08 DIAGNOSIS — M79674 Pain in right toe(s): Secondary | ICD-10-CM

## 2017-11-08 DIAGNOSIS — E1149 Type 2 diabetes mellitus with other diabetic neurological complication: Secondary | ICD-10-CM | POA: Diagnosis not present

## 2017-11-08 DIAGNOSIS — B351 Tinea unguium: Secondary | ICD-10-CM | POA: Diagnosis not present

## 2017-11-08 MED ORDER — DICLOFENAC SODIUM 1 % TD GEL: 2.0000 g | Freq: Four times a day (QID) | TRANSDERMAL | 0 refills | Status: DC

## 2017-11-08 NOTE — Patient Instructions (Signed)

## 2017-11-08 NOTE — Progress Notes (Signed)
Subjective:    Patient ID: Samantha [name], female    DOB: [birth year], 52 y.o.   MRN: [id number]  HPI 52 year old female presents the office today for concerns of pain on the arch of the right foot and as well as into the toes on both feet which is been ongoing for about 3 months.  She states that max her pain is 5/10 and describes an aching sensation.  She does take Tylenol.  She also has some numbness and tingling to her toes.  She is diabetic and her last A1c was 8.3.  She also states her nails are thick and elongated she cannot trim them herself.  Denies any recent injury or trauma to her feet she denies any swelling or redness.  She has no other concerns today this time.   Review of Systems  All other systems reviewed and are negative.  No past medical history on file.  Past Surgical History:  Procedure Laterality Date  . CESAREAN SECTION    . UTERINE FIBROID EMBOLIZATION       Current Outpatient Medications:  .  B-D UF III MINI PEN NEEDLES 31G X 5 MM MISC, See admin instructions., Disp: , Rfl: 11 .  Blood Glucose Monitoring Suppl (BAYER CONTOUR NEXT MONITOR) w/Device KIT, See admin instructions., Disp: , Rfl: 0 .  cyclobenzaprine (FLEXERIL) 10 MG tablet, TAKE 1 TABLET BY MOUTH AT BEDTIME AS NEEDED FOR SPASM, Disp: , Rfl: 1 .  DEXILANT 60 MG capsule, Take 60 mg by mouth daily. Reported on 02/27/2016, Disp: , Rfl:  .  glucose blood (ONETOUCH VERIO) test strip, 1 each by Other route 3 (three) times daily. Use as instructed, Disp: 200 each, Rfl: 5 .  ibuprofen (ADVIL,MOTRIN) 600 MG tablet, Take one by mouth every 4-6 hours as needed for pain, Disp: , Rfl:  .  insulin lispro (HUMALOG KWIKPEN) 100 UNIT/ML KiwkPen, Inject 15-20 units 3 times daily with meals., Disp: 15 mL, Rfl: 3 .  INVOKANA 100 MG TABS tablet, TAKE 1 TABLET (100 MG TOTAL) BY MOUTH DAILY., Disp: 30 tablet, Rfl: 2 .  levothyroxine (SYNTHROID, LEVOTHROID) 100 MCG tablet, TAKE 1 TABLET BY MOUTH ONCE DAILY, Disp: 90  tablet, Rfl: 0 .  LORazepam (ATIVAN) 0.5 MG tablet, TAKE 1/2-1 TABLET BY MOUTH 3 TIMES DAILY AS NEEDED FOR ANXIETY, Disp: , Rfl: 0 .  metFORMIN (GLUCOPHAGE-XR) 500 MG 24 hr tablet, TAKE TWO TABLETS BY MOUTH TWICE DAILY WITH A MEAL, Disp: 360 tablet, Rfl: 1 .  naproxen sodium (ANAPROX) 550 MG tablet, , Disp: , Rfl: 0 .  Probiotic Product (PROBIOTIC DAILY PO), Take 1 capsule by mouth daily., Disp: , Rfl:  .  rosuvastatin (CRESTOR) 5 MG tablet, Take 1 tablet (5 mg total) by mouth at bedtime., Disp: 90 tablet, Rfl: 0 .  sertraline (ZOLOFT) 100 MG tablet, Take 1 tablet (100 mg total) by mouth daily., Disp: 90 tablet, Rfl: 0 .  trimethoprim-polymyxin b (POLYTRIM) ophthalmic solution, Place 1 drop into the right eye every 4 (four) hours. Reported on 02/27/2016, Disp: , Rfl: 0 .  VITAMIN D, ERGOCALCIFEROL, PO, Take 1 capsule by mouth daily., Disp: , Rfl:  .  diclofenac sodium (VOLTAREN) 1 % GEL, Apply 2 g topically 4 (four) times daily. Rub into affected area of foot 2 to 4 times daily, Disp: 100 g, Rfl: 0 .  LANTUS SOLOSTAR 100 UNIT/ML Solostar Pen, INJECT 70 UNITS INTO THE SKIN AT BEDTIME, Disp: 30 pen, Rfl: 2  Allergies  Allergen Reactions  .  Atorvastatin   . Bupropion   . Doxepin Hcl     Social History   Socioeconomic History  . Marital status: Single    Spouse name: Not on file  . Number of children: Not on file  . Years of education: Not on file  . Highest education level: Not on file  Social Needs  . Financial resource strain: Not on file  . Food insecurity - worry: Not on file  . Food insecurity - inability: Not on file  . Transportation needs - medical: Not on file  . Transportation needs - non-medical: Not on file  Occupational History  . Not on file  Tobacco Use  . Smoking status: Never Smoker  . Smokeless tobacco: Never Used  Substance and Sexual Activity  . Alcohol use: No    Alcohol/week: 0.0 oz  . Drug use: Not on file  . Sexual activity: Not on file  Other Topics  Concern  . Not on file  Social History Narrative  . Not on file       Objective:   Physical Exam General: AAO x3, NAD  Dermatological: Nails are hypertrophic, dystrophic, brittle, discolored, elongated 10. No surrounding redness or drainage. Tenderness nails 1-5 bilaterally. No open lesions or pre-ulcerative lesions are identified today.  Vascular: Dorsalis Pedis artery and Posterior Tibial artery pedal pulses are 2/4 bilateral with immedate capillary fill time. There is no pain with calf compression, swelling, warmth, erythema.   Neruologic: Sensation mildly decreased with Derrel Nip monofilament to the digits.  Musculoskeletal: There is minimal tenderness to palpation along the plantar medial tubercle of the calcaneus at the insertion of plantar fascia on the left right foot. There is mild pain along the course of the plantar fascia within the arch of the foot. Plantar fascia appears to be intact. There is no pain with lateral compression of the calcaneus or pain with vibratory sensation. There is no pain along the course or insertion of the achilles tendon. No other areas of tenderness to bilateral lower extremities.  Muscular strength 5/5 in all groups tested bilateral.  Gait: Unassisted, Nonantalgic.     Assessment & Plan:  52 year old female with symptomatic onychomycosis, arch pain likely plantar fasciitis, type 2 diabetes with likely neuropathy -Treatment options discussed including all alternatives, risks, and complications -Etiology of symptoms were discussed -X-rays were obtained and reviewed with the patient.  Inferior calcaneal spurring is present.  No evidence of acute fracture identified otherwise. -In regards to the plantar fasciitis we discussed stretching, icing exercises daily.  I dispensed a night splint. Voltaren gel.  Discussed shoe gear modifications and orthotics as well. -Sharply debrided the nails x10 without any complications or bleeding -Discussed  likely neuropathy symptoms to her toes.  If symptoms continue despite treatment for the other issues consider medications for this. -Follow-up in 6 weeks or sooner if needed.  Call any questions or concerns.  Trula Slade DPM

## 2017-11-14 ENCOUNTER — Other Ambulatory Visit: Payer: Self-pay | Admitting: Internal Medicine

## 2017-11-25 ENCOUNTER — Other Ambulatory Visit: Payer: Self-pay | Admitting: Internal Medicine

## 2017-11-25 DIAGNOSIS — E1165 Type 2 diabetes mellitus with hyperglycemia: Principal | ICD-10-CM

## 2017-11-25 DIAGNOSIS — IMO0001 Reserved for inherently not codable concepts without codable children: Secondary | ICD-10-CM

## 2017-12-14 ENCOUNTER — Other Ambulatory Visit: Payer: Self-pay | Admitting: Internal Medicine

## 2017-12-20 ENCOUNTER — Ambulatory Visit: Payer: BLUE CROSS/BLUE SHIELD | Admitting: Podiatry

## 2017-12-25 ENCOUNTER — Ambulatory Visit (INDEPENDENT_AMBULATORY_CARE_PROVIDER_SITE_OTHER): Payer: BLUE CROSS/BLUE SHIELD | Admitting: Internal Medicine

## 2017-12-25 ENCOUNTER — Encounter: Payer: Self-pay | Admitting: Internal Medicine

## 2017-12-25 VITALS — BP 126/78 | HR 90 | Ht 66.0 in | Wt 187.0 lb

## 2017-12-25 DIAGNOSIS — E890 Postprocedural hypothyroidism: Secondary | ICD-10-CM

## 2017-12-25 DIAGNOSIS — E1165 Type 2 diabetes mellitus with hyperglycemia: Secondary | ICD-10-CM

## 2017-12-25 DIAGNOSIS — Z794 Long term (current) use of insulin: Secondary | ICD-10-CM

## 2017-12-25 LAB — COMPLETE METABOLIC PANEL WITH GFR
AG RATIO: 1.9 (calc) (ref 1.0–2.5)
ALKALINE PHOSPHATASE (APISO): 85 U/L (ref 33–130)
ALT: 11 U/L (ref 6–29)
AST: 10 U/L (ref 10–35)
Albumin: 4.3 g/dL (ref 3.6–5.1)
BUN: 19 mg/dL (ref 7–25)
CO2: 26 mmol/L (ref 20–32)
Calcium: 9.5 mg/dL (ref 8.6–10.4)
Chloride: 107 mmol/L (ref 98–110)
Creat: 0.8 mg/dL (ref 0.50–1.05)
GFR, Est African American: 99 mL/min/{1.73_m2} (ref >=60)
GFR, Est Non African American: 85 mL/min/{1.73_m2} (ref >=60)
GLOBULIN: 2.3 g/dL (ref 1.9–3.7)
Glucose, Bld: 140 mg/dL — ABNORMAL HIGH (ref 65–99)
POTASSIUM: 4 mmol/L (ref 3.5–5.3)
SODIUM: 143 mmol/L (ref 135–146)
Total Bilirubin: 0.3 mg/dL (ref 0.2–1.2)
Total Protein: 6.6 g/dL (ref 6.1–8.1)

## 2017-12-25 LAB — LIPID PANEL
CHOLESTEROL: 130 mg/dL (ref 0–200)
HDL: 35.9 mg/dL — ABNORMAL LOW (ref >39.00)
LDL CALC: 77 mg/dL (ref 0–99)
NONHDL: 94.29
Total CHOL/HDL Ratio: 4
Triglycerides: 88 mg/dL (ref 0.0–149.0)
VLDL: 17.6 mg/dL (ref 0.0–40.0)

## 2017-12-25 LAB — MICROALBUMIN / CREATININE URINE RATIO
CREATININE, U: 110.4 mg/dL
MICROALB/CREAT RATIO: 0.6 mg/g (ref 0.0–30.0)

## 2017-12-25 LAB — T4, FREE: Free T4: 0.84 ng/dL (ref 0.60–1.60)

## 2017-12-25 LAB — POCT GLYCOSYLATED HEMOGLOBIN (HGB A1C): Hemoglobin A1C: 8.4

## 2017-12-25 LAB — TSH: TSH: 5.58 u[IU]/mL — AB (ref 0.35–4.50)

## 2017-12-25 NOTE — Progress Notes (Addendum)
Patient ID: Samantha [name], female   DOB: 04/11/1966, 52 y.o.   MRN: [id number]  HPI: Samantha [name] is a 52 y.o.-year-old female, returning for follow-up for DM2, dx 2012, insulin-dependent since 2015, uncontrolled, without long term complications, but with medication noncompliance. Last OV 6 months ago.  She started to work second shift in 07/2017. Starts 1-5 pm and ends at 12 am. She eats at that time >> gets to sleep at 2-3 am.  She got the freestyle libre CGM Since last visit  - she paid for it out of pocket.  She likes this but did not bring the receiver for [place] to download.  Last hemoglobin A1c was reviewed: Lab Results  Component Value Date   HGBA1C 11.7 04/18/2017   HGBA1C 8.2 08/20/2016   HGBA1C 6.7 02/27/2016  05/27/2014: 12% 02/2014: 9%  She is on: - Metformin XR 1000 mg 2x a day - Invokana 100 mg daily in am - Lantus 70 units at bedtime  - Humalog 15-20 units before every meal - but missing doses as she is still skipping meals She was on Farxiga x 2 years, but this was stopped b/c weight loss (205 >> 179 lbs), urinary frequency She tried regular metformin >> GI sxs.  Pt is checking sugars 2-3 times a day - forgot log and cannot remember CGM patterns: - am: 69-148, 165 >> 105-143, 230 >> 124-260 >> n/c - 2h after b'fast: 279-404 >> n/c >> 195-240 >> n/c  - before lunch: 63-145, 188 >> n/c >> 85, 116-266 >> 90-170 - 2h after lunch:  160-224 >> n/c >> 197 >> 123-238 >> n/c - before dinner: 93-145, 152 >> 160-190 >> 146-235 >> n/c - 2h after dinner: 190 >> 99 >> n/c >> 194, 245 >> n/c  - bedtime: n/c>> 164-189, 240 >> n/c >> 206, 393 >> n/c - nighttime: n/c Lowest sugar was 85 >> 90;  it is unclear at which level she has hypoglycemia awareness Highest sugar was 200s >> 230 >> 393 >> 200  Pt's meals are: - Breakfast: cereal or bisquit - Lunch: hamburger, chinese - Dinner: chicken  - Snacks: 2-3: Frosted flakes! Fruit punch! But cut back on regular drinks  .  -No CKD, last BUN/creatinine:  No results found for: BUN, CREATININE  02/10/2015: 13/0.83, GFR 86 11/16/2013: 20/0.92, GFR 79  -+ HL; lipid panel: No results found for: CHOL, HDL, LDLCALC, LDLDIRECT, TRIG, CHOLHDL 02/10/2015: 185/168/44/107  On Crestor generic.  - last eye exam was in 09/2016: No DR. [facility] at [facility].  - no numbness and tingling in her feet.  Hypothyroidism after RAI treatment for Graves' disease  - history of medication noncompliance  Pt is on levothyroxine 100 mcg daily, taken: - in am - fasting - at least 30 min from b'fast - no Ca, Fe, MVI, PPIs - not on Biotin  Reviewed previous TFTs: Lab Results  Component Value Date   TSH 4.47 02/27/2016   TSH 11.19 (H) 05/19/2015   TSH 2.90 10/28/2014   TSH 0.277 (L) 09/13/2014   TSH 0.31 (L) 06/24/2014   FREET4 0.61 02/27/2016   FREET4 0.71 05/19/2015   FREET4 0.79 10/28/2014   FREET4 1.21 09/13/2014   FREET4 1.18 06/24/2014  07/28/2013: TSH 1.69  ROS Constitutional: no weight gain/no weight loss, no fatigue, no subjective hyperthermia, no subjective hypothermia Eyes: no blurry vision, no xerophthalmia ENT: no sore throat, no nodules palpated in throat, no dysphagia, no odynophagia, no hoarseness Cardiovascular: no CP/no SOB/no palpitations/no leg swelling  Respiratory: no cough/no SOB/no wheezing Gastrointestinal: no N/no V/no D/no C/no acid reflux Musculoskeletal: no muscle aches/no joint aches Skin: no rashes, no hair loss Neurological: no tremors/no numbness/no tingling/no dizziness  I reviewed pt's medications, allergies, PMH, social hx, family hx, and changes were documented in the history of present illness. Otherwise, unchanged from my initial visit note.  PE: BP 126/78   Pulse 90   Ht 5\' 6"  (1.676 m)   Wt 187 lb (84.8 kg)   LMP 07/10/2012   SpO2 97%   BMI 30.18 kg/m  There is no height or weight on file to calculate BMI.  Wt Readings from Last 3 Encounters:  12/25/17 187 lb  (84.8 kg)  06/10/17 193 lb (87.5 kg)  04/18/17 194 lb (88 kg)   Constitutional: overweight, in NAD Eyes: PERRLA, EOMI, no exophthalmos ENT: moist mucous membranes, no thyromegaly, no cervical lymphadenopathy Cardiovascular: + Tachycardia RR, No MRG Respiratory: CTA B Gastrointestinal: abdomen soft, NT, ND, BS+ Musculoskeletal: no deformities, strength intact in all 4 Skin: moist, warm, no rashes Neurological: no tremor with outstretched hands, DTR normal in all 4  ASSESSMENT: 1. DM2, insulin-dependent, uncontrolled, without long-term complications, but with hyperglycemia  2. Hypothyroidism - postablative for Graves ds.  PLAN:  1. Patient with uncontrolled diabetes, back on her medicines at last visit with improving sugars.  She has a history of medication noncompliance and when she was taking the medications as advised (02/2016) her HbA1c was 6.7%! At last visit, we discussed about improving discipline in her life: Her waking up and meal times were erratic, and we discussed about how to try to get this to be more consistent.  She was trying to get back to work at that time.  Since then, she started working second shift.  She does not like it.  I did recommend to start some form of activity/exercise.  She did not start yet.  She started to reduce her sweets intake, but is still using frosted flakes and fruit punch.  Advised to stop these. - Continue freestyle libre CGM -advised to bring the receiver at next visit so we can download it  - The sugars that she checks at home appear better than before, but I do not have enough data to decide about possible further changes in her regimen - today, HbA1c is 8.5% (much better) - Therefore, we will continue the current regimen,and continue to adjust her diet - We will check annual labs today: Orders Placed This Encounter  Procedures  . TSH  . T4, free  . Microalbumin / creatinine urine ratio  . COMPLETE METABOLIC PANEL WITH GFR  . Lipid panel   . POCT glycosylated hemoglobin (Hb A1C)  - I suggested to:  Patient Instructions  Please continue: - Metformin XR 1000 mg 2x a day - Invokana 100 mg daily in am - Lantus 70 units at bedtime  - Humalog 15-20 units before every meal   Please continue  - Levothyroxine 100 mcg daily   Take the thyroid hormone every day, with water, at least 30 minutes before breakfast, separated by at least 4 hours from: - acid reflux medications - calcium - iron - multivitamins  Please return in 3 months with your sugar log  - continue checking sugars at different times of the day - check 3x a day, rotating checks - advised for yearly eye exams >> she is UTD - Return to clinic in 3 mo with sugar log     2. Hypothyroidism - latest  thyroid labs reviewed with pt >> normal  - she continues on LT4 100 mcg daily - pt feels good on this dose. - we discussed about taking the thyroid hormone every day, with water, >30 minutes before breakfast, separated by >4 hours from acid reflux medications, calcium, iron, multivitamins. Pt. is taking it correctly now, we separated levothyroxine from meals at last visit - will check thyroid tests today: TSH and fT4 - If labs are abnormal, she will need to return for repeat TFTs in 1.5 months  Office Visit on 12/25/2017  Component Date Value Ref Range Status  . TSH 12/25/2017 5.58* 0.35 - 4.50 uIU/mL Final  . Free T4 12/25/2017 0.84  0.60 - 1.60 ng/dL Final   Comment: Specimens from patients who are undergoing biotin therapy and /or ingesting biotin supplements may contain high levels of biotin.  The higher biotin concentration in these specimens interferes with this Free T4 assay.  Specimens that contain high levels  of biotin may cause false high results for this Free T4 assay.  Please interpret results in light of the total clinical presentation of the patient.    Jacquelyne Balint, Ur 12/25/2017 <0.7  0.0 - 1.9 mg/dL Final  . Creatinine,U 12/25/2017 110.4  mg/dL Final   . Microalb Creat Ratio 12/25/2017 0.6  0.0 - 30.0 mg/g Final  . Glucose, Bld 12/25/2017 140* 65 - 99 mg/dL Final   Comment: .            Fasting reference interval . For someone without known diabetes, a glucose value >125 mg/dL indicates that they may have diabetes and this should be confirmed with a follow-up test. .   . BUN 12/25/2017 19  7 - 25 mg/dL Final  . Creat 12/25/2017 0.80  0.50 - 1.05 mg/dL Final   Comment: For patients >32 years of age, the reference limit for Creatinine is approximately 13% higher for people identified as African-American. .   . GFR, Est Non African American 12/25/2017 85  > OR = 60 mL/min/1.40m2 Final  . GFR, Est African American 12/25/2017 99  > OR = 60 mL/min/1.86m2 Final  . BUN/Creatinine Ratio 75/64/3329 NOT APPLICABLE  6 - 22 (calc) Final  . Sodium 12/25/2017 143  135 - 146 mmol/L Final  . Potassium 12/25/2017 4.0  3.5 - 5.3 mmol/L Final  . Chloride 12/25/2017 107  98 - 110 mmol/L Final  . CO2 12/25/2017 26  20 - 32 mmol/L Final  . Calcium 12/25/2017 9.5  8.6 - 10.4 mg/dL Final  . Total Protein 12/25/2017 6.6  6.1 - 8.1 g/dL Final  . Albumin 12/25/2017 4.3  3.6 - 5.1 g/dL Final  . Globulin 12/25/2017 2.3  1.9 - 3.7 g/dL (calc) Final  . AG Ratio 12/25/2017 1.9  1.0 - 2.5 (calc) Final  . Total Bilirubin 12/25/2017 0.3  0.2 - 1.2 mg/dL Final  . Alkaline phosphatase (APISO) 12/25/2017 85  33 - 130 U/L Final  . AST 12/25/2017 10  10 - 35 U/L Final  . ALT 12/25/2017 11  6 - 29 U/L Final  . Cholesterol 12/25/2017 130  0 - 200 mg/dL Final   ATP III Classification       Desirable:  < 200 mg/dL               Borderline High:  200 - 239 mg/dL          High:  > = 240 mg/dL  . Triglycerides 12/25/2017 88.0  0.0 - 149.0 mg/dL Final  Normal:  <150 mg/dLBorderline High:  150 - 199 mg/dL  . HDL 12/25/2017 35.90* >39.00 mg/dL Final  . VLDL 12/25/2017 17.6  0.0 - 40.0 mg/dL Final  . LDL Cholesterol 12/25/2017 77  0 - 99 mg/dL Final  . Total CHOL/HDL  Ratio 12/25/2017 4   Final                  Men          Women1/2 Average Risk     3.4          3.3Average Risk          5.0          4.42X Average Risk          9.6          7.13X Average Risk          15.0          11.0                      . NonHDL 12/25/2017 94.29   Final   NOTE:  Non-HDL goal should be 30 mg/dL higher than patient's LDL goal (i.e. LDL goal of < 70 mg/dL, would have non-HDL goal of < 100 mg/dL)  . Hemoglobin A1C 12/25/2017 8.4   Final   TSH slightly high.  Will increase levothyroxine to 112 mcg daily and recheck her TFTs at next visit. LDL at goal, HDL low.   CMP normal, except slightly high blood sugars.   Philemon Kingdom, MD PhD [facility] Endocrinology

## 2017-12-25 NOTE — Patient Instructions (Addendum)
Stop Frosted flakes and fruit punch!  Please continue: - Metformin XR 1000 mg 2x a day - Invokana 100 mg daily in am - Lantus 70 units at bedtime  - Humalog 15-20 units before every meal   Please continue  - Levothyroxine 100 mcg daily   Take the thyroid hormone every day, with water, at least 30 minutes before breakfast, separated by at least 4 hours from: - acid reflux medications - calcium - iron - multivitamins  Please return in 3 months with your sugar log

## 2017-12-26 ENCOUNTER — Telehealth: Payer: Self-pay

## 2017-12-26 MED ORDER — LEVOTHYROXINE SODIUM 112 MCG PO TABS: 100.0000 ug | ORAL_TABLET | Freq: Every day | ORAL | 3 refills | Status: DC

## 2017-12-26 NOTE — Telephone Encounter (Signed)
Spoke to patient. Gave results and med instructions. Pt verbalized understanding.  

## 2017-12-26 NOTE — Addendum Note (Signed)
Addended by: Philemon Kingdom on: 12/26/2017 08:53 AM   Modules accepted: Orders

## 2017-12-26 NOTE — Telephone Encounter (Signed)
-----   Message from Philemon Kingdom, MD sent at 12/26/2017  8:53 AM EDT ----- Larey Seat, can you please call pt:  TSH slightly high.  Let's increase levothyroxine to 112 mcg daily and recheck her TFTs at next visit.  I sent a new prescription to her pharmacy. Cholesterol levels are not too bad: LDL at goal, HDL is a little low.   Kidney and liver tests normal, slightly high blood sugars.

## 2018-03-27 ENCOUNTER — Encounter: Payer: Self-pay | Admitting: Internal Medicine

## 2018-03-27 ENCOUNTER — Ambulatory Visit: Payer: BLUE CROSS/BLUE SHIELD | Admitting: Internal Medicine

## 2018-03-27 VITALS — BP 110/60 | HR 101 | Ht 66.0 in | Wt 190.4 lb

## 2018-03-27 DIAGNOSIS — Z794 Long term (current) use of insulin: Secondary | ICD-10-CM

## 2018-03-27 DIAGNOSIS — E890 Postprocedural hypothyroidism: Secondary | ICD-10-CM

## 2018-03-27 DIAGNOSIS — E1165 Type 2 diabetes mellitus with hyperglycemia: Secondary | ICD-10-CM

## 2018-03-27 LAB — POCT GLYCOSYLATED HEMOGLOBIN (HGB A1C): Hemoglobin A1C: 7.4 % — AB (ref 4.0–5.6)

## 2018-03-27 LAB — TSH: TSH: 3.56 u[IU]/mL (ref 0.35–4.50)

## 2018-03-27 LAB — T4, FREE: Free T4: 0.83 ng/dL (ref 0.60–1.60)

## 2018-03-27 MED ORDER — METFORMIN HCL ER 500 MG PO TB24: ORAL_TABLET | ORAL | 3 refills | Status: DC

## 2018-03-27 MED ORDER — FREESTYLE LIBRE 14 DAY SENSOR MISC: 1.0000 | 11 refills | Status: DC

## 2018-03-27 MED ORDER — FREESTYLE LIBRE 14 DAY READER DEVI: 1.0000 | Freq: Once | 1 refills | Status: AC

## 2018-03-27 NOTE — Patient Instructions (Addendum)
Please continue: - Metformin XR 1000 mg 2x a day - Lantus 70 units at bedtime  - Invokana 100 mg but move this in the morning  Please take: - Humalog 15-20 units before every meal,  Including lunch  Try to stop snacks at night, after dinner.  Please stop at the lab.  Please return in 3 months with your sugar log.

## 2018-03-27 NOTE — Addendum Note (Signed)
Addended by: Drucilla Schmidt on: 03/27/2018 02:33 PM   Modules accepted: Orders

## 2018-03-27 NOTE — Progress Notes (Addendum)
Patient ID: Samantha [name], female   DOB: 01/28/1966, 52 y.o.   MRN: [id number]  HPI: Samantha [name] is a 52 y.o.-year-old female, returning for follow-up for DM2, dx 2012, insulin-dependent since 2015, uncontrolled, without long term complications, but with medication noncompliance. Last OV 3 mo ago.  She started to work second shift in 07/2017.  She finishes work at [employer] AM, eats, and goes to sleep at 2-3 AM.  Last hemoglobin A1c was reviewed: Lab Results  Component Value Date   HGBA1C 8.4 12/25/2017   HGBA1C 11.7 04/18/2017   HGBA1C 8.2 08/20/2016  05/27/2014: 12% 02/2014: 9%  She is on: - Metformin XR 1000 mg 2x a day - Invokana 100 mg before b'fast - Lantus 70 units at bedtime  - Humalog 15-20 units before every meal She was on Farxiga x 2 years, but this was stopped b/c weight loss (205 >> 179 lbs), urinary frequency She tried regular metformin >> GI sxs.  Pt is checking sugars many times a day with her Freestyle Libre CGM: Freestyle Libre CGM parameters: - average: 149 + 45.2% - coefficient of variation: 30.3% (19-25%) - time in range:  - low (<70): 2% - normal (70-180): 75% - high (>180): 23%  CBG 25% - 75%: - am:  105-143, 230 >> 124-260 >> n/c >> 110-130 - 2h after b'fast: 279-404 >> n/c >> 195-240 >> n/c >> 100-150 - before lunch: n/c >> 85, 116-266 >> 90-170 >> 120-154 - 2h after lunch: n/c >> 197 >> 123-238 >> n/c >> 150-210 - before dinner:  160-190 >> 146-235 >> n/c >> 130-180 - 2h after dinner: 99 >> n/c >> 194, 245 >> n/c >> 130-200 - bedtime: 164-189, 240 >> n/c >> 206, 393 >> n/c >> 165-225 - nighttime: n/c Lowest sugar was 90 >> 69 (am); unclear at which level she has hypogly awareness Highest sugar was 200 >> 240.  Pt's meals are: - Breakfast: cereal or bisquit - Lunch: hamburger, chinese - Dinner: chicken  - Snacks: 2-3: Frosted flakes! Fruit punch! But cut back on regular drinks .  - no  CKD, last BUN/creatinine:  Lab Results   Component Value Date   BUN 19 12/25/2017   CREATININE 0.80 12/25/2017  02/10/2015: 13/0.83, GFR 86 11/16/2013: 20/0.92, GFR 79  -+ HL; lipid panel: Lab Results  Component Value Date   CHOL 130 12/25/2017   HDL 35.90 (L) 12/25/2017   LDLCALC 77 12/25/2017   TRIG 88.0 12/25/2017   CHOLHDL 4 12/25/2017  02/10/2015: 185/168/44/107  On rosuvastatin  - last eye exam was in 2018: No DR. [facility] at [facility].  - no numbness and tingling in her feet.  Hypothyroidism after RAI treatment for Graves' disease  - h/o med noncompliance  Pt is on levothyroxine 112 mcg daily, taken: - in am - fasting - at least 30 min from b'fast - no Ca, Fe, MVI, PPIs - not on Biotin  Reviewed TFTs: Lab Results  Component Value Date   TSH 5.58 (H) 12/25/2017   TSH 4.47 02/27/2016   TSH 11.19 (H) 05/19/2015   TSH 2.90 10/28/2014   TSH 0.277 (L) 09/13/2014   FREET4 0.84 12/25/2017   FREET4 0.61 02/27/2016   FREET4 0.71 05/19/2015   FREET4 0.79 10/28/2014   FREET4 1.21 09/13/2014  07/28/2013: TSH 1.69  Pt denies: - feeling nodules in neck - hoarseness - dysphagia - choking - SOB with lying down  ROS Constitutional: no weight gain/no weight loss, no fatigue, no subjective hyperthermia, no subjective  hypothermia Eyes: no blurry vision, no xerophthalmia ENT: no sore throat,+ see HPI Cardiovascular: no CP/no SOB/no palpitations/no leg swelling Respiratory: no cough/no SOB/no wheezing Gastrointestinal: no N/no V/no D/no C/no acid reflux Musculoskeletal: no muscle aches/no joint aches Skin: no rashes, no hair loss Neurological: no tremors/no numbness/no tingling/no dizziness  I reviewed pt's medications, allergies, PMH, social hx, family hx, and changes were documented in the history of present illness. Otherwise, unchanged from my initial visit note.  No past medical history on file. Past Surgical History:  Procedure Laterality Date  . CESAREAN SECTION    . UTERINE FIBROID  EMBOLIZATION     Social History   Socioeconomic History  . Marital status: Single    Spouse name: Not on file  . Number of children: Not on file  . Years of education: Not on file  . Highest education level: Not on file  Occupational History  . Not on file  Social Needs  . Financial resource strain: Not on file  . Food insecurity:    Worry: Not on file    Inability: Not on file  . Transportation needs:    Medical: Not on file    Non-medical: Not on file  Tobacco Use  . Smoking status: Never Smoker  . Smokeless tobacco: Never Used  Substance and Sexual Activity  . Alcohol use: No    Alcohol/week: 0.0 oz  . Drug use: Not on file  . Sexual activity: Not on file  Lifestyle  . Physical activity:    Days per week: Not on file    Minutes per session: Not on file  . Stress: Not on file  Relationships  . Social connections:    Talks on phone: Not on file    Gets together: Not on file    Attends religious service: Not on file    Active member of club or organization: Not on file    Attends meetings of clubs or organizations: Not on file    Relationship status: Not on file  . Intimate partner violence:    Fear of current or ex partner: Not on file    Emotionally abused: Not on file    Physically abused: Not on file    Forced sexual activity: Not on file  Other Topics Concern  . Not on file  Social History Narrative  . Not on file   Current Outpatient Medications on File Prior to Visit  Medication Sig Dispense Refill  . insulin lispro (HUMALOG KWIKPEN) 100 UNIT/ML KiwkPen Inject 15-20 units 3 times daily with meals. 15 mL 3  . Insulin Pen Needle (BD PEN NEEDLE NANO U/F) 32G X 4 MM MISC USE TO INJECT INSULIN 4 TIMES DAILY 100 each 5  . INVOKANA 100 MG TABS tablet TAKE 1 TABLET (100 MG TOTAL) BY MOUTH DAILY. 30 tablet 2  . LANTUS SOLOSTAR 100 UNIT/ML Solostar Pen INJECT 70 UNITS INTO THE SKIN AT BEDTIME 30 pen 2  . levothyroxine (SYNTHROID, LEVOTHROID) 112 MCG tablet Take 1  tablet (112 mcg total) by mouth daily. 90 tablet 3  . Probiotic Product (PROBIOTIC DAILY PO) Take 1 capsule by mouth daily.    . rosuvastatin (CRESTOR) 5 MG tablet Take 1 tablet (5 mg total) by mouth at bedtime. 90 tablet 0  . VITAMIN D, ERGOCALCIFEROL, PO Take 1 capsule by mouth daily.     No current facility-administered medications on file prior to visit.    Allergies  Allergen Reactions  . Atorvastatin   . Bupropion   .  Doxepin Hcl    Family History  Problem Relation Age of Onset  . Diabetes Other   . Stroke Other     PE: BP 110/60   Pulse (!) 101   Ht 5\' 6"  (1.676 m)   Wt 190 lb 6.4 oz (86.4 kg)   LMP 07/10/2012   SpO2 96%   BMI 30.73 kg/m  Body mass index is 30.73 kg/m.  Wt Readings from Last 3 Encounters:  03/27/18 190 lb 6.4 oz (86.4 kg)  12/25/17 187 lb (84.8 kg)  06/10/17 193 lb (87.5 kg)   Constitutional: overweight, in NAD Eyes: PERRLA, EOMI, no exophthalmos ENT: moist mucous membranes, no thyromegaly, no cervical lymphadenopathy Cardiovascular: Tachycardia, RR, No MRG Respiratory: CTA B Gastrointestinal: abdomen soft, NT, ND, BS+ Musculoskeletal: no deformities, strength intact in all 4 Skin: moist, warm, no rashes Neurological: no tremor with outstretched hands, DTR normal in all 4  ASSESSMENT: 1. DM2, insulin-dependent, uncontrolled, without long-term complications, but with hyperglycemia  2. Hypothyroidism - postablative for Graves ds.  PLAN:  1. Patient with uncontrolled type 2 diabetes, on basal-bolus insulin regimen, and also metformin and Invokana. She has a history of medication noncompliance, which improved since last visit, but she is still skipping insulin doses. - She is doing a good job using her freestyle libre CGM to check sugars frequently throughout the day >> sugars are at goal in the morning but they increase after lunch.  She tells me that she does not usually take Humalog pen with her at work and I strongly advised her to start  doing so and bolus before every meal, including lunch.  She also takes Invokana in the evening and I advised her to move it in the morning, even if she skips breakfast.  Her sugars started again to increase around 9 PM and this is due to popcorn, soda. We discussed about the need to continue to improve her diet, for now, to start working on her snacks at night, which are carbohydrate rich - I suggested to:  Patient Instructions  Please continue: - Metformin XR 1000 mg 2x a day - Lantus 70 units at bedtime  - Invokana 100 mg but move this in the morning  Please take: - Humalog 15-20 units before every meal,  Including lunch  Try to stop snacks at night, after dinner.  Please stop at the lab.  Please return in 3 months with your sugar log.    - today, HbA1c is 7.4% -improved further - continue checking sugars at different times of the day - check 3x a day, rotating checks - advised for yearly eye exams >> she is not UTD - Return to clinic in 3 mo with sugar log     2. Hypothyroidism - latest thyroid labs reviewed with pt >> TSH high >> we increased her LT4 dose at last OV - she continues on LT4 112 mcg daily - pt feels good on this dose. - we discussed about taking the thyroid hormone every day, with water, >30 minutes before breakfast, separated by >4 hours from acid reflux medications, calcium, iron, multivitamins. Pt. is taking it correctly. - will check thyroid tests today: TSH and fT4 - If labs are abnormal, she will need to return for repeat TFTs in 1.5 months  3. HL - Reviewed latest lipid panel from 12/2017: LDL at goal Lab Results  Component Value Date   CHOL 130 12/25/2017   HDL 35.90 (L) 12/25/2017   LDLCALC 77 12/25/2017   TRIG 88.0  12/25/2017   CHOLHDL 4 12/25/2017  - Continues Crestor without side effects.  Office Visit on 03/27/2018  Component Date Value Ref Range Status  . TSH 03/27/2018 3.56  0.35 - 4.50 uIU/mL Final  . Free T4 03/27/2018 0.83  0.60 - 1.60  ng/dL Final   Comment: Specimens from patients who are undergoing biotin therapy and /or ingesting biotin supplements may contain high levels of biotin.  The higher biotin concentration in these specimens interferes with this Free T4 assay.  Specimens that contain high levels  of biotin may cause false high results for this Free T4 assay.  Please interpret results in light of the total clinical presentation of the patient.    . Hemoglobin A1C 03/27/2018 7.4* 4.0 - 5.6 % Final   TFTs are normal on the current LT4 dose  Philemon Kingdom, MD PhD [facility] Endocrinology

## 2018-03-30 ENCOUNTER — Other Ambulatory Visit: Payer: Self-pay | Admitting: Internal Medicine

## 2018-05-23 ENCOUNTER — Telehealth: Payer: Self-pay | Admitting: Internal Medicine

## 2018-05-23 MED ORDER — CANAGLIFLOZIN 100 MG PO TABS: ORAL_TABLET | ORAL | 2 refills | Status: DC

## 2018-05-23 NOTE — Telephone Encounter (Signed)
Patient dropped off forms from [employer] to be filled out by Dr. Cruzita Lederer and faxed. Please call patient once forms have been completed and faxed. Forms are in Dr. Arman Filter front desk box.

## 2018-05-23 NOTE — Telephone Encounter (Signed)
Patient needs RX for Invokana sent to [place] at [facility] asap. Patient is out of medication.

## 2018-05-23 NOTE — Telephone Encounter (Signed)
FYI

## 2018-05-23 NOTE — Telephone Encounter (Signed)
Done

## 2018-05-26 ENCOUNTER — Telehealth: Payer: Self-pay

## 2018-05-26 NOTE — Telephone Encounter (Signed)
[employer] application completed and faxed for Humalog.  Patient notified. [phone] (home)

## 2018-05-29 ENCOUNTER — Other Ambulatory Visit: Payer: Self-pay | Admitting: Internal Medicine

## 2018-05-30 ENCOUNTER — Telehealth: Payer: Self-pay

## 2018-05-30 NOTE — Telephone Encounter (Signed)
Should we send the Rx's directly to the pharmacy?

## 2018-05-30 NOTE — Telephone Encounter (Signed)
Received fax from [facility] prescription assistant.  Patient denied due to having insurance coverage.

## 2018-05-30 NOTE — Telephone Encounter (Signed)
Left message for patient to return our call at [phone].

## 2018-06-02 NOTE — Telephone Encounter (Signed)
Left message for patient to return our call at [phone].  

## 2018-06-04 NOTE — Telephone Encounter (Signed)
Unable to contact patient.

## 2018-06-10 ENCOUNTER — Emergency Department (HOSPITAL_COMMUNITY): Payer: BLUE CROSS/BLUE SHIELD

## 2018-06-10 ENCOUNTER — Other Ambulatory Visit: Payer: Self-pay

## 2018-06-10 ENCOUNTER — Encounter (HOSPITAL_COMMUNITY): Payer: Self-pay | Admitting: Emergency Medicine

## 2018-06-10 ENCOUNTER — Emergency Department (HOSPITAL_COMMUNITY)
Admission: EM | Admit: 2018-06-10 | Discharge: 2018-06-10 | Disposition: A | Discharge status: Home / Self Care | Payer: BLUE CROSS/BLUE SHIELD | Attending: Emergency Medicine | Admitting: Emergency Medicine

## 2018-06-10 DIAGNOSIS — K5792 Diverticulitis of intestine, part unspecified, without perforation or abscess without bleeding: Secondary | ICD-10-CM | POA: Diagnosis not present

## 2018-06-10 DIAGNOSIS — E039 Hypothyroidism, unspecified: Secondary | ICD-10-CM | POA: Diagnosis not present

## 2018-06-10 DIAGNOSIS — E119 Type 2 diabetes mellitus without complications: Secondary | ICD-10-CM | POA: Diagnosis not present

## 2018-06-10 DIAGNOSIS — Z79899 Other long term (current) drug therapy: Secondary | ICD-10-CM | POA: Diagnosis not present

## 2018-06-10 DIAGNOSIS — Z794 Long term (current) use of insulin: Secondary | ICD-10-CM | POA: Insufficient documentation

## 2018-06-10 DIAGNOSIS — R109 Unspecified abdominal pain: Secondary | ICD-10-CM | POA: Diagnosis present

## 2018-06-10 HISTORY — DX: Type 2 diabetes mellitus without complications: E11.9

## 2018-06-10 LAB — COMPREHENSIVE METABOLIC PANEL
ALBUMIN: 3.8 g/dL (ref 3.5–5.0)
ALT: 21 U/L (ref 0–44)
ANION GAP: 9 (ref 5–15)
AST: 16 U/L (ref 15–41)
Alkaline Phosphatase: 100 U/L (ref 38–126)
BILIRUBIN TOTAL: 0.4 mg/dL (ref 0.3–1.2)
BUN: 16 mg/dL (ref 6–20)
CALCIUM: 9.3 mg/dL (ref 8.9–10.3)
CO2: 24 mmol/L (ref 22–32)
CREATININE: 0.99 mg/dL (ref 0.44–1.00)
Chloride: 108 mmol/L (ref 98–111)
GFR calc Af Amer: >60 mL/min (ref >60)
GFR calc non Af Amer: >60 mL/min (ref >60)
GLUCOSE: 253 mg/dL — AB (ref 70–99)
Potassium: 4 mmol/L (ref 3.5–5.1)
Sodium: 141 mmol/L (ref 135–145)
TOTAL PROTEIN: 7.2 g/dL (ref 6.5–8.1)

## 2018-06-10 LAB — CBC
HCT: 41.2 % (ref 36.0–46.0)
Hemoglobin: 13 g/dL (ref 12.0–15.0)
MCH: 26.6 pg (ref 26.0–34.0)
MCHC: 31.6 g/dL (ref 30.0–36.0)
MCV: 84.4 fL (ref 78.0–100.0)
PLATELETS: 416 10*3/uL — AB (ref 150–400)
RBC: 4.88 MIL/uL (ref 3.87–5.11)
RDW: 13.2 % (ref 11.5–15.5)
WBC: 13.5 10*3/uL — AB (ref 4.0–10.5)

## 2018-06-10 LAB — URINALYSIS, ROUTINE W REFLEX MICROSCOPIC
BILIRUBIN URINE: NEGATIVE
Glucose, UA: NEGATIVE mg/dL
Hgb urine dipstick: NEGATIVE
KETONES UR: NEGATIVE mg/dL
LEUKOCYTES UA: NEGATIVE
NITRITE: NEGATIVE
PROTEIN: NEGATIVE mg/dL
Specific Gravity, Urine: 1.01 (ref 1.005–1.030)
pH: 5.5 (ref 5.0–8.0)

## 2018-06-10 LAB — TSH: TSH: 1.028 u[IU]/mL (ref 0.350–4.500)

## 2018-06-10 LAB — LIPASE, BLOOD: Lipase: 23 U/L (ref 11–51)

## 2018-06-10 MED ORDER — CIPROFLOXACIN HCL 500 MG PO TABS: 500.0000 mg | ORAL_TABLET | Freq: Two times a day (BID) | ORAL | 0 refills | Status: DC

## 2018-06-10 MED ORDER — METRONIDAZOLE 500 MG PO TABS: 500.0000 mg | ORAL_TABLET | Freq: Two times a day (BID) | ORAL | 0 refills | Status: DC

## 2018-06-10 MED ORDER — IOHEXOL 300 MG/ML  SOLN
100.0000 mL | Freq: Once | INTRAMUSCULAR | Status: AC | PRN
  Administered 2018-06-10: 100 mL via INTRAVENOUS

## 2018-06-10 MED ORDER — SODIUM CHLORIDE 0.9 % IV BOLUS
500.0000 mL | Freq: Once | INTRAVENOUS | Status: AC
  Administered 2018-06-10: 500 mL via INTRAVENOUS

## 2018-06-10 NOTE — ED Triage Notes (Signed)
Patient presents ambulatory c/o feeling fatigued and having generalized pain for a few weeks. C/o left sided abdominal pain with nausea. Pt states she saw an UC and was told to establish a PCP but has not been able to get an appt.

## 2018-06-10 NOTE — ED Notes (Signed)
Patient transported to CT 

## 2018-06-10 NOTE — ED Provider Notes (Signed)
[facility] EMERGENCY DEPARTMENT Provider Note   CSN: [id number] Arrival date & time: 06/10/18  1354     History   Chief Complaint Chief Complaint  Patient presents with  . Fatigue  . Abdominal Pain    HPI Samantha [name] is a 52 y.o. female.  HPI Patient presents with several different complaints.  First she has been fatigued over the last few weeks.  Achiness all over.  Some pain in her left lower leg.  Pain in her left neck at times.  Also left side abdominal pain and nausea.  The abdominal pain is been going for the last couple weeks.  Unsure if it changes with eating.  She does not have a primary care doctor.  No diarrhea or constipation.  No fevers.  No weight loss. Past Medical History:  Diagnosis Date  . Diabetes mellitus without complication ([place])    Type 2    Patient Active Problem List   Diagnosis Date Noted  . Uncontrolled type 2 diabetes mellitus with hyperglycemia, with long-term current use of insulin ([place]) 06/10/2017  . Hypothyroidism, postablative 06/24/2014    Past Surgical History:  Procedure Laterality Date  . CESAREAN SECTION    . UTERINE FIBROID EMBOLIZATION       OB History   None      Home Medications    Prior to Admission medications   Medication Sig Start Date End Date Taking? Authorizing Provider  canagliflozin (INVOKANA) 100 MG TABS tablet TAKE 1 TABLET (100 MG TOTAL) BY MOUTH DAILY. 05/23/18  Yes Philemon Kingdom, MD  LANTUS SOLOSTAR 100 UNIT/ML Solostar Pen INJECT 70 UNITS INTO THE SKIN AT BEDTIME 05/30/18  Yes Philemon Kingdom, MD  levothyroxine (SYNTHROID, LEVOTHROID) 100 MCG tablet TAKE 1 TABLET BY MOUTH ONCE DAILY 03/31/18  Yes Philemon Kingdom, MD  metFORMIN (GLUCOPHAGE-XR) 500 MG 24 hr tablet TAKE TWO TABLETS BY MOUTH TWICE DAILY WITH A MEAL 03/27/18  Yes Philemon Kingdom, MD  rosuvastatin (CRESTOR) 5 MG tablet Take 1 tablet (5 mg total) by mouth at bedtime. Patient taking differently: Take 5 mg by mouth  daily.  10/26/16  Yes Philemon Kingdom, MD  VITAMIN D, ERGOCALCIFEROL, PO Take 1 capsule by mouth daily.   Yes [provider]  ciprofloxacin (CIPRO) 500 MG tablet Take 1 tablet (500 mg total) by mouth 2 (two) times daily. 06/10/18   Davonna Belling, MD  Continuous Blood Gluc Sensor (FREESTYLE LIBRE 14 DAY SENSOR) MISC 1 each by Does not apply route every 14 (fourteen) days. Change every 2 weeks 03/27/18   Philemon Kingdom, MD  insulin lispro (HUMALOG KWIKPEN) 100 UNIT/ML KiwkPen Inject 15-20 units 3 times daily with meals. Patient not taking: Reported on 06/10/2018 04/18/17   Philemon Kingdom, MD  Insulin Pen Needle (BD PEN NEEDLE NANO U/F) 32G X 4 MM MISC USE TO INJECT INSULIN 4 TIMES DAILY 11/15/17   Philemon Kingdom, MD  levothyroxine (SYNTHROID, LEVOTHROID) 112 MCG tablet Take 1 tablet (112 mcg total) by mouth daily. Patient not taking: Reported on 06/10/2018 12/26/17   Philemon Kingdom, MD  metroNIDAZOLE (FLAGYL) 500 MG tablet Take 1 tablet (500 mg total) by mouth 2 (two) times daily. 06/10/18   Davonna Belling, MD    Family History Family History  Problem Relation Age of Onset  . Diabetes Other   . Stroke Other     Social History Social History   Tobacco Use  . Smoking status: Never Smoker  . Smokeless tobacco: Never Used  Substance Use Topics  .  Alcohol use: No    Alcohol/week: 0.0 standard drinks  . Drug use: Not on file     Allergies   Atorvastatin; Bupropion; and Doxepin hcl   Review of Systems Review of Systems  Constitutional: Positive for appetite change and fatigue. Negative for unexpected weight change.  HENT: Negative for congestion.   Respiratory: Negative for shortness of breath.   Cardiovascular: Negative for leg swelling.  Gastrointestinal: Positive for abdominal pain.  Genitourinary: Negative for frequency.  Musculoskeletal: Negative for gait problem.  Skin: Negative for rash.  Neurological: Positive for weakness.  Psychiatric/Behavioral:  Negative for confusion.     Physical Exam Updated Vital Signs BP (!) 142/94   Pulse (!) 103   Temp 99 F (37.2 C) (Oral)   Resp 16   Ht 5\' 6"  (1.676 m)   Wt 86.2 kg   LMP 07/10/2012   SpO2 99%   BMI 30.67 kg/m   Physical Exam  Constitutional: She appears well-developed and well-nourished.  Eyes: Pupils are equal, round, and reactive to light.  Cardiovascular: Regular rhythm.  Pulmonary/Chest: Breath sounds normal.  Abdominal: There is no guarding.  Left lower abdominal tenderness without rebound or guarding.  No mass  Neurological: She is alert.  Skin: Skin is warm. Capillary refill takes less than 2 seconds.     ED Treatments / Results  Labs (all labs ordered are listed, but only abnormal results are displayed) Labs Reviewed  COMPREHENSIVE METABOLIC PANEL - Abnormal; Notable for the following components:      Result Value   Glucose, Bld 253 (*)    All other components within normal limits  CBC - Abnormal; Notable for the following components:   WBC 13.5 (*)    Platelets 416 (*)    All other components within normal limits  URINALYSIS, ROUTINE W REFLEX MICROSCOPIC - Abnormal; Notable for the following components:   Color, Urine YELLOW (*)    APPearance CLEAR (*)    All other components within normal limits  LIPASE, BLOOD  TSH    EKG None  Radiology Ct Abdomen Pelvis W Contrast  Result Date: 06/10/2018 CLINICAL DATA:  Generalized abdominal pain EXAM: CT ABDOMEN AND PELVIS WITH CONTRAST TECHNIQUE: Multidetector CT imaging of the abdomen and pelvis was performed using the standard protocol following bolus administration of intravenous contrast. CONTRAST:  165mL OMNIPAQUE IOHEXOL 300 MG/ML  SOLN COMPARISON:  CT 02/20/2010 FINDINGS: Lower chest: Lung bases show no acute infiltrate or effusion. Normal heart size. Hepatobiliary: No focal liver abnormality is seen. No gallstones, gallbladder wall thickening, or biliary dilatation. Pancreas: Unremarkable. No pancreatic  ductal dilatation or surrounding inflammatory changes. Spleen: Normal in size without focal abnormality. Adrenals/Urinary Tract: Adrenal glands are unremarkable. Kidneys are normal, without renal calculi, focal lesion, or hydronephrosis. Bladder is unremarkable. Stomach/Bowel: Stomach is nonenlarged. No dilated small bowel. Focal wall thickening and surrounding moderate inflammatory change at the descending colon with diverticula present. No definite extraluminal gas collection. Vascular/Lymphatic: No significant vascular findings are present. No enlarged abdominal or pelvic lymph nodes. Reproductive: Fibroid uterus.  No adnexal mass. Other: No free air.  Small fluid in the left colic gutter. Musculoskeletal: No acute or significant osseous findings. IMPRESSION: 1. Focal wall thickening with moderate surrounding inflammatory changes involving the descending colon, consistent with acute diverticulitis. No extraluminal gas collection. No evidence for abscess. 2. Small amount of free fluid in the left colic gutter 3. Fibroid uterus Electronically Signed   By: Donavan Foil M.D.   On: 06/10/2018 19:05  Procedures Procedures (including critical care time)  Medications Ordered in ED Medications  sodium chloride 0.9 % bolus 500 mL (0 mLs Intravenous Stopped 06/10/18 1921)  iohexol (OMNIPAQUE) 300 MG/ML solution 100 mL (100 mLs Intravenous Contrast Given 06/10/18 1819)     Initial Impression / Assessment and Plan / ED Course  I have reviewed the triage vital signs and the nursing notes.  Pertinent labs & imaging results that were available during my care of the patient were reviewed by me and considered in my medical decision making (see chart for details).     Patient with fatigue and abdominal pain.  CT scan shows diverticulitis.  Will treat with antibiotics.  Follow-up with PCP and likely GI  Final Clinical Impressions(s) / ED Diagnoses   Final diagnoses:  Diverticulitis    ED Discharge Orders          Ordered    ciprofloxacin (CIPRO) 500 MG tablet  2 times daily     06/10/18 2001    metroNIDAZOLE (FLAGYL) 500 MG tablet  2 times daily     06/10/18 Jacquelyne Balint, MD 06/10/18 2002

## 2018-06-18 ENCOUNTER — Telehealth: Payer: Self-pay | Admitting: Internal Medicine

## 2018-06-18 NOTE — Telephone Encounter (Signed)
Pt called about the prescription faxed over from [facility]. Wants to f/u and see why she  Has not received a call.

## 2018-06-18 NOTE — Telephone Encounter (Signed)
Is there any way we can give her a coupon for Humalog? There is also a generic Humalog now, Admelog. This may be cheaper. If not, would Novolog would be cheaper? She may need to check with her insurance about these.  If not, we may need to use regular insulin (insulin R) from [facility] - this only comes in a vial and is 25-28$ per bottle. 1 bottle has 1000 units.

## 2018-06-18 NOTE — Telephone Encounter (Signed)
See previous phone notes, we tried calling three times.  Patient application for the medication assistance was denied due to her having insurance, I let her know this and she said her copay is 90.00 for insulin lispro (HUMALOG KWIKPEN) 100 UNIT/ML KiwkPen.  Patient would like to know what other options she has.

## 2018-06-23 NOTE — Telephone Encounter (Signed)
Spoke with patient, she will check with insurance and get back to [place].

## 2018-07-01 ENCOUNTER — Other Ambulatory Visit: Payer: Self-pay | Admitting: Internal Medicine

## 2018-07-09 ENCOUNTER — Other Ambulatory Visit: Payer: Self-pay | Admitting: Internal Medicine

## 2018-07-15 ENCOUNTER — Ambulatory Visit: Payer: BLUE CROSS/BLUE SHIELD | Admitting: Internal Medicine

## 2018-07-31 ENCOUNTER — Ambulatory Visit: Payer: BLUE CROSS/BLUE SHIELD | Admitting: Allergy

## 2018-07-31 ENCOUNTER — Encounter: Payer: Self-pay | Admitting: Allergy

## 2018-07-31 ENCOUNTER — Other Ambulatory Visit: Payer: Self-pay | Admitting: Allergy

## 2018-07-31 VITALS — BP 100/70 | HR 100 | Temp 97.7°F | Resp 16 | Ht 65.0 in | Wt 188.0 lb

## 2018-07-31 DIAGNOSIS — L298 Other pruritus: Secondary | ICD-10-CM

## 2018-07-31 DIAGNOSIS — L508 Other urticaria: Secondary | ICD-10-CM

## 2018-07-31 MED ORDER — FAMOTIDINE 20 MG PO TABS: 20.0000 mg | ORAL_TABLET | Freq: Two times a day (BID) | ORAL | 5 refills | Status: DC

## 2018-07-31 MED ORDER — CETIRIZINE HCL 10 MG PO TABS: 10.0000 mg | ORAL_TABLET | Freq: Two times a day (BID) | ORAL | 5 refills | Status: DC

## 2018-07-31 NOTE — Progress Notes (Addendum)
New Patient Note  RE: Samantha [name] MRN: [id number] DOB: 04-Jan-1966 Date of Office Visit: 07/31/2018  Referring provider: Thornell Sartorius, MD Primary care provider: Harlan Stains, MD  Chief Complaint: hives and itching  History of present illness: Samantha [name] is a 52 y.o. female presenting today for consultation for itching and hives.    She had an episode of diverticulosis around the beginning of October (10/5) was seen in the ED and was treated with flagyl and ciprofloxacin.  She states she saw her PCP in f/u from this ED visit and was prescribed another medication but does not recall what this is but states that it made her very itchy.  It appears that the medication she was prescribed was Augmentin per record. She states she did not take the whole course due to the itch.  She went back to PCP and was taken off the Augmentin.  She states she has been having itchiness all over ever since even after stopping.  She also states that if she scratches too much she "welts" up.     She has tried use of benadryl which makes her sleepy.  She was advised to take Zyrtec which she states does help but if she misses a dose the itchiness returns.  She has had prednisone courses for the itch and hives which does help but she has diabetes and states her blood sugar rises too much on it.  She does report warm/hot showers make her feel itchier.  This is the only thing she can think of that worsens the itch/rash.   She states she has had episodes of welts and hives several years ago and states it "went away" on it own.   She states she has not changed soaps/lotions/detergents, no foods worsen itch/rash, no stings, no preceding illnesses.    She does report sneezing, itchy throat and occasional runny nose that is not too bothersome for her and she does not normally take any medication for this.    No history of asthma or eczema.  She states she is able to eat shellfish now without issue.   She has had allergy testing done before with Dr. Shaune Leeks 10-15 years ago.  She recalls being told she was allergic to grass.  She also states the shellfish panel was reactive but she never had any issues with eating shellfish.    Review of systems: Review of Systems  Constitutional: Negative for chills, fever and malaise/fatigue.  HENT: Negative for congestion, ear discharge, ear pain, nosebleeds and sore throat.   Eyes: Negative for pain, discharge and redness.  Respiratory: Negative for cough, shortness of breath and wheezing.   Cardiovascular: Negative for chest pain.  Gastrointestinal: Negative for abdominal pain, constipation, diarrhea, heartburn, nausea and vomiting.  Musculoskeletal: Negative for joint pain.  Skin: Positive for itching and rash.  Neurological: Negative for headaches.    All other systems negative unless noted above in HPI  Past medical history: Past Medical History:  Diagnosis Date  . Diabetes mellitus without complication ([facility])    Type 2  . Urticaria     Past surgical history: Past Surgical History:  Procedure Laterality Date  . CESAREAN SECTION    . UTERINE FIBROID EMBOLIZATION      Family history:  Family History  Problem Relation Age of Onset  . Diabetes Other   . Stroke Other     Social history: Lives in a home with carpeting with electric heating and central cooling.  Dog  in the home.  No concern for water damage, mildew or roaches in the home.  She is a Teacher, early years/pre. Denies smoking history.   Medication List: Allergies as of 07/31/2018      Reactions   Bupropion Hives   Lexapro [escitalopram] Diarrhea, Nausea Only   Augmentin [amoxicillin-pot Clavulanate] Itching   Cyclobenzaprine Other (See Comments)   Excessive sleepiness   Pravastatin Other (See Comments)   Leg cramps/ mylagia   Atorvastatin Other (See Comments)   Leg cramps   Doxepin Hcl Other (See Comments)   Nightmares      Medication List         Accurate as of 07/31/18  6:06 PM. Always use your most recent med list.          canagliflozin 100 MG Tabs tablet Commonly known as:  INVOKANA TAKE 1 TABLET (100 MG TOTAL) BY MOUTH DAILY.   cetirizine 10 MG tablet Commonly known as:  ZYRTEC Take 1 tablet (10 mg total) by mouth 2 (two) times daily.   famotidine 20 MG tablet Commonly known as:  PEPCID Take 1 tablet (20 mg total) by mouth 2 (two) times daily.   FREESTYLE LIBRE 14 DAY SENSOR Misc 1 each by Does not apply route every 14 (fourteen) days. Change every 2 weeks   ibuprofen 800 MG tablet Commonly known as:  ADVIL,MOTRIN ibuprofen 800 mg tablet   levothyroxine 100 MCG tablet Commonly known as:  SYNTHROID, LEVOTHROID TAKE 1 TABLET BY MOUTH ONCE DAILY   metFORMIN 500 MG 24 hr tablet Commonly known as:  GLUCOPHAGE-XR TAKE TWO TABLETS BY MOUTH TWICE DAILY WITH A MEAL   omeprazole 40 MG capsule Commonly known as:  PRILOSEC omeprazole 40 mg capsule,delayed release   rosuvastatin 5 MG tablet Commonly known as:  CRESTOR Take 1 tablet (5 mg total) by mouth at bedtime.   VITAMIN D (ERGOCALCIFEROL) PO Take 1 capsule by mouth daily.       Known medication allergies: Allergies  Allergen Reactions  . Bupropion Hives  . Lexapro [Escitalopram] Diarrhea and Nausea Only  . Augmentin [Amoxicillin-Pot Clavulanate] Itching  . Cyclobenzaprine Other (See Comments)    Excessive sleepiness  . Pravastatin Other (See Comments)    Leg cramps/ mylagia  . Atorvastatin Other (See Comments)    Leg cramps  . Doxepin Hcl Other (See Comments)    Nightmares      Physical examination: Blood pressure 100/70, pulse 100, temperature 97.7 F (36.5 C), temperature source Oral, resp. rate 16, height 5\' 5"  (1.651 m), weight 188 lb (85.3 kg), last menstrual period 07/10/2012, SpO2 99 %.  General: Alert, interactive, in no acute distress. HEENT: PERRLA, TMs pearly gray, turbinates non-edematous without discharge, post-pharynx non  erythematous. Neck: Supple without lymphadenopathy. Lungs: Clear to auscultation without wheezing, rhonchi or rales. {no increased work of breathing. CV: Normal S1, S2 without murmurs. Abdomen: Nondistended, nontender. Skin: Warm and dry, without lesions or rashes. Extremities:  No clubbing, cyanosis or edema. Neuro:   Grossly intact.  Diagnositics/Labs: Allergy testing: environmental allergy skin prick testing is grasses, weeds, trees, alternaria, phoma betae, candida albicans, dust mites She does have degree of dermatographia Allergy testing results were read and interpreted by provider, documented by clinical staff.   Assessment and plan:   Urticaria and Pruritus   - at this time etiology of itching and hives is unknown.  Hives can be caused by a variety of different triggers including illness/infection, foods, medications, stings, exercise, pressure, vibrations, extremes of temperature to name  a few however majority of the time there is no identifiable trigger.     - Augmentin may have been the trigger of the itch and it is listed an a allergy for you.  Recommend performing drug challenge in the future to determine if she has IgE mediated drug allergy.  Until able to challenge continue avoidance of penicillin based antibiotics.     - increase to Zyrtec 10mg  1 tablet twice a day and start Pepcid 20mg  1 tablet twice a day   - environmental allergy skin testing is positive to grasses, weeds, trees, mold, dust mites.  Allergen avoidance measures provided.    Follow-up 2-3 months or sooner if needed  I appreciate the opportunity to take part in Samantha [name]'s care. Please do not hesitate to contact me with questions.  Sincerely,   Prudy Feeler, MD Allergy/Immunology [facility]

## 2018-07-31 NOTE — Patient Instructions (Addendum)
Itching and hives   - at this time etiology of itching and hives is unknown.  Hives can be caused by a variety of different triggers including illness/infection, foods, medications, stings, exercise, pressure, vibrations, extremes of temperature to name a few however majority of the time there is no identifiable trigger.     - Augmentin may have been the trigger of the itch and it is listed an a allergy for you.  Recommend performing drug challenge in the future to determine if you are allergic to this drug or not.  Until able to challenge continue avoidance of penicillin based antibiotics.     - increase to Zyrtec 10mg  1 tablet twice a day and start Pepcid 20mg  1 tablet twice a day   - environmental allergy skin testing is positive to grasses, weeds, trees, mold, dust mites.  Allergen avoidance measures provided.    Follow-up 2-3 months or sooner if needed

## 2018-09-09 ENCOUNTER — Other Ambulatory Visit: Payer: Self-pay | Admitting: Internal Medicine

## 2018-10-02 ENCOUNTER — Other Ambulatory Visit: Payer: Self-pay | Admitting: Internal Medicine

## 2018-10-07 ENCOUNTER — Telehealth: Payer: Self-pay | Admitting: Internal Medicine

## 2018-10-07 MED ORDER — INSULIN GLARGINE 100 UNIT/ML SOLOSTAR PEN: 70.0000 [IU] | PEN_INJECTOR | Freq: Every day | SUBCUTANEOUS | 2 refills | Status: DC

## 2018-10-07 NOTE — Telephone Encounter (Signed)
MEDICATION: Lantus  PHARMACY:  [facility]  IS THIS A 90 DAY SUPPLY :  no  IS PATIENT OUT OF MEDICATION: YES  IF NOT; HOW MUCH IS LEFT: none  LAST APPOINTMENT DATE: @12 /31/2019  NEXT APPOINTMENT DATE:@2 /14/2020  DO WE HAVE YOUR PERMISSION TO LEAVE A DETAILED MESSAGE:  OTHER COMMENTS: Patient is requesting a refill on Lantus, but I do not see it on the meds list.  I did question her about this but she insist that she has an Rx for Lantus   **Let patient know to contact pharmacy at the end of the day to make sure medication is ready. **  ** Please notify patient to allow 48-72 hours to process**  **Encourage patient to contact the pharmacy for refills or they can request refills through [facility]**

## 2018-10-07 NOTE — Telephone Encounter (Signed)
RX sent   Patient notified  

## 2018-10-08 ENCOUNTER — Ambulatory Visit: Payer: BLUE CROSS/BLUE SHIELD | Admitting: Allergy

## 2018-10-08 ENCOUNTER — Encounter: Payer: Self-pay | Admitting: Allergy

## 2018-10-08 VITALS — BP 118/68 | HR 96 | Resp 16

## 2018-10-08 DIAGNOSIS — L508 Other urticaria: Secondary | ICD-10-CM | POA: Diagnosis not present

## 2018-10-08 DIAGNOSIS — L298 Other pruritus: Secondary | ICD-10-CM | POA: Diagnosis not present

## 2018-10-08 NOTE — Patient Instructions (Addendum)
Itching and hives   - at this time etiology of itching and hives is unknown or idiopathic.  Hives can be caused by a variety of different triggers including illness/infection, foods, medications, stings, exercise, pressure, vibrations, extremes of temperature to name a few however majority of the time there is no identifiable trigger.     - Augmentin may have been the trigger of the itch and it is listed as an allergy for you.  Recommend performing drug challenge in the future to determine if you are allergic to this drug or not.  Until able to challenge continue avoidance of penicillin based antibiotics.  We can plan to challenge over the summer or in the fall 2020.     - continue current regimen of Zyrtec 10mg  1 tablet every other day and Pepcid 20mg  1 tablet every other day.  Let us know if you need to increase these doses due to increased symptoms.     - continue avoidance measures for grasses, weeds, trees, mold, dust mites.  Allergen avoidance measures provided.    Follow-up 4 months or sooner if needed

## 2018-10-08 NOTE — Progress Notes (Signed)
Follow-up Note  RE: Samantha [name] MRN: [id number] DOB: 07-Aug-1966 Date of Office Visit: 10/08/2018   History of present illness: Samantha [name] is a 53 y.o. female presenting today for follow-up of urticaria and pruritus.  She was last seen in the office on July 31, 2018 by myself.  She states that since she has been been doing Zyrtec 1 tablet and Pepcid 1 tablet every other day this has been controlling her hives and itching.  She states she started doing the 2 medications as I had recommended she was twice a day however she thought this may have been increasing her blood sugar and that she started to cut back.  She states she has happy with taking the 2 medications every other day as she does not have the hives or itching if she is consistent about taking her medications every other day. Otherwise she states she has been doing well without any major health changes, surgeries or hospitalizations.  She continues to avoid penicillin-based antibiotics at this time.   Review of systems: Review of Systems  Constitutional: Negative for chills, fever and malaise/fatigue.  HENT: Negative for congestion, ear discharge, nosebleeds and sore throat.   Eyes: Negative for pain, discharge and redness.  Respiratory: Negative for cough, shortness of breath and wheezing.   Cardiovascular: Negative for chest pain.  Gastrointestinal: Negative for abdominal pain, constipation, diarrhea, heartburn, nausea and vomiting.  Musculoskeletal: Negative for joint pain.  Skin: Negative for itching and rash.  Neurological: Negative for headaches.    All other systems negative unless noted above in HPI  Past medical/social/surgical/family history have been reviewed and are unchanged unless specifically indicated below.  No changes  Medication List: Allergies as of 10/08/2018      Reactions   Bupropion Hives   Lexapro [escitalopram] Diarrhea, Nausea Only   Augmentin [amoxicillin-pot Clavulanate]  Itching   Cyclobenzaprine Other (See Comments)   Excessive sleepiness   Pravastatin Other (See Comments)   Leg cramps/ mylagia   Atorvastatin Other (See Comments)   Leg cramps   Doxepin Hcl Other (See Comments)   Nightmares      Medication List       Accurate as of October 08, 2018  5:05 PM. Always use your most recent med list.        cetirizine 10 MG tablet Commonly known as:  ZYRTEC Take 1 tablet twice daily as needed   famotidine 20 MG tablet Commonly known as:  PEPCID Take 1 tablet (20 mg total) by mouth 2 (two) times daily.   FREESTYLE LIBRE 14 DAY SENSOR Misc 1 each by Does not apply route every 14 (fourteen) days. Change every 2 weeks   ibuprofen 800 MG tablet Commonly known as:  ADVIL,MOTRIN ibuprofen 800 mg tablet   Insulin Glargine 100 UNIT/ML Solostar Pen Commonly known as:  LANTUS SOLOSTAR Inject 70 Units into the skin at bedtime.   INVOKANA 100 MG Tabs tablet Generic drug:  canagliflozin TAKE 1 TABLET BY MOUTH ONCE DAILY   levothyroxine 100 MCG tablet Commonly known as:  SYNTHROID, LEVOTHROID TAKE 1 TABLET BY MOUTH ONCE DAILY   metFORMIN 500 MG 24 hr tablet Commonly known as:  GLUCOPHAGE-XR TAKE TWO TABLETS BY MOUTH TWICE DAILY WITH A MEAL   omeprazole 40 MG capsule Commonly known as:  PRILOSEC omeprazole 40 mg capsule,delayed release   rosuvastatin 5 MG tablet Commonly known as:  CRESTOR Take 1 tablet (5 mg total) by mouth at bedtime.   VITAMIN D (ERGOCALCIFEROL) PO  Take 1 capsule by mouth daily.       Known medication allergies: Allergies  Allergen Reactions  . Bupropion Hives  . Lexapro [Escitalopram] Diarrhea and Nausea Only  . Augmentin [Amoxicillin-Pot Clavulanate] Itching  . Cyclobenzaprine Other (See Comments)    Excessive sleepiness  . Pravastatin Other (See Comments)    Leg cramps/ mylagia  . Atorvastatin Other (See Comments)    Leg cramps  . Doxepin Hcl Other (See Comments)    Nightmares      Physical  examination: Blood pressure 118/68, pulse 96, resp. rate 16, last menstrual period 07/10/2012, SpO2 100 %.  General: Alert, interactive, in no acute distress. HEENT: PERRLA, TMs pearly gray, turbinates non-edematous without discharge, post-pharynx non erythematous. Neck: Supple without lymphadenopathy. Lungs: Clear to auscultation without wheezing, rhonchi or rales. {no increased work of breathing. CV: Normal S1, S2 without murmurs. Abdomen: Nondistended, nontender. Skin: Warm and dry, without lesions or rashes. Extremities:  No clubbing, cyanosis or edema. Neuro:   Grossly intact.  Diagnositics/Labs: None today  Assessment and plan:   Acute urticaria   - at this time etiology of itching and hives is unknown or idiopathic.  Hives can be caused by a variety of different triggers including illness/infection, foods, medications, stings, exercise, pressure, vibrations, extremes of temperature to name a few however majority of the time there is no identifiable trigger.     - Augmentin may have been the trigger of the itch and it is listed as an allergy for you.  Recommend performing drug challenge in the future to determine if you are allergic to this drug or not.  Until able to challenge continue avoidance of penicillin based antibiotics.  We can plan to challenge over the summer or in the fall 2020.     - continue current regimen of Zyrtec 10mg  1 tablet every other day and Pepcid 20mg  1 tablet every other day.  Let us know if you need to increase these doses due to increased symptoms.     - continue avoidance measures for grasses, weeds, trees, mold, dust mites.  Allergen avoidance measures provided.    Follow-up 4 months or sooner if needed  I appreciate the opportunity to take part in Samantha [name]'s care. Please do not hesitate to contact me with questions.  Sincerely,   Prudy Feeler, MD Allergy/Immunology [facility]

## 2018-10-20 ENCOUNTER — Other Ambulatory Visit: Payer: Self-pay | Admitting: Internal Medicine

## 2018-10-24 ENCOUNTER — Ambulatory Visit: Payer: BLUE CROSS/BLUE SHIELD | Admitting: Internal Medicine

## 2018-11-07 ENCOUNTER — Other Ambulatory Visit: Payer: Self-pay | Admitting: Internal Medicine

## 2018-11-13 ENCOUNTER — Ambulatory Visit: Payer: BLUE CROSS/BLUE SHIELD | Admitting: Internal Medicine

## 2018-11-13 ENCOUNTER — Encounter: Payer: Self-pay | Admitting: Internal Medicine

## 2018-11-13 ENCOUNTER — Other Ambulatory Visit: Payer: Self-pay

## 2018-11-13 VITALS — BP 120/78 | HR 80 | Ht 65.0 in | Wt 185.0 lb

## 2018-11-13 DIAGNOSIS — Z794 Long term (current) use of insulin: Secondary | ICD-10-CM | POA: Diagnosis not present

## 2018-11-13 DIAGNOSIS — E782 Mixed hyperlipidemia: Secondary | ICD-10-CM | POA: Diagnosis not present

## 2018-11-13 DIAGNOSIS — E890 Postprocedural hypothyroidism: Secondary | ICD-10-CM

## 2018-11-13 DIAGNOSIS — E1165 Type 2 diabetes mellitus with hyperglycemia: Secondary | ICD-10-CM | POA: Diagnosis not present

## 2018-11-13 LAB — POCT GLYCOSYLATED HEMOGLOBIN (HGB A1C): HEMOGLOBIN A1C: 10 % — AB (ref 4.0–5.6)

## 2018-11-13 MED ORDER — CANAGLIFLOZIN 100 MG PO TABS: 100.0000 mg | ORAL_TABLET | Freq: Every day | ORAL | 11 refills | Status: DC

## 2018-11-13 MED ORDER — INSULIN ASPART 100 UNIT/ML FLEXPEN: 15.0000 [IU] | PEN_INJECTOR | Freq: Three times a day (TID) | SUBCUTANEOUS | 11 refills | Status: DC

## 2018-11-13 MED ORDER — INSULIN GLARGINE 100 UNIT/ML SOLOSTAR PEN: 60.0000 [IU] | PEN_INJECTOR | Freq: Every day | SUBCUTANEOUS | 3 refills | Status: DC

## 2018-11-13 NOTE — Patient Instructions (Addendum)
Please continue: - Metformin XR 1000 mg 2x a day  Restart: - Invokana 100 mg before b'fast  Change to - Novolog 10-20 units before every meal, including lunch  Please decrease: - Lantus 60 units at bedtime   STOP SWEET TEA!  Please return in 3 months with your sugar log.

## 2018-11-13 NOTE — Progress Notes (Addendum)
Patient ID: Samantha [name], female   DOB: 09/27/1965, 53 y.o.   MRN: [id number]  HPI: Samantha [name] is a 53 y.o.-year-old female, returning for follow-up for DM2, dx 2012, insulin-dependent since 2015, uncontrolled, without long term complications, but with medication noncompliance. Last OV 7 mo ago.  She started to work second shift in 07/2017.  She finishes work at 12 AM.  She has a steroid inj last week. Also, she was on ABx for diverticulitis.  She stopped sodas but started sweet tea.  Last hemoglobin A1c was reviewed: Lab Results  Component Value Date   HGBA1C 7.4 (A) 03/27/2018   HGBA1C 8.4 12/25/2017   HGBA1C 11.7 04/18/2017  05/27/2014: 12% 02/2014: 9%  She is on: - Metformin XR 1000 mg 2x a day - Invokana 100 mg before b'fast >> not in last 2 weeks, ran out - Lantus 70 units at bedtime >> ran out in 09/2018 for 2 weeks - Humalog 15-20 units before every meal,  Including lunch >> ran out months ago! - not covered She was on Farxiga x 2 years, but this was stopped b/c weight loss (205 >> 179 lbs), urinary frequency She tried regular metformin >> GI sxs.  Pt.checks her sugars more than 4 times a day with his CGM.  Freestyle libre CGM parameters: - Average: 149 >> 150 - % active CGM time: 65% of the time - Glucose variability 38.9% (target < or = to 36%) - time in range:  - very low (<54): 4% - low (64-69): 3% - normal range (70-180): 75% >> 59% - high sugars (181-250): 29% - very high sugars (>250): 5%    Lowest sugar was 90 >> 69 (am) >> 40 (libre) - am; it is unclear at which level she has hypoglycemia awareness Highest sugar was 200 >> 240 >> 270.  Pt's meals are: - Breakfast: cereal or bisquit - Lunch: hamburger, chinese - Dinner: chicken  - Snacks: 2-3: Frosted flakes! Fruit punch! But cut back on regular drinks .  -No CKD, last BUN/creatinine:  Lab Results  Component Value Date   BUN 16 06/10/2018   BUN 19 12/25/2017   CREATININE 0.99  06/10/2018   CREATININE 0.80 12/25/2017  02/10/2015: 13/0.83, GFR 86 11/16/2013: 20/0.92, GFR 79  -+ HL; lipid panel: Lab Results  Component Value Date   CHOL 130 12/25/2017   HDL 35.90 (L) 12/25/2017   LDLCALC 77 12/25/2017   TRIG 88.0 12/25/2017   CHOLHDL 4 12/25/2017  02/10/2015: 185/168/44/107  On rosuvastatin.  - last eye exam was in 2019: No DR. [employer] at [facility].  -Denies numbness and tingling in her feet.  Hypothyroidism after RAI treatment for Graves' disease -History of medication noncompliance  Pt is on levothyroxine 100 mcg daily, taken: - in am - fasting - at least 30 min from b'fast - no Ca, Fe, MVI, PPIs - not on Biotin  Reviewed TFTs: Lab Results  Component Value Date   TSH 1.028 06/10/2018   TSH 3.56 03/27/2018   TSH 5.58 (H) 12/25/2017   TSH 4.47 02/27/2016   TSH 11.19 (H) 05/19/2015   FREET4 0.83 03/27/2018   FREET4 0.84 12/25/2017   FREET4 0.61 02/27/2016   FREET4 0.71 05/19/2015   FREET4 0.79 10/28/2014  07/28/2013: TSH 1.69  Pt denies: - feeling nodules in neck - hoarseness - dysphagia - choking - SOB with lying down  ROS Constitutional: no weight gain/no weight loss, no fatigue, no subjective hyperthermia, no subjective hypothermia Eyes: no blurry vision, no xerophthalmia  ENT: no sore throat, + see HPI Cardiovascular: no CP/no SOB/no palpitations/no leg swelling Respiratory: no cough/no SOB/no wheezing Gastrointestinal: no N/no V/no D/no C/no acid reflux Musculoskeletal: no muscle aches/no joint aches Skin: no rashes, no hair loss Neurological: no tremors/no numbness/no tingling/no dizziness  I reviewed pt's medications, allergies, PMH, social hx, family hx, and changes were documented in the history of present illness. Otherwise, unchanged from my initial visit note.  Since last visit, she started sertraline, famotidine, tizanidine.  Past Medical History:  Diagnosis Date  . Diabetes mellitus without complication (HCC)      Type 2  . Urticaria    Past Surgical History:  Procedure Laterality Date  . CESAREAN SECTION    . UTERINE FIBROID EMBOLIZATION     Social History   Socioeconomic History  . Marital status: Single    Spouse name: Not on file  . Number of children: Not on file  . Years of education: Not on file  . Highest education level: Not on file  Occupational History  . Not on file  Social Needs  . Financial resource strain: Not on file  . Food insecurity:    Worry: Not on file    Inability: Not on file  . Transportation needs:    Medical: Not on file    Non-medical: Not on file  Tobacco Use  . Smoking status: Never Smoker  . Smokeless tobacco: Never Used  Substance and Sexual Activity  . Alcohol use: No    Alcohol/week: 0.0 standard drinks  . Drug use: Never  . Sexual activity: Not on file  Lifestyle  . Physical activity:    Days per week: Not on file    Minutes per session: Not on file  . Stress: Not on file  Relationships  . Social connections:    Talks on phone: Not on file    Gets together: Not on file    Attends religious service: Not on file    Active member of club or organization: Not on file    Attends meetings of clubs or organizations: Not on file    Relationship status: Not on file  . Intimate partner violence:    Fear of current or ex partner: Not on file    Emotionally abused: Not on file    Physically abused: Not on file    Forced sexual activity: Not on file  Other Topics Concern  . Not on file  Social History Narrative  . Not on file   Current Outpatient Medications on File Prior to Visit  Medication Sig Dispense Refill  . cetirizine (ZYRTEC) 10 MG tablet Take 1 tablet twice daily as needed 60 tablet 5  . Continuous Blood Gluc Sensor (FREESTYLE LIBRE 14 DAY SENSOR) MISC 1 each by Does not apply route every 14 (fourteen) days. Change every 2 weeks 2 each 11  . famotidine (PEPCID) 20 MG tablet Take 1 tablet (20 mg total) by mouth 2 (two) times daily.  60 tablet 5  . ibuprofen (ADVIL,MOTRIN) 800 MG tablet ibuprofen 800 mg tablet    . Insulin Glargine (LANTUS SOLOSTAR) 100 UNIT/ML Solostar Pen Inject 70 Units into the skin at bedtime. 30 mL 2  . INVOKANA 100 MG TABS tablet Take 1 tablet by mouth once daily 30 tablet 0  . levothyroxine (SYNTHROID, LEVOTHROID) 100 MCG tablet TAKE 1 TABLET BY MOUTH ONCE DAILY 90 tablet 0  . metFORMIN (GLUCOPHAGE-XR) 500 MG 24 hr tablet TAKE TWO TABLETS BY MOUTH TWICE DAILY WITH A MEAL  360 tablet 3  . omeprazole (PRILOSEC) 40 MG capsule omeprazole 40 mg capsule,delayed release    . rosuvastatin (CRESTOR) 5 MG tablet Take 1 tablet (5 mg total) by mouth at bedtime. (Patient taking differently: Take 5 mg by mouth daily. ) 90 tablet 0  . VITAMIN D, ERGOCALCIFEROL, PO Take 1 capsule by mouth daily.     No current facility-administered medications on file prior to visit.    Allergies  Allergen Reactions  . Bupropion Hives  . Lexapro [Escitalopram] Diarrhea and Nausea Only  . Augmentin [Amoxicillin-Pot Clavulanate] Itching  . Cyclobenzaprine Other (See Comments)    Excessive sleepiness  . Pravastatin Other (See Comments)    Leg cramps/ mylagia  . Atorvastatin Other (See Comments)    Leg cramps  . Doxepin Hcl Other (See Comments)    Nightmares    Family History  Problem Relation Age of Onset  . Diabetes Other   . Stroke Other     PE: BP 120/78   Pulse 80   Ht 5\' 5"  (1.651 m)   Wt 185 lb (83.9 kg)   LMP 07/10/2012   SpO2 97%   BMI 30.79 kg/m  Body mass index is 30.79 kg/m.  Wt Readings from Last 3 Encounters:  11/13/18 185 lb (83.9 kg)  07/31/18 188 lb (85.3 kg)  06/10/18 190 lb (86.2 kg)   Constitutional: overweight, in NAD Eyes: PERRLA, EOMI, no exophthalmos ENT: moist mucous membranes, no thyromegaly, no cervical lymphadenopathy Cardiovascular: RRR, No MRG Respiratory: CTA B Gastrointestinal: abdomen soft, NT, ND, BS+ Musculoskeletal: no deformities, strength intact in all 4 Skin:  moist, warm, no rashes Neurological: no tremor with outstretched hands, DTR normal in all 4  ASSESSMENT: 1. DM2, insulin-dependent, uncontrolled, without long-term complications, but with hyperglycemia  2. Hypothyroidism - postablative for Graves ds.  3. HL  PLAN:  1. Patient with uncontrolled type 2 diabetes, on basal/bolus insulin regimen, and also on metformin and SGLT 2 inhibitor.  She has a history of medication noncompliance which improved lately.  At last visit, she was still skipping insulin doses.  She was not taking Humalog with lunch and I strongly advised her to do so since the sugars were increasing after this meal.  At that time, sugars were also high around 9 PM due to snacking after dinner: Popcorn, soda.  I strongly advised her to stop eating snacks after dinner.  However, due to improved compliance, at last visit, HbA1c was better, at 7.4%. -At this visit, sugars are much worse, usually dropping abruptly overnight and then increasing significantly throughout the day.  Upon questioning, she is off Humalog as this was not covered by her insurance.  She was also out of Lantus in January and she is currently out of Invokana for the last 2 weeks.  We discussed that she needs to be more proactive in her diabetes control, but for now, we will reduce the dose of Lantus to avoid abrupt drops overnight and refill her prescription.  We will also restart Invokana.  Says NovoLog appears to be covered by her insurance, I will send this to her pharmacy and given a coupon card.  I also advised her to stop sweet tea.  She did stop soda but unfortunately switched to sweet tea, which is also not conducive to good diabetes control. - I suggested to:  Patient Instructions  Please continue: - Metformin XR 1000 mg 2x a day  Restart: - Invokana 100 mg before b'fast  Change to - Novolog 10-20 units  before every meal, including lunch  Please decrease: - Lantus 60 units at bedtime   STOP SWEET  TEA!  Please return in 3 months with your sugar log.   - today, HbA1c is 10% (much higher) - continue checking sugars at different times of the day - check 4x a day, rotating checks - advised for yearly eye exams >> she is UTD - Return to clinic in 3-4 mo with sugar log     2. Hypothyroidism - latest thyroid labs reviewed with pt >> normal 03/2018 and 06/2018 - she continues on LT4 100 mcg daily - pt feels good on this dose. - we discussed about taking the thyroid hormone every day, with water, >30 minutes before breakfast, separated by >4 hours from acid reflux medications, calcium, iron, multivitamins. Pt. is taking it correctly.  3. HL  - Reviewed latest lipid panel from 12/2017: LDL at goal, HDL low Lab Results  Component Value Date   CHOL 130 12/25/2017   HDL 35.90 (L) 12/25/2017   LDLCALC 77 12/25/2017   TRIG 88.0 12/25/2017   CHOLHDL 4 12/25/2017  - Continues crestor without side effects.  Philemon Kingdom, MD PhD [facility] Endocrinology

## 2019-02-11 ENCOUNTER — Other Ambulatory Visit: Payer: Self-pay | Admitting: Internal Medicine

## 2019-02-17 ENCOUNTER — Other Ambulatory Visit: Payer: Self-pay | Admitting: Internal Medicine

## 2019-03-11 ENCOUNTER — Telehealth: Payer: Self-pay | Admitting: Nutrition

## 2019-03-11 NOTE — Telephone Encounter (Signed)
Discussed the advantages of being able to view her blood sugar readings on line.  I explained that I sent her a link with directions on how to do this.  She agreed to look at it, and decide then.  She wished to come in for her visit on Monday to discuss her medications with Dr. Cruzita Lederer.  She was encouraged to try to do this this weekend and if she has questions, I will be here to help her with it.

## 2019-03-12 ENCOUNTER — Other Ambulatory Visit: Payer: Self-pay

## 2019-03-16 ENCOUNTER — Ambulatory Visit: Payer: BC Managed Care – PPO | Admitting: Internal Medicine

## 2019-03-16 ENCOUNTER — Encounter: Payer: Self-pay | Admitting: Internal Medicine

## 2019-03-16 ENCOUNTER — Other Ambulatory Visit: Payer: Self-pay

## 2019-03-16 VITALS — BP 120/88 | HR 90 | Ht 65.0 in | Wt 189.0 lb

## 2019-03-16 DIAGNOSIS — Z794 Long term (current) use of insulin: Secondary | ICD-10-CM

## 2019-03-16 DIAGNOSIS — E782 Mixed hyperlipidemia: Secondary | ICD-10-CM | POA: Diagnosis not present

## 2019-03-16 DIAGNOSIS — E890 Postprocedural hypothyroidism: Secondary | ICD-10-CM

## 2019-03-16 DIAGNOSIS — E1165 Type 2 diabetes mellitus with hyperglycemia: Secondary | ICD-10-CM

## 2019-03-16 LAB — POCT GLYCOSYLATED HEMOGLOBIN (HGB A1C): Hemoglobin A1C: 12.1 % — AB (ref 4.0–5.6)

## 2019-03-16 MED ORDER — NOVOLOG FLEXPEN 100 UNIT/ML ~~LOC~~ SOPN: 20.0000 [IU] | PEN_INJECTOR | Freq: Three times a day (TID) | SUBCUTANEOUS | 11 refills | Status: DC

## 2019-03-16 MED ORDER — LANTUS SOLOSTAR 100 UNIT/ML ~~LOC~~ SOPN: 35.0000 [IU] | PEN_INJECTOR | Freq: Two times a day (BID) | SUBCUTANEOUS | 3 refills | Status: DC

## 2019-03-16 MED ORDER — FARXIGA 10 MG PO TABS: 10.0000 mg | ORAL_TABLET | Freq: Every day | ORAL | 5 refills | Status: DC

## 2019-03-16 NOTE — Patient Instructions (Addendum)
Please change: - Novolog 20-25 units before every meal, including lunch - Lantus 35 units 2x a day  Try to add: - Farxiga 10 mg before b'fast  STOP SODAS and SWEET TEA! STOP CHIPS!  Please return in 3 months with your sugar log.

## 2019-03-16 NOTE — Addendum Note (Signed)
Addended by: Cardell Peach I on: 03/16/2019 11:24 AM   Modules accepted: Orders

## 2019-03-16 NOTE — Progress Notes (Signed)
Patient ID: Samantha [name], female   DOB: 25-Jun-1966, 53 y.o.   MRN: [id number]  HPI: Samantha [name] is a 53 y.o.-year-old female, returning for follow-up for DM2, dx 2012, insulin-dependent since 2015, uncontrolled, without long term complications, but with medication noncompliance. Last OV 4 mo ago.  Since last OV, she had a very poor diet and stopped Metformin and Invokana 2/2 negative publicity.  Last hemoglobin A1c was reviewed: Lab Results  Component Value Date   HGBA1C 10.0 (A) 11/13/2018   HGBA1C 7.4 (A) 03/27/2018   HGBA1C 8.4 12/25/2017  05/27/2014: 12% 02/2014: 9%  She was on: - Metformin XR 1000 mg 2x a day - Invokana 100 mg before b'fast >> not in last 2 weeks, ran out - Lantus 70 units at bedtime >> ran out in 09/2018 for 2 weeks - Humalog 15-20 units before every meal,  Including lunch >> ran out months ago! - not covered She was on Farxiga x 2 years, but this was stopped b/c weight loss (205 >> 179 lbs), urinary frequency She tried regular metformin >> GI sxs.  At last visit, I rec'd the following regimen: - >> stopped ->> stopped  - Novolog 10-20 units before every meal, including lunch >> 20-20-20 units - Lantus 60 >> 70 units at bedtime  Pt.checks her sugars more than 4 times a day with her CGM. However, at this visit, we tried to download her CGM and she only had 5% active sensor time in the last 2 weeks.  She is not checking sugars manually.     Lowest sugar was 40 (libre) - am >> ?; it is unclear at which level she has hypoglycemia awareness Highest sugar was 270 >> 400.  Pt's meals are: - Breakfast: cereal or bisquit - Lunch: hamburger, chinese - Dinner: chicken  - Snacks: 2-3: Frosted flakes! Fruit punch! But cut back on regular drinks .  - No CKD, last BUN/creatinine: 01/29/2019: 14/0.94 Lab Results  Component Value Date   BUN 16 06/10/2018   BUN 19 12/25/2017   CREATININE 0.99 06/10/2018   CREATININE 0.80 12/25/2017  02/10/2015:  13/0.83, GFR 86 11/16/2013: 20/0.92, GFR 79  -+ HL; lipid panel: 01/29/2019: 226/265/48/125 Lab Results  Component Value Date   CHOL 130 12/25/2017   HDL 35.90 (L) 12/25/2017   LDLCALC 77 12/25/2017   TRIG 88.0 12/25/2017   CHOLHDL 4 12/25/2017  02/10/2015: 185/168/44/107  On crestor.  - last eye exam was in 06/2018: No DR reportedly - [facility].  - no numbness and tingling in her feet.  Hypothyroidism after RAI treatment for Graves' disease -h/o med noncompliance  Pt is on levothyroxine 100 mcg daily, taken: - in am - fasting - at least 30 min from b'fast - no Ca, Fe, MVI, PPIs - not on Biotin  Reviewed TFTs: Lab Results  Component Value Date   TSH 1.028 06/10/2018   TSH 3.56 03/27/2018   TSH 5.58 (H) 12/25/2017   TSH 4.47 02/27/2016   TSH 11.19 (H) 05/19/2015   FREET4 0.83 03/27/2018   FREET4 0.84 12/25/2017   FREET4 0.61 02/27/2016   FREET4 0.71 05/19/2015   FREET4 0.79 10/28/2014  07/28/2013: TSH 1.69  Pt denies: - feeling nodules in neck - hoarseness - dysphagia - choking - SOB with lying down  ROS Constitutional: no weight gain/no weight loss, no fatigue, no subjective hyperthermia, no subjective hypothermia Eyes: no blurry vision, no xerophthalmia ENT: no sore throat, + see HPI Cardiovascular: no CP/no SOB/no palpitations/no leg swelling  Respiratory: no cough/no SOB/no wheezing Gastrointestinal: no N/no V/no D/no C/no acid reflux Musculoskeletal: no muscle aches/no joint aches Skin: no rashes, no hair loss Neurological: no tremors/no numbness/no tingling/+ dizziness  I reviewed pt's medications, allergies, PMH, social hx, family hx, and changes were documented in the history of present illness. Otherwise, unchanged from my initial visit note.  Past Medical History:  Diagnosis Date  . Diabetes mellitus without complication (HCC)    Type 2  . Urticaria    Past Surgical History:  Procedure Laterality Date  . CESAREAN SECTION    .  UTERINE FIBROID EMBOLIZATION     Social History   Socioeconomic History  . Marital status: Single    Spouse name: Not on file  . Number of children: Not on file  . Years of education: Not on file  . Highest education level: Not on file  Occupational History  . Not on file  Social Needs  . Financial resource strain: Not on file  . Food insecurity    Worry: Not on file    Inability: Not on file  . Transportation needs    Medical: Not on file    Non-medical: Not on file  Tobacco Use  . Smoking status: Never Smoker  . Smokeless tobacco: Never Used  Substance and Sexual Activity  . Alcohol use: No    Alcohol/week: 0.0 standard drinks  . Drug use: Never  . Sexual activity: Not on file  Lifestyle  . Physical activity    Days per week: Not on file    Minutes per session: Not on file  . Stress: Not on file  Relationships  . Social Herbalist on phone: Not on file    Gets together: Not on file    Attends religious service: Not on file    Active member of club or organization: Not on file    Attends meetings of clubs or organizations: Not on file    Relationship status: Not on file  . Intimate partner violence    Fear of current or ex partner: Not on file    Emotionally abused: Not on file    Physically abused: Not on file    Forced sexual activity: Not on file  Other Topics Concern  . Not on file  Social History Narrative  . Not on file   Current Outpatient Medications on File Prior to Visit  Medication Sig Dispense Refill  . cetirizine (ZYRTEC) 10 MG tablet Take 1 tablet twice daily as needed 60 tablet 5  . Continuous Blood Gluc Sensor (FREESTYLE LIBRE 14 DAY SENSOR) MISC USE TO CHECK BLOOD SUGAR EVERY 14 DAYS. CHANGE EVERY 2 WEEKS. 2 each 0  . famotidine (PEPCID) 20 MG tablet Take 1 tablet (20 mg total) by mouth 2 (two) times daily. 60 tablet 5  . ibuprofen (ADVIL,MOTRIN) 800 MG tablet ibuprofen 800 mg tablet    . insulin aspart (NOVOLOG FLEXPEN) 100 UNIT/ML  FlexPen Inject 15-20 Units into the skin 3 (three) times daily with meals. 15 mL 11  . Insulin Glargine (LANTUS SOLOSTAR) 100 UNIT/ML Solostar Pen Inject 60 Units into the skin at bedtime. 30 mL 3  . levothyroxine (SYNTHROID) 100 MCG tablet Take 1 tablet by mouth once daily 90 tablet 0  . omeprazole (PRILOSEC) 40 MG capsule omeprazole 40 mg capsule,delayed release    . rosuvastatin (CRESTOR) 5 MG tablet Take 1 tablet (5 mg total) by mouth at bedtime. (Patient taking differently: Take 5 mg by mouth daily. )  90 tablet 0  . VITAMIN D, ERGOCALCIFEROL, PO Take 1 capsule by mouth daily.    . canagliflozin (INVOKANA) 100 MG TABS tablet Take 1 tablet (100 mg total) by mouth daily. (Patient not taking: Reported on 03/16/2019) 30 tablet 11  . metFORMIN (GLUCOPHAGE-XR) 500 MG 24 hr tablet TAKE TWO TABLETS BY MOUTH TWICE DAILY WITH A MEAL (Patient not taking: Reported on 03/16/2019) 360 tablet 3   No current facility-administered medications on file prior to visit.    Allergies  Allergen Reactions  . Bupropion Hives  . Lexapro [Escitalopram] Diarrhea and Nausea Only  . Augmentin [Amoxicillin-Pot Clavulanate] Itching  . Cyclobenzaprine Other (See Comments)    Excessive sleepiness  . Pravastatin Other (See Comments)    Leg cramps/ mylagia  . Atorvastatin Other (See Comments)    Leg cramps  . Doxepin Hcl Other (See Comments)    Nightmares    Family History  Problem Relation Age of Onset  . Diabetes Other   . Stroke Other     PE: BP 120/88   Pulse 90   Ht 5\' 5"  (1.651 m) Comment: measured  Wt 189 lb (85.7 kg)   LMP 07/10/2012   SpO2 98%   BMI 31.45 kg/m  Body mass index is 31.45 kg/m.  Wt Readings from Last 3 Encounters:  03/16/19 189 lb (85.7 kg)  11/13/18 185 lb (83.9 kg)  07/31/18 188 lb (85.3 kg)   Constitutional: overweight, in NAD Eyes: PERRLA, EOMI, no exophthalmos ENT: moist mucous membranes, no thyromegaly, no cervical lymphadenopathy Cardiovascular: RRR, No MRG Respiratory:  CTA B Gastrointestinal: abdomen soft, NT, ND, BS+ Musculoskeletal: no deformities, strength intact in all 4 Skin: moist, warm, no rashes Neurological: no tremor with outstretched hands, DTR normal in all 4  ASSESSMENT: 1. DM2, insulin-dependent, uncontrolled, without long-term complications, but with hyperglycemia  2. Hypothyroidism - postablative for Graves ds.  3. HL  PLAN:  1.  Patient with uncontrolled type 2 diabetes, on basal-bolus insulin regimen and previously also on metformin and SGLT 2 inhibitor, now stopped due to negative publicity.  At last visit, sugars were much higher and we discussed about the need to improve her diet and compliance with medications. -At this visit, her sugars are much higher although she is not checking consistently at home with her CGM.  She has a very poor diet and she still drinks sweet tea and regular sodas and snacks on popcorn, chips, etc.  She uses takeout for a lot of her meals and this is mostly fast food.  We discussed that if she continues in this way, it would be almost impossible to control her diabetes.  I advised her to stop sweet drinks and the snacks and to try to not rely on fast food. -We will also increase her Lantus and split it into doses and also increase her NovoLog.  States she is worried about Invokana side effects, will try [place], which she accepts.  I would have liked to use a GLP-1 receptor agonist for her, but she complains of some left upper quadrant pain, which is under investigation, so we will not use this class of medicines for now. - I suggested to:  Patient Instructions  Please change: - Novolog 20-25 units before every meal, including lunch - Lantus 35 units 2x a day  Try to add: - Farxiga 10 mg before b'fast  STOP SODAS and SWEET TEA! STOP CHIPS!  Please return in 3 months with your sugar log.   - today, HbA1c is  12.1% (even higher)  - continue checking sugars at different times of the day - check 4x a day  with CGM, rotating checks - advised for yearly eye exams >> she is UTD - Return to clinic in 3 mo with sugar log      2. Hypothyroidism - latest thyroid labs reviewed with pt >> normal 06/2018 - she continues on LT4 100 mcg daily - pt feels good on this dose. - we discussed about taking the thyroid hormone every day, with water, >30 minutes before breakfast, separated by >4 hours from acid reflux medications, calcium, iron, multivitamins. Pt. is taking it correctly. -We will check her TFTs at next visit  3. HL  - Reviewed latest lipid panel from 12/2017: HDL low, LDL at goal Lab Results  Component Value Date   CHOL 130 12/25/2017   HDL 35.90 (L) 12/25/2017   LDLCALC 77 12/25/2017   TRIG 88.0 12/25/2017   CHOLHDL 4 12/25/2017  - Continues Crestor without side effects. - discussed abut cutting down on fast food and sweet drinks  Philemon Kingdom, MD PhD [facility] Endocrinology

## 2019-03-20 ENCOUNTER — Other Ambulatory Visit: Payer: Self-pay | Admitting: Internal Medicine

## 2019-04-21 ENCOUNTER — Other Ambulatory Visit: Payer: Self-pay | Admitting: Internal Medicine

## 2019-04-23 ENCOUNTER — Telehealth: Payer: Self-pay | Admitting: Internal Medicine

## 2019-04-23 NOTE — Telephone Encounter (Signed)
Pt wanted to let us know she has started back on her metformin, started back 2 weeks ago after speaking with the pharmacy. The metformin they supplied her was not a part of the recall

## 2019-05-07 ENCOUNTER — Telehealth: Payer: Self-pay | Admitting: Internal Medicine

## 2019-05-13 MED ORDER — METFORMIN HCL ER 500 MG PO TB24: 1000.0000 mg | ORAL_TABLET | Freq: Every day | ORAL | 1 refills | Status: DC

## 2019-05-13 NOTE — Telephone Encounter (Signed)
Patient has started she's back on this RX. Please refill.

## 2019-05-13 NOTE — Addendum Note (Signed)
Addended by: Cardell Peach I on: 05/13/2019 12:53 PM   Modules accepted: Orders

## 2019-05-13 NOTE — Telephone Encounter (Signed)
Please advise, I do not see mention of restarting this.

## 2019-05-13 NOTE — Telephone Encounter (Signed)
Okay to refill it.  I am not sure exactly what dose she is using now but in the past she was on 1000 mg twice a day.  We can send this.

## 2019-05-13 NOTE — Telephone Encounter (Signed)
RX sent

## 2019-05-17 ENCOUNTER — Other Ambulatory Visit: Payer: Self-pay | Admitting: Internal Medicine

## 2019-05-19 ENCOUNTER — Other Ambulatory Visit: Payer: Self-pay | Admitting: Internal Medicine

## 2019-06-12 ENCOUNTER — Other Ambulatory Visit: Payer: Self-pay | Admitting: Internal Medicine

## 2019-06-12 ENCOUNTER — Other Ambulatory Visit: Payer: Self-pay | Admitting: Family Medicine

## 2019-06-12 DIAGNOSIS — Z1231 Encounter for screening mammogram for malignant neoplasm of breast: Secondary | ICD-10-CM

## 2019-06-16 ENCOUNTER — Other Ambulatory Visit: Payer: Self-pay

## 2019-06-18 ENCOUNTER — Other Ambulatory Visit: Payer: Self-pay

## 2019-06-18 ENCOUNTER — Encounter: Payer: Self-pay | Admitting: Internal Medicine

## 2019-06-18 ENCOUNTER — Ambulatory Visit: Payer: BC Managed Care – PPO | Admitting: Internal Medicine

## 2019-06-18 VITALS — BP 120/82 | HR 95 | Ht 65.0 in | Wt 192.0 lb

## 2019-06-18 DIAGNOSIS — E1165 Type 2 diabetes mellitus with hyperglycemia: Secondary | ICD-10-CM

## 2019-06-18 DIAGNOSIS — E782 Mixed hyperlipidemia: Secondary | ICD-10-CM

## 2019-06-18 DIAGNOSIS — E890 Postprocedural hypothyroidism: Secondary | ICD-10-CM

## 2019-06-18 DIAGNOSIS — Z794 Long term (current) use of insulin: Secondary | ICD-10-CM

## 2019-06-18 LAB — POCT GLYCOSYLATED HEMOGLOBIN (HGB A1C): Hemoglobin A1C: 10.3 % — AB (ref 4.0–5.6)

## 2019-06-18 LAB — TSH: TSH: 1.95 u[IU]/mL (ref 0.35–4.50)

## 2019-06-18 LAB — T4, FREE: Free T4: 0.76 ng/dL (ref 0.60–1.60)

## 2019-06-18 NOTE — Patient Instructions (Addendum)
Please continue: - Novolog 20-25 units before every meal, including lunch - Metformin 1000 mg 2x a day with meals - Farxiga 10 mg before b'fast  Please split: - Lantus 30 units in am and 40 units at night  STOP SODAS and SWEET TEA! STOP CHIPS!  Please return in 3 months with your sugar log.

## 2019-06-18 NOTE — Progress Notes (Signed)
Patient ID: Samantha [name], female   DOB: 10-08-1965, 53 y.o.   MRN: [id number]  HPI: Samantha [name] is a 53 y.o.-year-old female, returning for follow-up for DM2, dx 2012, insulin-dependent since 2015, uncontrolled, without long term complications, but with medication noncompliance. Last OV 3 months ago.   At last visit, she stopped metformin and Invokana due to negative publicity.  Her sugars were extremely high so we restarted metformin ER and added back [place].  Last hemoglobin A1c was reviewed: Lab Results  Component Value Date   HGBA1C 12.1 (A) 03/16/2019   HGBA1C 10.0 (A) 11/13/2018   HGBA1C 7.4 (A) 03/27/2018  05/27/2014: 12% 02/2014: 9%  She is now on: - >> stopped >> restarted Metformin ER 1000 mg twice a day  ->> stopped >> started Farxiga 10 mg daily in a.m. - Novolog 10-20 units before every meal, including lunch >> 20-20-20 units >> 20-25 units before every meal, including lunch - Lantus 60 >> 70 units at bedtime  (forgot to split it) She was on Farxiga x 2 years in the past, but this was stopped b/c weight loss (205 >> 179 lbs), urinary frequency She tried regular metformin >> GI sxs.  She checks her sugars more than 4 times a day with her freestyle libre CGM.  Freestyle libre CGM parameters: - Average: 207 - % active CGM time: 25% of the time - Glucose variability 36.6% (target < or = to 36%) - time in range:  - very low (<54): 0% - low (54-69): 1% - normal range (70-180): 36% - high sugars (181-250): 38% - very high sugars (>250): 25%    Prev.:   Lowest sugar was 40 (libre) - am >> 70;  It is unclear at which level she has hypoglycemia awareness. Highest sugar was 270 >> 400 >> 300s.  Pt's meals are: - Breakfast: cereal or bisquit - Lunch: hamburger, chinese - Dinner: chicken  - Snacks: 2-3: Frosted flakes! Fruit punch! She did not stop sodas or other sweet drinks since last visit.  She still needs chips and other snacks.  -No CKD, last  BUN/creatinine: 01/29/2019: 14/0.94 Lab Results  Component Value Date   BUN 16 06/10/2018   BUN 19 12/25/2017   CREATININE 0.99 06/10/2018   CREATININE 0.80 12/25/2017  02/10/2015: 13/0.83, GFR 86 11/16/2013: 20/0.92, GFR 79  -+ HL; lipid panel: 01/29/2019: 226/265/48/125 Lab Results  Component Value Date   CHOL 130 12/25/2017   HDL 35.90 (L) 12/25/2017   LDLCALC 77 12/25/2017   TRIG 88.0 12/25/2017   CHOLHDL 4 12/25/2017  02/10/2015: 185/168/44/107  On Crestor.  - last eye exam was in 06/2018: No DR reportedly- [facility].  - no numbness and tingling in her feet.  Hypothyroidism after RAI treatment for Graves' disease -History of medication noncompliance  Pt is on levothyroxine 100 mcg daily, taken: - in am - fasting - at least 30 min from b'fast - no Ca, Fe, MVI, PPIs - not on Biotin  Reviewed TFTs: Lab Results  Component Value Date   TSH 1.028 06/10/2018   TSH 3.56 03/27/2018   TSH 5.58 (H) 12/25/2017   TSH 4.47 02/27/2016   TSH 11.19 (H) 05/19/2015   FREET4 0.83 03/27/2018   FREET4 0.84 12/25/2017   FREET4 0.61 02/27/2016   FREET4 0.71 05/19/2015   FREET4 0.79 10/28/2014  07/28/2013: TSH 1.69  Pt denies: - feeling nodules in neck - hoarseness - dysphagia - choking - SOB with lying down  ROS Constitutional: no weight gain/no  weight loss, no fatigue, no subjective hyperthermia, no subjective hypothermia Eyes: no blurry vision, no xerophthalmia ENT: no sore throat, + see HPI Cardiovascular: no CP/no SOB/no palpitations/no leg swelling Respiratory: no cough/no SOB/no wheezing Gastrointestinal: no N/no V/no D/no C/no acid reflux Musculoskeletal: no muscle aches/no joint aches Skin: no rashes, no hair loss Neurological: no tremors/no numbness/no tingling/no dizziness  I reviewed pt's medications, allergies, PMH, social hx, family hx, and changes were documented in the history of present illness. Otherwise, unchanged from my initial visit note.   Past Medical History:  Diagnosis Date  . Diabetes mellitus without complication (HCC)    Type 2  . Urticaria    Past Surgical History:  Procedure Laterality Date  . CESAREAN SECTION    . UTERINE FIBROID EMBOLIZATION     Social History   Socioeconomic History  . Marital status: Single    Spouse name: Not on file  . Number of children: Not on file  . Years of education: Not on file  . Highest education level: Not on file  Occupational History  . Not on file  Social Needs  . Financial resource strain: Not on file  . Food insecurity    Worry: Not on file    Inability: Not on file  . Transportation needs    Medical: Not on file    Non-medical: Not on file  Tobacco Use  . Smoking status: Never Smoker  . Smokeless tobacco: Never Used  Substance and Sexual Activity  . Alcohol use: No    Alcohol/week: 0.0 standard drinks  . Drug use: Never  . Sexual activity: Not on file  Lifestyle  . Physical activity    Days per week: Not on file    Minutes per session: Not on file  . Stress: Not on file  Relationships  . Social Herbalist on phone: Not on file    Gets together: Not on file    Attends religious service: Not on file    Active member of club or organization: Not on file    Attends meetings of clubs or organizations: Not on file    Relationship status: Not on file  . Intimate partner violence    Fear of current or ex partner: Not on file    Emotionally abused: Not on file    Physically abused: Not on file    Forced sexual activity: Not on file  Other Topics Concern  . Not on file  Social History Narrative  . Not on file   Current Outpatient Medications on File Prior to Visit  Medication Sig Dispense Refill  . cetirizine (ZYRTEC) 10 MG tablet Take 1 tablet twice daily as needed 60 tablet 5  . Continuous Blood Gluc Sensor (FREESTYLE LIBRE 14 DAY SENSOR) MISC USE TO CHECK BLOOD SUGAR EVERY 14 DAYS, CHANGE EVERY 2 WEEKS 2 each 4  . dapagliflozin  propanediol (FARXIGA) 10 MG TABS tablet Take 10 mg by mouth daily. 30 tablet 5  . famotidine (PEPCID) 20 MG tablet Take 1 tablet (20 mg total) by mouth 2 (two) times daily. 60 tablet 5  . ibuprofen (ADVIL,MOTRIN) 800 MG tablet ibuprofen 800 mg tablet    . insulin aspart (NOVOLOG FLEXPEN) 100 UNIT/ML FlexPen Inject 20-25 Units into the skin 3 (three) times daily with meals. 15 mL 11  . Insulin Glargine (LANTUS SOLOSTAR) 100 UNIT/ML Solostar Pen Inject 35 Units into the skin 2 (two) times daily. 30 mL 3  . Insulin Pen Needle (BD PEN  NEEDLE NANO U/F) 32G X 4 MM MISC USE TO INJECT INSULIN FOUR TIMES DAILY. 100 each 0  . levothyroxine (SYNTHROID) 100 MCG tablet Take 1 tablet by mouth once daily 90 tablet 0  . metFORMIN (GLUCOPHAGE XR) 500 MG 24 hr tablet Take 2 tablets (1,000 mg total) by mouth daily with breakfast. 180 tablet 1  . omeprazole (PRILOSEC) 40 MG capsule omeprazole 40 mg capsule,delayed release    . rosuvastatin (CRESTOR) 5 MG tablet Take 1 tablet (5 mg total) by mouth at bedtime. (Patient taking differently: Take 5 mg by mouth daily. ) 90 tablet 0  . VITAMIN D, ERGOCALCIFEROL, PO Take 1 capsule by mouth daily.     No current facility-administered medications on file prior to visit.    Allergies  Allergen Reactions  . Bupropion Hives  . Lexapro [Escitalopram] Diarrhea and Nausea Only  . Augmentin [Amoxicillin-Pot Clavulanate] Itching  . Cyclobenzaprine Other (See Comments)    Excessive sleepiness  . Pravastatin Other (See Comments)    Leg cramps/ mylagia  . Atorvastatin Other (See Comments)    Leg cramps  . Doxepin Hcl Other (See Comments)    Nightmares    Family History  Problem Relation Age of Onset  . Diabetes Other   . Stroke Other     PE: BP 120/82   Pulse 95   Ht 5\' 5"  (1.651 m)   Wt 192 lb (87.1 kg)   LMP 07/10/2012   SpO2 98%   BMI 31.95 kg/m  Body mass index is 31.95 kg/m.  Wt Readings from Last 3 Encounters:  06/18/19 192 lb (87.1 kg)  03/16/19 189 lb  (85.7 kg)  11/13/18 185 lb (83.9 kg)   Constitutional: overweight, in NAD Eyes: PERRLA, EOMI, no exophthalmos ENT: moist mucous membranes, no thyromegaly, no cervical lymphadenopathy Cardiovascular: RRR, No MRG Respiratory: CTA B Gastrointestinal: abdomen soft, NT, ND, BS+ Musculoskeletal: no deformities, strength intact in all 4 Skin: moist, warm, no rashes Neurological: no tremor with outstretched hands, DTR normal in all 4  ASSESSMENT: 1. DM2, insulin-dependent, uncontrolled, without long-term complications, but with hyperglycemia  2. Hypothyroidism - postablative for Graves ds.  3. HL  PLAN:  1.  Patient with uncontrolled type 2 diabetes, on basal-bolus insulin regimen and also oral diabetic regimen with metformin and Farxiga.  In the past, she stopped SGLT 2 inhibitor due to negative Trulicity, but at last visit, sugars were very high so we restarted Tarceva.  We also increased her insulin and splinted into 2 doses.  We discussed at length about the importance of stopping sodas and sweet tea and stopping chips and fast food.  At last visit we discussed about starting a GLP-1 receptor agonist but she was complaining of left upper quadrant pain, which was under investigation so weight did not start this -At this visit, she tells me that she is still drinking sweet drinks and eating chips and other snacks.  Sugars continue to stay high, however, HbA1c: 10.3% (lower) -Reviewing her CGM downloads (we will scan these), it appears that her sugars are quite variable between 5 AM and approximately 4 PM.  She is not scanning her sensor later in the day and I advised her to not to forget to scan every 8 hours so we do not have gaps in the data. -In the last few days, sugars have been excellent overnight and she woke up with CBGs at goal.  However, the sugars do increase after breakfast.  Upon questioning, she did not split the Lantus  into 2 doses as advised at last visit.  We will do so now.  I  advised her to take 30 units in a.m. and 40 units at night.  We will not make any other changes for now, but I again stressed the importance of improving her diet at least by cutting out the sweet drinks. - I suggested to:  Patient Instructions  Please continue: - Novolog 20-25 units before every meal, including lunch - Metformin 1000 mg 2x a day with meals - Farxiga 10 mg before b'fast  Please split: - Lantus 30 units in am and 40 units at night  STOP SODAS and SWEET TEA! STOP CHIPS!  Please return in 3 months with your sugar log.   - advised to check sugars at different times of the day - 4x a day with her CGM - advised for yearly eye exams >> she is UTD - + UTD with flu shot - return to clinic in 3 months      2. Hypothyroidism - latest thyroid labs reviewed with pt >> normal 06/2018 - she continues on LT4 100 mcg daily - pt feels good on this dose. - we discussed about taking the thyroid hormone every day, with water, >30 minutes before breakfast, separated by >4 hours from acid reflux medications, calcium, iron, multivitamins. Pt. is taking it correctly. - will check thyroid tests today: TSH and fT4 - If labs are abnormal, she will need to return for repeat TFTs in 1.5 months  3. HL  - Reviewed latest lipid panel from 01/2019: LDL above target, as were her triglycerides Lab Results  Component Value Date   CHOL 130 12/25/2017   HDL 35.90 (L) 12/25/2017   LDLCALC 77 12/25/2017   TRIG 88.0 12/25/2017   CHOLHDL 4 12/25/2017  - Continues Crestor without side effects. -At last visit, we discussed about cutting down on fast food and sweet drinks  Office Visit on 06/18/2019  Component Date Value Ref Range Status  . TSH 06/18/2019 1.95  0.35 - 4.50 uIU/mL Final  . Free T4 06/18/2019 0.76  0.60 - 1.60 ng/dL Final   Comment: Specimens from patients who are undergoing biotin therapy and /or ingesting biotin supplements may contain high levels of biotin.  The higher biotin  concentration in these specimens interferes with this Free T4 assay.  Specimens that contain high levels  of biotin may cause false high results for this Free T4 assay.  Please interpret results in light of the total clinical presentation of the patient.    . Hemoglobin A1C 06/18/2019 10.3* 4.0 - 5.6 % Final   Excellent TFTs.  Philemon Kingdom, MD PhD [facility] Endocrinology

## 2019-06-19 ENCOUNTER — Other Ambulatory Visit: Payer: Self-pay | Admitting: Internal Medicine

## 2019-07-08 ENCOUNTER — Other Ambulatory Visit: Payer: Self-pay | Admitting: Internal Medicine

## 2019-07-27 ENCOUNTER — Other Ambulatory Visit: Payer: Self-pay

## 2019-07-27 ENCOUNTER — Ambulatory Visit
Admission: RE | Admit: 2019-07-27 | Discharge: 2019-07-27 | Disposition: A | Discharge status: Home / Self Care | Payer: BC Managed Care – PPO | Source: Ambulatory Visit | Attending: Family Medicine | Admitting: Family Medicine

## 2019-07-27 DIAGNOSIS — Z1231 Encounter for screening mammogram for malignant neoplasm of breast: Secondary | ICD-10-CM

## 2019-08-29 ENCOUNTER — Other Ambulatory Visit: Payer: Self-pay | Admitting: Internal Medicine

## 2019-09-10 ENCOUNTER — Other Ambulatory Visit: Payer: Self-pay | Admitting: Internal Medicine

## 2019-09-11 ENCOUNTER — Other Ambulatory Visit: Payer: Self-pay | Admitting: Internal Medicine

## 2019-10-01 ENCOUNTER — Other Ambulatory Visit: Payer: Self-pay | Admitting: Physician Assistant

## 2019-10-01 DIAGNOSIS — R1012 Left upper quadrant pain: Secondary | ICD-10-CM

## 2019-10-01 DIAGNOSIS — R110 Nausea: Secondary | ICD-10-CM

## 2019-10-07 ENCOUNTER — Ambulatory Visit
Admission: RE | Admit: 2019-10-07 | Discharge: 2019-10-07 | Disposition: A | Discharge status: Home / Self Care | Payer: BC Managed Care – PPO | Source: Ambulatory Visit | Attending: Physician Assistant | Admitting: Physician Assistant

## 2019-10-07 DIAGNOSIS — R110 Nausea: Secondary | ICD-10-CM

## 2019-10-07 DIAGNOSIS — R1012 Left upper quadrant pain: Secondary | ICD-10-CM

## 2019-10-21 ENCOUNTER — Ambulatory Visit: Payer: BC Managed Care – PPO | Admitting: Internal Medicine

## 2019-10-31 ENCOUNTER — Other Ambulatory Visit: Payer: Self-pay | Admitting: Internal Medicine

## 2019-12-07 ENCOUNTER — Other Ambulatory Visit: Payer: Self-pay | Admitting: Internal Medicine

## 2019-12-22 ENCOUNTER — Other Ambulatory Visit (HOSPITAL_COMMUNITY)
Admission: RE | Admit: 2019-12-22 | Discharge: 2019-12-22 | Disposition: A | Discharge status: Home / Self Care | Payer: 59 | Source: Ambulatory Visit | Attending: Family Medicine | Admitting: Family Medicine

## 2019-12-22 DIAGNOSIS — Z124 Encounter for screening for malignant neoplasm of cervix: Secondary | ICD-10-CM | POA: Insufficient documentation

## 2019-12-24 ENCOUNTER — Ambulatory Visit: Payer: 59 | Admitting: Internal Medicine

## 2019-12-24 ENCOUNTER — Encounter: Payer: Self-pay | Admitting: Internal Medicine

## 2019-12-24 ENCOUNTER — Other Ambulatory Visit: Payer: Self-pay | Admitting: Internal Medicine

## 2019-12-24 ENCOUNTER — Other Ambulatory Visit: Payer: Self-pay

## 2019-12-24 VITALS — BP 120/70 | HR 78 | Ht 65.0 in | Wt 195.0 lb

## 2019-12-24 DIAGNOSIS — E890 Postprocedural hypothyroidism: Secondary | ICD-10-CM

## 2019-12-24 DIAGNOSIS — E782 Mixed hyperlipidemia: Secondary | ICD-10-CM

## 2019-12-24 DIAGNOSIS — E1165 Type 2 diabetes mellitus with hyperglycemia: Secondary | ICD-10-CM | POA: Diagnosis not present

## 2019-12-24 DIAGNOSIS — Z794 Long term (current) use of insulin: Secondary | ICD-10-CM | POA: Diagnosis not present

## 2019-12-24 LAB — CYTOLOGY - PAP
Comment: NEGATIVE
Diagnosis: NEGATIVE
High risk HPV: NEGATIVE

## 2019-12-24 LAB — POCT GLYCOSYLATED HEMOGLOBIN (HGB A1C): Hemoglobin A1C: 12.5 % — AB (ref 4.0–5.6)

## 2019-12-24 MED ORDER — METFORMIN HCL ER 500 MG PO TB24: 1000.0000 mg | ORAL_TABLET | Freq: Every day | ORAL | 3 refills | Status: DC

## 2019-12-24 MED ORDER — NOVOLOG FLEXPEN 100 UNIT/ML ~~LOC~~ SOPN: PEN_INJECTOR | SUBCUTANEOUS | 3 refills | Status: DC

## 2019-12-24 MED ORDER — FARXIGA 10 MG PO TABS: 10.0000 mg | ORAL_TABLET | Freq: Every day | ORAL | 3 refills | Status: DC

## 2019-12-24 MED ORDER — LANTUS SOLOSTAR 100 UNIT/ML ~~LOC~~ SOPN: PEN_INJECTOR | SUBCUTANEOUS | 3 refills | Status: DC

## 2019-12-24 MED ORDER — LEVOTHYROXINE SODIUM 100 MCG PO TABS: 100.0000 ug | ORAL_TABLET | Freq: Every day | ORAL | 3 refills | Status: DC

## 2019-12-24 MED ORDER — FREESTYLE LIBRE 14 DAY SENSOR MISC: 3 refills | Status: DC

## 2019-12-24 NOTE — Addendum Note (Signed)
Addended by: Cardell Peach I on: 12/24/2019 08:26 AM   Modules accepted: Orders

## 2019-12-24 NOTE — Patient Instructions (Addendum)
Please restart: - Metformin 1000 mg 2x a day with meals - Farxiga 10 mg before b'fast - Novolog 20-25 units before every meal, but 10-15 units before a snack - Lantus 30 units in am and 40 units at night  STOP SODAS and SWEET TEA! STOP SNACKS!  Please return in 3 months with your sugar log.

## 2019-12-24 NOTE — Progress Notes (Signed)
Patient ID: Samantha [name], female   DOB: 03-Jun-1966, 54 y.o.   MRN: [id number]  This visit occurred during the SARS-CoV-2 public health emergency.  Safety protocols were in place, including screening questions prior to the visit, additional usage of staff PPE, and extensive cleaning of exam room while observing appropriate contact time as indicated for disinfecting solutions.   HPI: Samantha [name] is a 54 y.o.-year-old female, returning for follow-up for DM2, dx 2012, insulin-dependent since 2015, uncontrolled, without long term complications, but with medication noncompliance. Last OV 6 months ago  Before last visit, she stopped Metformin and Invokana due to negative publicity.  Her sugars increase significantly so we restarted Metformin ER and also started [place].  At this visit, she tells me she is not really taking her medicines... She does take Lantus, but not consistently.  She also sleeps during the day and stays up all night, eating snacks.  Reviewed HbA1c levels: Lab Results  Component Value Date   HGBA1C 10.3 (A) 06/18/2019   HGBA1C 12.1 (A) 03/16/2019   HGBA1C 10.0 (A) 11/13/2018  05/27/2014: 12% 02/2014: 9%  At last visit she was on: - >> stopped >> restarted Metformin ER 1000 mg twice a day  ->> stopped >> started Farxiga 10 mg daily in a.m. - Novolog 10-20 units before every meal, including lunch >> 20-20-20 units >> 20-25 units before every meal, including lunch - Lantus 60 >> 70 units at bedtime  (forgot to split it) She was on Farxiga x 2 years in the past, but this was stopped b/c weight loss (205 >> 179 lbs), urinary frequency She tried regular metformin >> GI sxs.  I advised her to use the following regimen, however, she is really not taking the medications except occasional Lantus: - Novolog 20-25 units before every meal, including lunch - Metformin 1000 mg 2x a day with meals - Farxiga 10 mg before b'fast - Lantus 30 units in am and 40 units at  night  She has a freestyle libre CGM and checks her sugars more than 4 times a day:  Freestyle libre CGM parameters: - Average: 293 - % active CGM time: 47% of the time - Glucose variability 35.1% (target < or = to 36%) - time in range:  - very low (<54): 0% - low (54-69): 0% - normal range (70-180): 10% - high sugars (181-250): 33% - very high sugars (>250): 57%    Previously:   Previously:   Lowest sugar was 40 (libre) - am >> 70 >> 89;  It is unclear at which level she has hypoglycemia awareness. Highest sugar was 270 >> 400 >> 300s >> 500.  Pt's meals are: - Breakfast: cereal or bisquit - Lunch: hamburger, chinese - Dinner: chicken  - Snacks: 2-3: Frosted flakes! Fruit punch!  We discussed about stopping sodas and other sweet drinks repeatedly in the past.    She had labs with PCP 2 days ago.  I will try to obtain these.  -No CKD, last BUN/creatinine: 01/29/2019: 14/0.94 Lab Results  Component Value Date   BUN 16 06/10/2018   BUN 19 12/25/2017   CREATININE 0.99 06/10/2018   CREATININE 0.80 12/25/2017  02/10/2015: 13/0.83, GFR 86 11/16/2013: 20/0.92, GFR 79  -+ HL; lipid panel: 01/29/2019: 226/265/48/125 Lab Results  Component Value Date   CHOL 130 12/25/2017   HDL 35.90 (L) 12/25/2017   LDLCALC 77 12/25/2017   TRIG 88.0 12/25/2017   CHOLHDL 4 12/25/2017  02/10/2015: 185/168/44/107  On Crestor.  - last  eye exam was in 08/2019: No DR reportedly- [facility].  -No numbness and tingling in her feet.  Hypothyroidism after RAI treatment for Graves' disease -History of medication noncompliance  Pt is on levothyroxine 125 mcg daily, taken: - in am - fasting - at least 30 min from b'fast - no Ca, Fe, MVI, PPIs - not on Biotin  Review TFTs: Lab Results  Component Value Date   TSH 1.95 06/18/2019   TSH 1.028 06/10/2018   TSH 3.56 03/27/2018   TSH 5.58 (H) 12/25/2017   TSH 4.47 02/27/2016   FREET4 0.76 06/18/2019   FREET4 0.83 03/27/2018    FREET4 0.84 12/25/2017   FREET4 0.61 02/27/2016   FREET4 0.71 05/19/2015  07/28/2013: TSH 1.69  Pt denies: - feeling nodules in neck - hoarseness - dysphagia - choking - SOB with lying down  ROS Constitutional: no weight gain/no weight loss, no fatigue, no subjective hyperthermia, no subjective hypothermia Eyes: no blurry vision, no xerophthalmia ENT: no sore throat, + see HPI Cardiovascular: no CP/no SOB/no palpitations/no leg swelling Respiratory: no cough/no SOB/no wheezing Gastrointestinal: no N/no V/no D/no C/no acid reflux Musculoskeletal: no muscle aches/no joint aches Skin: no rashes, no hair loss Neurological: no tremors/no numbness/no tingling/no dizziness  I reviewed pt's medications, allergies, PMH, social hx, family hx, and changes were documented in the history of present illness. Otherwise, unchanged from my initial visit note.  Past Medical History:  Diagnosis Date  . Diabetes mellitus without complication (HCC)    Type 2  . Urticaria    Past Surgical History:  Procedure Laterality Date  . CESAREAN SECTION    . UTERINE FIBROID EMBOLIZATION     Social History   Socioeconomic History  . Marital status: Single    Spouse name: Not on file  . Number of children: Not on file  . Years of education: Not on file  . Highest education level: Not on file  Occupational History  . Not on file  Tobacco Use  . Smoking status: Never Smoker  . Smokeless tobacco: Never Used  Substance and Sexual Activity  . Alcohol use: No    Alcohol/week: 0.0 standard drinks  . Drug use: Never  . Sexual activity: Not on file  Other Topics Concern  . Not on file  Social History Narrative  . Not on file   Social Determinants of Health   Financial Resource Strain:   . Difficulty of Paying Living Expenses:   Food Insecurity:   . Worried About Charity fundraiser in the Last Year:   . Arboriculturist in the Last Year:   Transportation Needs:   . Film/video editor  (Medical):   Marland Kitchen Lack of Transportation (Non-Medical):   Physical Activity:   . Days of Exercise per Week:   . Minutes of Exercise per Session:   Stress:   . Feeling of Stress :   Social Connections:   . Frequency of Communication with Friends and Family:   . Frequency of Social Gatherings with Friends and Family:   . Attends Religious Services:   . Active Member of Clubs or Organizations:   . Attends Archivist Meetings:   Marland Kitchen Marital Status:   Intimate Partner Violence:   . Fear of Current or Ex-Partner:   . Emotionally Abused:   Marland Kitchen Physically Abused:   . Sexually Abused:    Current Outpatient Medications on File Prior to Visit  Medication Sig Dispense Refill  . BD PEN NEEDLE NANO  U/F 32G X 4 MM MISC USE TO INJECT INSULIN FOUR TIMES DAILY. 400 each 11  . cetirizine (ZYRTEC) 10 MG tablet Take 1 tablet twice daily as needed 60 tablet 5  . Continuous Blood Gluc Sensor (FREESTYLE LIBRE 14 DAY SENSOR) MISC USE TO CHECK BLOOD SUGAR EVERY 14 DAYS, CHANGE EVERY 2 WEEKS 2 each 6  . dapagliflozin propanediol (FARXIGA) 10 MG TABS tablet Take 10 mg by mouth daily. 30 tablet 5  . EUTHYROX 100 MCG tablet Take 1 tablet by mouth once daily 90 tablet 0  . famotidine (PEPCID) 20 MG tablet Take 1 tablet (20 mg total) by mouth 2 (two) times daily. 60 tablet 5  . ibuprofen (ADVIL,MOTRIN) 800 MG tablet ibuprofen 800 mg tablet    . LANTUS SOLOSTAR 100 UNIT/ML Solostar Pen INJECT 60 UNITS SUBCUTANEOUSLY AT BEDTIME 30 mL 2  . metFORMIN (GLUCOPHAGE XR) 500 MG 24 hr tablet Take 2 tablets (1,000 mg total) by mouth daily with breakfast. 180 tablet 1  . NOVOLOG FLEXPEN 100 UNIT/ML FlexPen INJECT 15 TO 20 UNITS SUBCUTANEOUSLY THREE TIMES DAILY WITH MEALS 15 mL 0  . omeprazole (PRILOSEC) 40 MG capsule omeprazole 40 mg capsule,delayed release    . rosuvastatin (CRESTOR) 5 MG tablet Take 1 tablet (5 mg total) by mouth at bedtime. (Patient taking differently: Take 5 mg by mouth daily. ) 90 tablet 0  . VITAMIN  D, ERGOCALCIFEROL, PO Take 1 capsule by mouth daily.     No current facility-administered medications on file prior to visit.   Allergies  Allergen Reactions  . Bupropion Hives  . Lexapro [Escitalopram] Diarrhea and Nausea Only  . Augmentin [Amoxicillin-Pot Clavulanate] Itching  . Cyclobenzaprine Other (See Comments)    Excessive sleepiness  . Pravastatin Other (See Comments)    Leg cramps/ mylagia  . Atorvastatin Other (See Comments)    Leg cramps  . Doxepin Hcl Other (See Comments)    Nightmares    Family History  Problem Relation Age of Onset  . Diabetes Other   . Stroke Other     PE: BP 120/70   Pulse 78   Ht 5\' 5"  (1.651 m)   Wt 195 lb (88.5 kg)   LMP 07/10/2012   SpO2 99%   BMI 32.45 kg/m  Body mass index is 32.45 kg/m.  Wt Readings from Last 3 Encounters:  12/24/19 195 lb (88.5 kg)  06/18/19 192 lb (87.1 kg)  03/16/19 189 lb (85.7 kg)   Constitutional: overweight, in NAD Eyes: PERRLA, EOMI, no exophthalmos ENT: moist mucous membranes, no thyromegaly, no cervical lymphadenopathy Cardiovascular: RRR, No MRG Respiratory: CTA B Gastrointestinal: abdomen soft, NT, ND, BS+ Musculoskeletal: no deformities, strength intact in all 4 Skin: moist, warm, no rashes Neurological: no tremor with outstretched hands, DTR normal in all 4  ASSESSMENT: 1. DM2, insulin-dependent, uncontrolled, without long-term complications, but with hyperglycemia  2. Hypothyroidism - postablative for Graves ds.  3. HL  PLAN:  1.  Patient with uncontrolled type 2 diabetes, on basal-bolus insulin regimen, and also oral medications: Metformin and SGLT2 inhibitor.  At last visit, she was not compliant with her regimen and stopped Metformin and Farxiga and just restarted them right before the visit.  We discussed about the importance of taking this consistently and also stopping sweet drinks and eating processed snacks.  At that time, HbA1c was 10.3%.  We discussed about splitting Lantus  into 2 doses. -At this visit, sugars are very high at all times of the day, as she is sleeping  during the day, eating dinner (first meal of her day) around 8 PM and then stays up all night eating chips, cookies, drinking sweet drinks.  In this case, I advised her that we cannot control her diabetes and if she continues to do so and not take her medicines, I cannot help her.  Advised her to return to see me only if she is able to change her schedule and her compliance level. - For now, we will not change her regimen but I refilled all of her medications and strongly advised her to restart taking them.  I did advise her to take NovoLog 10 to 15 units before a snack at night, but ideally, she would not have snacks only meals. - I suggested to:  Patient Instructions  Please restart: - Metformin 1000 mg 2x a day with meals - Farxiga 10 mg before b'fast - Novolog 20-25 units before every meal, but 10-15 units before a snack - Lantus 30 units in am and 40 units at night  STOP SODAS and SWEET TEA! STOP SNACKS!  Please return in 3 months with your sugar log.   - we checked her HbA1c: 12.5% (Higher) - advised to check sugars at different times of the day - 4x a day, rotating check times - advised for yearly eye exams >> she is UTD - return to clinic in 3 months    2. Hypothyroidism - latest thyroid labs reviewed with pt >> normal: Lab Results  Component Value Date   TSH 1.95 06/18/2019   - she continues on LT4 100 mcg daily - pt feels good on this dose. - we discussed about taking the thyroid hormone every day, with water, >30 minutes before breakfast, separated by >4 hours from acid reflux medications, calcium, iron, multivitamins. Pt. is taking it correctly.  3. HL  -Reviewed latest lipid panel from 01/2019: LDL above goal, as were her triglycerides.  I would like to obtain her most recent lipid panel. -Again discussed about improving diet, cutting out sweet drinks and processed  snacks -Continues Crestor without side effects  Philemon Kingdom, MD PhD [facility]

## 2020-01-13 ENCOUNTER — Other Ambulatory Visit: Payer: Self-pay | Admitting: Internal Medicine

## 2020-01-13 ENCOUNTER — Telehealth: Payer: Self-pay

## 2020-01-13 MED ORDER — FARXIGA 10 MG PO TABS: 10.0000 mg | ORAL_TABLET | Freq: Every day | ORAL | 3 refills | Status: DC

## 2020-01-13 NOTE — Telephone Encounter (Signed)
Prior authorization for [facility] has been approved by patient's insurance.  Coverage is effective 01/12/2020 to 01/11/2021   Approval letter has been sent to scanning.

## 2020-01-14 LAB — TSH: TSH: 0.38 — AB (ref 0.41–5.90)

## 2020-01-18 ENCOUNTER — Other Ambulatory Visit: Payer: Self-pay

## 2020-01-18 ENCOUNTER — Ambulatory Visit
Admission: RE | Admit: 2020-01-18 | Discharge: 2020-01-18 | Disposition: A | Discharge status: Home / Self Care | Payer: 59 | Source: Ambulatory Visit | Attending: Family Medicine | Admitting: Family Medicine

## 2020-01-18 ENCOUNTER — Other Ambulatory Visit: Payer: Self-pay | Admitting: Family Medicine

## 2020-01-18 DIAGNOSIS — R002 Palpitations: Secondary | ICD-10-CM

## 2020-01-20 ENCOUNTER — Encounter: Payer: Self-pay | Admitting: Internal Medicine

## 2020-01-21 ENCOUNTER — Ambulatory Visit: Payer: 59 | Admitting: Cardiology

## 2020-01-21 NOTE — Progress Notes (Deleted)
Cardiology Office Note:    Date:  01/21/2020   ID:  Samantha [name], DOB July 19, 1966, MRN [id number]  PCP:  Harlan Stains, MD  Cardiologist:  No primary care provider on file.  Electrophysiologist:  None   Referring MD: Lois Huxley, PA   No chief complaint on file. ***  History of Present Illness:    Samantha [name] is a 54 y.o. female with a hx of type 2 diabetes, hypothyroidism, hyperlipidemia who was referred by Marilynne Drivers, PA for evaluation of chest pain.  Past Medical History:  Diagnosis Date  . Diabetes mellitus without complication (HCC)    Type 2  . Urticaria     Past Surgical History:  Procedure Laterality Date  . CESAREAN SECTION    . UTERINE FIBROID EMBOLIZATION      Current Medications: No outpatient medications have been marked as taking for the 01/21/20 encounter (Appointment) with Donato Heinz, MD.     Allergies:   Bupropion, Lexapro [escitalopram], Augmentin [amoxicillin-pot clavulanate], Cyclobenzaprine, Pravastatin, Atorvastatin, and Doxepin hcl   Social History   Socioeconomic History  . Marital status: Single    Spouse name: Not on file  . Number of children: Not on file  . Years of education: Not on file  . Highest education level: Not on file  Occupational History  . Not on file  Tobacco Use  . Smoking status: Never Smoker  . Smokeless tobacco: Never Used  Substance and Sexual Activity  . Alcohol use: No    Alcohol/week: 0.0 standard drinks  . Drug use: Never  . Sexual activity: Not on file  Other Topics Concern  . Not on file  Social History Narrative  . Not on file   Social Determinants of Health   Financial Resource Strain:   . Difficulty of Paying Living Expenses:   Food Insecurity:   . Worried About Charity fundraiser in the Last Year:   . Arboriculturist in the Last Year:   Transportation Needs:   . Film/video editor (Medical):   Marland Kitchen Lack of Transportation (Non-Medical):   Physical Activity:     . Days of Exercise per Week:   . Minutes of Exercise per Session:   Stress:   . Feeling of Stress :   Social Connections:   . Frequency of Communication with Friends and Family:   . Frequency of Social Gatherings with Friends and Family:   . Attends Religious Services:   . Active Member of Clubs or Organizations:   . Attends Archivist Meetings:   Marland Kitchen Marital Status:      Family History: The patient's ***family history includes Diabetes in an other family member; Stroke in an other family member.  ROS:   Please see the history of present illness.    *** All other systems reviewed and are negative.  EKGs/Labs/Other Studies Reviewed:    The following studies were reviewed today: ***  EKG:  EKG is *** ordered today.  The ekg ordered today demonstrates ***  Recent Labs: 01/13/2020: TSH 0.38  Recent Lipid Panel    Component Value Date/Time   CHOL 130 12/25/2017 1044   TRIG 88.0 12/25/2017 1044   HDL 35.90 (L) 12/25/2017 1044   CHOLHDL 4 12/25/2017 1044   VLDL 17.6 12/25/2017 1044   LDLCALC 77 12/25/2017 1044    Physical Exam:    VS:  LMP 07/10/2012     Wt Readings from Last 3 Encounters:  12/24/19 195 lb (88.5  kg)  06/18/19 192 lb (87.1 kg)  03/16/19 189 lb (85.7 kg)     GEN: *** Well nourished, well developed in no acute distress HEENT: Normal NECK: No JVD; No carotid bruits LYMPHATICS: No lymphadenopathy CARDIAC: ***RRR, no murmurs, rubs, gallops RESPIRATORY:  Clear to auscultation without rales, wheezing or rhonchi  ABDOMEN: Soft, non-tender, non-distended MUSCULOSKELETAL:  No edema; No deformity  SKIN: Warm and dry NEUROLOGIC:  Alert and oriented x 3 PSYCHIATRIC:  Normal affect   ASSESSMENT:    No diagnosis found. PLAN:    In order of problems listed above:  1. ***   Medication Adjustments/Labs and Tests Ordered: Current medicines are reviewed at length with the patient today.  Concerns regarding medicines are outlined above.  No orders  of the defined types were placed in this encounter.  No orders of the defined types were placed in this encounter.   There are no Patient Instructions on file for this visit.   Signed, Donato Heinz, MD  01/21/2020 12:35 AM    [facility]

## 2020-01-24 ENCOUNTER — Emergency Department (HOSPITAL_COMMUNITY): Payer: 59

## 2020-01-24 ENCOUNTER — Inpatient Hospital Stay (HOSPITAL_COMMUNITY)
Admission: EM | Admit: 2020-01-24 | Discharge: 2020-01-28 | DRG: 193 | Disposition: A | Discharge status: Home / Self Care | Payer: 59 | Attending: Family Medicine | Admitting: Family Medicine

## 2020-01-24 ENCOUNTER — Encounter (HOSPITAL_COMMUNITY): Payer: Self-pay

## 2020-01-24 ENCOUNTER — Inpatient Hospital Stay (HOSPITAL_COMMUNITY): Payer: 59

## 2020-01-24 ENCOUNTER — Other Ambulatory Visit: Payer: Self-pay

## 2020-01-24 DIAGNOSIS — Z823 Family history of stroke: Secondary | ICD-10-CM

## 2020-01-24 DIAGNOSIS — Z20822 Contact with and (suspected) exposure to covid-19: Secondary | ICD-10-CM | POA: Diagnosis present

## 2020-01-24 DIAGNOSIS — J189 Pneumonia, unspecified organism: Secondary | ICD-10-CM | POA: Diagnosis not present

## 2020-01-24 DIAGNOSIS — E875 Hyperkalemia: Secondary | ICD-10-CM | POA: Diagnosis not present

## 2020-01-24 DIAGNOSIS — H5713 Ocular pain, bilateral: Secondary | ICD-10-CM | POA: Diagnosis present

## 2020-01-24 DIAGNOSIS — Z794 Long term (current) use of insulin: Secondary | ICD-10-CM | POA: Diagnosis not present

## 2020-01-24 DIAGNOSIS — Z791 Long term (current) use of non-steroidal anti-inflammatories (NSAID): Secondary | ICD-10-CM | POA: Diagnosis not present

## 2020-01-24 DIAGNOSIS — H5711 Ocular pain, right eye: Secondary | ICD-10-CM

## 2020-01-24 DIAGNOSIS — Z79890 Hormone replacement therapy (postmenopausal): Secondary | ICD-10-CM | POA: Diagnosis not present

## 2020-01-24 DIAGNOSIS — Z833 Family history of diabetes mellitus: Secondary | ICD-10-CM

## 2020-01-24 DIAGNOSIS — R0902 Hypoxemia: Secondary | ICD-10-CM | POA: Diagnosis present

## 2020-01-24 DIAGNOSIS — E785 Hyperlipidemia, unspecified: Secondary | ICD-10-CM | POA: Diagnosis present

## 2020-01-24 DIAGNOSIS — E1165 Type 2 diabetes mellitus with hyperglycemia: Secondary | ICD-10-CM | POA: Diagnosis present

## 2020-01-24 DIAGNOSIS — E890 Postprocedural hypothyroidism: Secondary | ICD-10-CM | POA: Diagnosis present

## 2020-01-24 DIAGNOSIS — S0502XA Injury of conjunctiva and corneal abrasion without foreign body, left eye, initial encounter: Secondary | ICD-10-CM | POA: Diagnosis present

## 2020-01-24 DIAGNOSIS — E11311 Type 2 diabetes mellitus with unspecified diabetic retinopathy with macular edema: Secondary | ICD-10-CM

## 2020-01-24 DIAGNOSIS — J9601 Acute respiratory failure with hypoxia: Secondary | ICD-10-CM | POA: Diagnosis not present

## 2020-01-24 DIAGNOSIS — Z79899 Other long term (current) drug therapy: Secondary | ICD-10-CM

## 2020-01-24 HISTORY — DX: Disorder of thyroid, unspecified: E07.9

## 2020-01-24 LAB — D-DIMER, QUANTITATIVE: D-Dimer, Quant: 0.69 ug/mL-FEU — ABNORMAL HIGH (ref 0.00–0.50)

## 2020-01-24 LAB — LACTIC ACID, PLASMA
Lactic Acid, Venous: 1.2 mmol/L (ref 0.5–1.9)
Lactic Acid, Venous: 1.1 mmol/L (ref 0.5–1.9)

## 2020-01-24 LAB — CBC
HCT: 42.2 % (ref 36.0–46.0)
Hemoglobin: 13.4 g/dL (ref 12.0–15.0)
MCH: 27.1 pg (ref 26.0–34.0)
MCHC: 31.8 g/dL (ref 30.0–36.0)
MCV: 85.4 fL (ref 80.0–100.0)
Platelets: 595 10*3/uL — ABNORMAL HIGH (ref 150–400)
RBC: 4.94 MIL/uL (ref 3.87–5.11)
RDW: 12.6 % (ref 11.5–15.5)
WBC: 7.2 10*3/uL (ref 4.0–10.5)
nRBC: 0 % (ref 0.0–0.2)

## 2020-01-24 LAB — COMPREHENSIVE METABOLIC PANEL
ALT: 20 U/L (ref 0–44)
AST: 15 U/L (ref 15–41)
Albumin: 3.2 g/dL — ABNORMAL LOW (ref 3.5–5.0)
Alkaline Phosphatase: 67 U/L (ref 38–126)
Anion gap: 15 (ref 5–15)
BUN: 23 mg/dL — ABNORMAL HIGH (ref 6–20)
CO2: 23 mmol/L (ref 22–32)
Calcium: 9 mg/dL (ref 8.9–10.3)
Chloride: 100 mmol/L (ref 98–111)
Creatinine, Ser: 0.99 mg/dL (ref 0.44–1.00)
GFR calc Af Amer: >60 mL/min (ref >60)
GFR calc non Af Amer: >60 mL/min (ref >60)
Glucose, Bld: 307 mg/dL — ABNORMAL HIGH (ref 70–99)
Potassium: 4 mmol/L (ref 3.5–5.1)
Sodium: 138 mmol/L (ref 135–145)
Total Bilirubin: 0.4 mg/dL (ref 0.3–1.2)
Total Protein: 7.4 g/dL (ref 6.5–8.1)

## 2020-01-24 LAB — BASIC METABOLIC PANEL
Anion gap: 15 (ref 5–15)
BUN: 24 mg/dL — ABNORMAL HIGH (ref 6–20)
CO2: 26 mmol/L (ref 22–32)
Calcium: 9.9 mg/dL (ref 8.9–10.3)
Chloride: 101 mmol/L (ref 98–111)
Creatinine, Ser: 1.06 mg/dL — ABNORMAL HIGH (ref 0.44–1.00)
GFR calc Af Amer: >60 mL/min (ref >60)
GFR calc non Af Amer: 60 mL/min — ABNORMAL LOW (ref >60)
Glucose, Bld: 331 mg/dL — ABNORMAL HIGH (ref 70–99)
Potassium: 4.2 mmol/L (ref 3.5–5.1)
Sodium: 142 mmol/L (ref 135–145)

## 2020-01-24 LAB — FERRITIN: Ferritin: 272 ng/mL (ref 11–307)

## 2020-01-24 LAB — SARS CORONAVIRUS 2 BY RT PCR (HOSPITAL ORDER, PERFORMED IN ~~LOC~~ HOSPITAL LAB): SARS Coronavirus 2: NEGATIVE

## 2020-01-24 LAB — POC SARS CORONAVIRUS 2 AG -  ED: SARS Coronavirus 2 Ag: NEGATIVE

## 2020-01-24 LAB — FIBRINOGEN: Fibrinogen: 748 mg/dL — ABNORMAL HIGH (ref 210–475)

## 2020-01-24 LAB — C-REACTIVE PROTEIN: CRP: 7.1 mg/dL — ABNORMAL HIGH (ref <1.0)

## 2020-01-24 LAB — TROPONIN I (HIGH SENSITIVITY)
Troponin I (High Sensitivity): 3 ng/L (ref <18)
Troponin I (High Sensitivity): 3 ng/L (ref <18)

## 2020-01-24 LAB — I-STAT BETA HCG BLOOD, ED (MC, WL, AP ONLY): I-stat hCG, quantitative: <5 m[IU]/mL (ref <5)

## 2020-01-24 LAB — PROCALCITONIN: Procalcitonin: 0.11 ng/mL

## 2020-01-24 LAB — LACTATE DEHYDROGENASE: LDH: 282 U/L — ABNORMAL HIGH (ref 98–192)

## 2020-01-24 LAB — TRIGLYCERIDES: Triglycerides: 248 mg/dL — ABNORMAL HIGH (ref <150)

## 2020-01-24 MED ORDER — PANTOPRAZOLE SODIUM 40 MG PO TBEC
40.0000 mg | DELAYED_RELEASE_TABLET | Freq: Every day | ORAL | Status: DC
  Administered 2020-01-25 – 2020-01-28 (×4): 40 mg via ORAL
  Filled 2020-01-24 (×4): qty 1

## 2020-01-24 MED ORDER — SERTRALINE HCL 100 MG PO TABS
100.0000 mg | ORAL_TABLET | Freq: Every day | ORAL | Status: DC
  Administered 2020-01-25 – 2020-01-28 (×4): 100 mg via ORAL
  Filled 2020-01-24 (×4): qty 1

## 2020-01-24 MED ORDER — IBUPROFEN 800 MG PO TABS
800.0000 mg | ORAL_TABLET | Freq: Three times a day (TID) | ORAL | Status: DC | PRN
  Administered 2020-01-26 – 2020-01-27 (×2): 800 mg via ORAL
  Filled 2020-01-24 (×2): qty 1

## 2020-01-24 MED ORDER — INSULIN GLARGINE 100 UNIT/ML ~~LOC~~ SOLN
40.0000 [IU] | Freq: Every day | SUBCUTANEOUS | Status: DC
  Administered 2020-01-25 (×2): 40 [IU] via SUBCUTANEOUS
  Filled 2020-01-24 (×2): qty 0.4

## 2020-01-24 MED ORDER — SODIUM CHLORIDE 0.9% FLUSH
3.0000 mL | Freq: Two times a day (BID) | INTRAVENOUS | Status: DC
  Administered 2020-01-24 – 2020-01-26 (×2): 3 mL via INTRAVENOUS

## 2020-01-24 MED ORDER — AEROCHAMBER PLUS FLO-VU MEDIUM MISC: 1.0000 | Freq: Once | Status: DC

## 2020-01-24 MED ORDER — ALBUTEROL SULFATE HFA 108 (90 BASE) MCG/ACT IN AERS: 2.0000 | INHALATION_SPRAY | Freq: Four times a day (QID) | RESPIRATORY_TRACT | Status: DC

## 2020-01-24 MED ORDER — INSULIN GLARGINE 100 UNIT/ML ~~LOC~~ SOLN
30.0000 [IU] | Freq: Every day | SUBCUTANEOUS | Status: DC
  Administered 2020-01-25: 30 [IU] via SUBCUTANEOUS
  Filled 2020-01-24: qty 0.3

## 2020-01-24 MED ORDER — SODIUM CHLORIDE 0.9% FLUSH: 3.0000 mL | INTRAVENOUS | Status: DC | PRN

## 2020-01-24 MED ORDER — SODIUM CHLORIDE 0.9 % IV SOLN
2.0000 g | INTRAVENOUS | Status: DC
  Administered 2020-01-25: 2 g via INTRAVENOUS
  Filled 2020-01-24: qty 2

## 2020-01-24 MED ORDER — SODIUM CHLORIDE 0.9 % IV SOLN
250.0000 mL | INTRAVENOUS | Status: DC | PRN
  Administered 2020-01-25: 250 mL via INTRAVENOUS

## 2020-01-24 MED ORDER — CANAGLIFLOZIN 100 MG PO TABS
100.0000 mg | ORAL_TABLET | Freq: Every day | ORAL | Status: DC
  Filled 2020-01-24: qty 1

## 2020-01-24 MED ORDER — SODIUM CHLORIDE 0.9% FLUSH: 3.0000 mL | Freq: Once | INTRAVENOUS | Status: DC

## 2020-01-24 MED ORDER — INSULIN GLARGINE 100 UNIT/ML SOLOSTAR PEN: 30.0000 [IU] | PEN_INJECTOR | Freq: Two times a day (BID) | SUBCUTANEOUS | Status: DC

## 2020-01-24 MED ORDER — DEXAMETHASONE SODIUM PHOSPHATE 10 MG/ML IJ SOLN
10.0000 mg | Freq: Once | INTRAMUSCULAR | Status: AC
  Administered 2020-01-24: 10 mg via INTRAVENOUS
  Filled 2020-01-24: qty 1

## 2020-01-24 MED ORDER — LEVOTHYROXINE SODIUM 100 MCG PO TABS
100.0000 ug | ORAL_TABLET | Freq: Every day | ORAL | Status: DC
  Administered 2020-01-25 – 2020-01-28 (×4): 100 ug via ORAL
  Filled 2020-01-24 (×4): qty 1

## 2020-01-24 MED ORDER — METFORMIN HCL ER 500 MG PO TB24
1000.0000 mg | ORAL_TABLET | Freq: Every day | ORAL | Status: DC
  Administered 2020-01-25 – 2020-01-27 (×3): 1000 mg via ORAL
  Filled 2020-01-24 (×3): qty 2

## 2020-01-24 MED ORDER — INSULIN ASPART 100 UNIT/ML ~~LOC~~ SOLN
0.0000 [IU] | Freq: Three times a day (TID) | SUBCUTANEOUS | Status: DC
  Filled 2020-01-24: qty 0.15

## 2020-01-24 MED ORDER — ENOXAPARIN SODIUM 40 MG/0.4ML ~~LOC~~ SOLN
40.0000 mg | Freq: Every day | SUBCUTANEOUS | Status: DC
  Administered 2020-01-25 – 2020-01-27 (×4): 40 mg via SUBCUTANEOUS
  Filled 2020-01-24 (×4): qty 0.4

## 2020-01-24 MED ORDER — INSULIN ASPART 100 UNIT/ML FLEXPEN: 10.0000 [IU] | PEN_INJECTOR | Freq: Three times a day (TID) | SUBCUTANEOUS | Status: DC

## 2020-01-24 MED ORDER — AZITHROMYCIN 250 MG PO TABS: 500.0000 mg | ORAL_TABLET | Freq: Every day | ORAL | Status: DC

## 2020-01-24 MED ORDER — AZITHROMYCIN 250 MG PO TABS
500.0000 mg | ORAL_TABLET | Freq: Every day | ORAL | Status: DC
  Administered 2020-01-25: 500 mg via ORAL
  Filled 2020-01-24: qty 2

## 2020-01-24 MED ORDER — LORATADINE 10 MG PO TABS
10.0000 mg | ORAL_TABLET | Freq: Every day | ORAL | Status: DC
  Administered 2020-01-26 – 2020-01-28 (×3): 10 mg via ORAL
  Filled 2020-01-24 (×4): qty 1

## 2020-01-24 MED ORDER — ROSUVASTATIN CALCIUM 5 MG PO TABS
5.0000 mg | ORAL_TABLET | Freq: Every day | ORAL | Status: DC
  Administered 2020-01-25 – 2020-01-28 (×4): 5 mg via ORAL
  Filled 2020-01-24 (×4): qty 1

## 2020-01-24 NOTE — ED Provider Notes (Signed)
[facility] DEPT Provider Note   CSN: [id number] Arrival date & time: 01/24/20  [id number]     History Chief Complaint  Patient presents with  . Shortness of Breath  . Eye Pain    Samantha [name] is a 54 y.o. female.  54yo female, went to [facility] on Saturday a week ago concerned she had COVID, had a negative test (reports feeling chills, headache, not feeling well). Patient had a CXR on Monday showing pneumonia, was given Doxy, Cefpodaxime. Patient thought she was feeling better but then became more SHOB, especially with walking. Patient also reports pain in both eyes, went to the doctor today thinking her contact lense was the problem and was found to have a corneal abrasion on the left side, tetracaine was placed in both eyes, unable to remove the right contact lens.         Past Medical History:  Diagnosis Date  . Diabetes mellitus without complication (HCC)    Type 2  . Thyroid disease   . Urticaria     Patient Active Problem List   Diagnosis Date Noted  . Mixed hyperlipidemia 11/13/2018  . Uncontrolled type 2 diabetes mellitus with hyperglycemia, with long-term current use of insulin ([place]) 06/10/2017  . Hypothyroidism, postablative 06/24/2014    Past Surgical History:  Procedure Laterality Date  . CESAREAN SECTION    . UTERINE FIBROID EMBOLIZATION       OB History   No obstetric history on file.     Family History  Problem Relation Age of Onset  . Diabetes Other   . Stroke Other     Social History   Tobacco Use  . Smoking status: Never Smoker  . Smokeless tobacco: Never Used  Substance Use Topics  . Alcohol use: No    Alcohol/week: 0.0 standard drinks  . Drug use: Never    Home Medications Prior to Admission medications   Medication Sig Start Date End Date Taking? Authorizing Provider  cefpodoxime (VANTIN) 200 MG tablet Take 200 mg by mouth 2 (two) times daily. 01/19/20  Yes [provider]  cetirizine  (ZYRTEC) 10 MG tablet Take 1 tablet twice daily as needed Patient taking differently: Take 10 mg by mouth daily as needed for allergies. Take 1 tablet twice daily as needed 08/01/18  Yes Padgett, Rae Halsted, MD  dapagliflozin propanediol (FARXIGA) 10 MG TABS tablet Take 10 mg by mouth daily. 01/13/20  Yes Philemon Kingdom, MD  doxycycline (VIBRA-TABS) 100 MG tablet Take 100 mg by mouth every 12 (twelve) hours. 01/19/20  Yes [provider]  ibuprofen (ADVIL,MOTRIN) 800 MG tablet Take 800 mg by mouth every 8 (eight) hours as needed for mild pain or moderate pain.    Yes [provider]  insulin glargine (LANTUS SOLOSTAR) 100 UNIT/ML Solostar Pen INJECT 40 UNITS SUBCUTANEOUSLY DAILY Patient taking differently: Inject 30-40 Units into the skin 2 (two) times daily. Inject 30 units subcutaneously every morning and 40 units subcutaneously every evening. 12/24/19  Yes Philemon Kingdom, MD  levothyroxine (EUTHYROX) 100 MCG tablet Take 1 tablet (100 mcg total) by mouth daily. 12/24/19  Yes Philemon Kingdom, MD  metFORMIN (GLUCOPHAGE XR) 500 MG 24 hr tablet Take 2 tablets (1,000 mg total) by mouth daily with breakfast. 12/24/19  Yes Philemon Kingdom, MD  NOVOLOG FLEXPEN 100 UNIT/ML FlexPen INJECT 10-25 UNITS SUBCUTANEOUSLY THREE TIMES DAILY WITH MEALS Patient taking differently: 10-25 Units 3 (three) times daily with meals.  12/24/19  Yes Philemon Kingdom, MD  omeprazole (  PRILOSEC) 40 MG capsule Take 40 mg by mouth daily.    Yes [provider]  rosuvastatin (CRESTOR) 5 MG tablet Take 1 tablet (5 mg total) by mouth at bedtime. Patient taking differently: Take 5 mg by mouth daily.  10/26/16  Yes Philemon Kingdom, MD  sertraline (ZOLOFT) 100 MG tablet Take 100 mg by mouth daily. 01/14/20  Yes [provider]  VITAMIN D, ERGOCALCIFEROL, PO Take 1 capsule by mouth daily.   Yes [provider]  BD PEN NEEDLE NANO U/F 32G X 4 MM MISC USE TO INJECT INSULIN FOUR TIMES  DAILY. 07/08/19   Philemon Kingdom, MD  Continuous Blood Gluc Sensor (FREESTYLE LIBRE 14 DAY SENSOR) MISC USE TO CHECK BLOOD SUGAR EVERY 14 DAYS, CHANGE EVERY 2 WEEKS 12/24/19   Philemon Kingdom, MD  famotidine (PEPCID) 20 MG tablet Take 1 tablet (20 mg total) by mouth 2 (two) times daily. Patient not taking: Reported on 01/24/2020 07/31/18   Kennith Gain, MD    Allergies    Bupropion, Lexapro [escitalopram], Augmentin [amoxicillin-pot clavulanate], Cyclobenzaprine, Pravastatin, Atorvastatin, and Doxepin hcl  Review of Systems   Review of Systems  Constitutional: Positive for appetite change and chills. Negative for fever.       No loss of smell/taste  HENT: Positive for rhinorrhea and sore throat. Negative for congestion.   Eyes: Positive for discharge and itching.       Watery, matted in the AM  Respiratory: Positive for cough and shortness of breath.        Productive- clear mucous  Cardiovascular: Negative for chest pain.  Gastrointestinal: Positive for diarrhea. Negative for abdominal pain, nausea and vomiting.  Genitourinary: Negative for dysuria and flank pain.  Musculoskeletal: Positive for arthralgias and myalgias.  Skin: Negative for rash and wound.  Allergic/Immunologic: Positive for immunocompromised state.  Neurological: Positive for weakness.  All other systems reviewed and are negative.   Physical Exam Updated Vital Signs BP 126/85   Pulse 84   Temp 98 F (36.7 C) (Oral)   Resp (!) 24   Ht 5\' 6"  (1.676 m)   Wt 83.5 kg   LMP 07/10/2012   SpO2 100%   BMI 29.70 kg/m   Physical Exam Vitals and nursing note reviewed.  Constitutional:      Appearance: She is well-developed. She is not diaphoretic.  HENT:     Head: Normocephalic and atraumatic.  Cardiovascular:     Rate and Rhythm: Normal rate and regular rhythm.  Pulmonary:     Effort: Tachypnea present.     Breath sounds: Examination of the right-lower field reveals rhonchi. Examination of  the left-lower field reveals rhonchi. Rhonchi present.  Chest:     Chest wall: No tenderness.  Abdominal:     Palpations: Abdomen is soft.     Tenderness: There is no abdominal tenderness.  Musculoskeletal:     Cervical back: Neck supple.     Right lower leg: No edema.     Left lower leg: No edema.  Skin:    General: Skin is warm and dry.     Findings: No erythema or rash.  Neurological:     Mental Status: She is alert and oriented to person, place, and time.  Psychiatric:        Behavior: Behavior normal.     ED Results / Procedures / Treatments   Labs (all labs ordered are listed, but only abnormal results are displayed) Labs Reviewed  BASIC METABOLIC PANEL - Abnormal; Notable for the  following components:      Result Value   Glucose, Bld 331 (*)    BUN 24 (*)    Creatinine, Ser 1.06 (*)    GFR calc non Af Amer 60 (*)    All other components within normal limits  CBC - Abnormal; Notable for the following components:   Platelets 595 (*)    All other components within normal limits  COMPREHENSIVE METABOLIC PANEL - Abnormal; Notable for the following components:   Glucose, Bld 307 (*)    BUN 23 (*)    Albumin 3.2 (*)    All other components within normal limits  D-DIMER, QUANTITATIVE (NOT AT [facility]) - Abnormal; Notable for the following components:   D-Dimer, Quant 0.69 (*)    All other components within normal limits  LACTATE DEHYDROGENASE - Abnormal; Notable for the following components:   LDH 282 (*)    All other components within normal limits  TRIGLYCERIDES - Abnormal; Notable for the following components:   Triglycerides 248 (*)    All other components within normal limits  FIBRINOGEN - Abnormal; Notable for the following components:   Fibrinogen 748 (*)    All other components within normal limits  C-REACTIVE PROTEIN - Abnormal; Notable for the following components:   CRP 7.1 (*)    All other components within normal limits  SARS CORONAVIRUS 2 BY RT PCR  (HOSPITAL ORDER, [place] [facility])  CULTURE, BLOOD (ROUTINE X 2)  CULTURE, BLOOD (ROUTINE X 2)  LACTIC ACID, PLASMA  LACTIC ACID, PLASMA  PROCALCITONIN  FERRITIN  POC SARS CORONAVIRUS 2 AG -  ED  I-STAT BETA HCG BLOOD, ED ([facility], [facility], [facility] ONLY)  TROPONIN I (HIGH SENSITIVITY)  TROPONIN I (HIGH SENSITIVITY)    EKG None  Radiology DG Chest 2 View  Result Date: 01/24/2020 CLINICAL DATA:  Patient c/o intermittent chest pain and SOB x 1 week. Patient states she was diagnosed with pneumonia 6 days ago. Patient also reports that she tested negative for Covid. EXAM: CHEST - 2 VIEW COMPARISON:  01/18/2020 FINDINGS: Interstitial and hazy airspace opacities are noted in the mid and lower lungs mildly increased the prior study, with the increase most evident in the mid lungs. No pleural effusion or pneumothorax. Cardiac silhouette is normal in size. No mediastinal or hilar masses. No evidence of adenopathy. Skeletal structures are intact. IMPRESSION: 1. Since the prior study, the infiltrates in the lungs mildly increased, now involving the mid lungs as well as the bases, consistent with worsened multifocal pneumonia. Electronically Signed   By: Lajean Manes M.D.   On: 01/24/2020 17:24    Procedures .Critical Care Performed by: Tacy Learn, PA-C Authorized by: Tacy Learn, PA-C   Critical care provider statement:    Critical care time (minutes):  45   Critical care was time spent personally by me on the following activities:  Discussions with consultants, evaluation of patient's response to treatment, examination of patient, ordering and performing treatments and interventions, ordering and review of laboratory studies, ordering and review of radiographic studies, pulse oximetry, re-evaluation of patient's condition, obtaining history from patient or surrogate and review of old charts   (including critical care time)  Medications Ordered in ED Medications  sodium  chloride flush (NS) 0.9 % injection 3 mL (3 mLs Intravenous Not Given 01/24/20 1746)  dexamethasone (DECADRON) injection 10 mg (10 mg Intravenous Given 01/24/20 1924)    ED Course  I have reviewed the triage vital signs and the  nursing notes.  Pertinent labs & imaging results that were available during my care of the patient were reviewed by me and considered in my medical decision making (see chart for details).  Clinical Course as of Jan 23 2206  Sun Jan 24, 2020  [place] Patient presented to urgent care on May 8 concerned that she may have Covid due to chills, headache, and generally not feeling well.  Patient had a rapid test at urgent care which was negative.  Patient had a chest x-ray on Monday showing a bilateral multifocal pneumonia, was given doxycycline and Zithromax I am.  Patient felt she was doing better and then became progressively more short of breath especially with exertion.  Patient went back to her PCP office today and was sent to the ER with concerns for her shortness of breath.  On exam, patient is tachypneic, on supplemental oxygen at 2 L resting comfortably.  Oxygen was discontinued and patient was ambulated around the room, she did drop her O2 sat to 88%.  Patient was returned to bed and placed back on 2 L with improvement.  Chest x-ray today shows worsening bilateral multifocal pneumonia, suspect patient has Covid.  Patient's rapid test is negative, I have ordered the 2-hour PCR.  CBC reviewed, white count is normal at 7.2.   [LM]  2154 CMP with elevated glucose at 307. Troponin x 2 negative (3, unchanged), LDH/triglycerides/dimer elevated, suspect due to COVID. Plan is to admit for hypoxemia, suspect due to COVID vs pneumonia. Antibiotics not initiated pending COVID results.  Case discussed With Dr. Alfonzo Feller, [facility] who will consult for admission.    [LM]    Clinical Course User Index [LM] Roque Lias   MDM Rules/Calculators/A&P                       Final Clinical Impression(s) / ED Diagnoses Final diagnoses:  Hypoxemia  Multifocal pneumonia    Rx / DC Orders ED Discharge Orders    None       Roque Lias 01/24/20 [place], [place], DO 01/24/20 2234

## 2020-01-24 NOTE — ED Notes (Signed)
Pt requesting O2

## 2020-01-24 NOTE — H&P (Signed)
History and Physical    Samantha [name] [id number] DOB: [birth year] DOA: 01/24/2020  PCP: Harlan Stains, MD (Confirm with patient/family/NH records and if not entered, this has to be entered at [facility] point of entry) Patient coming from: [facility] Urgent care, was at home  I have personally briefly reviewed patient's old medical records in [place]  Chief Complaint: cough and progressive shortness of breath  HPI: Samantha [name] is a 54 y.o. 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 Eight days prior to admission the patient was seen by her PCP for cough. She tested negative for Covid 19 but had an abnormal CXR with multi-focal infiltrates. Vantin and doxycycline were started. Earlier on the day of admission she went to [facility] for eye pain and was found to have a corneal abrasion OS. She was also short of breath and hypoxemic leading to referral to [facility] for further evaluation. She denies chest pain, neurologic symptoms, N/V.    ED Course: Hemodynamically stable and afebrile. She was hypoxemic with ambulation with O2 sat of 88%. CXR revealed worsening multiple focal hazy infiltrates. WBC normal with normal diff. Inflammatory markers were mildly elevated:  LDH 282, ferritin 272, CRP 7.1, lactic acid 1.2 - 1.1, procalcitonin 0.11, D-Dimer 0.68.Marland Kitchen Covid-19 Ag test and PCR were negative. Chemistry revealed serum glucose of 307. [facility] is consulted for admission for worsening multi-focal pneumonia.  Review of Systems: As per HPI otherwise 10 point review of systems negative. Left eye pain.   Past Medical History:  Diagnosis Date  . Diabetes mellitus without complication (HCC)    Type 2  . Thyroid disease   . Urticaria     Past Surgical History:  Procedure Laterality Date  . CESAREAN SECTION    . UTERINE FIBROID EMBOLIZATION     Social Hx -  Never married. Has one grown daughter. She lives alone with her dog. She works  as a [employer].    reports that she has never smoked. She has never used smokeless tobacco. She reports that she does not drink alcohol or use drugs.  Allergies  Allergen Reactions  . Bupropion Hives  . Lexapro [Escitalopram] Diarrhea and Nausea Only  . Augmentin [Amoxicillin-Pot Clavulanate] Itching  . Cyclobenzaprine Other (See Comments)    Excessive sleepiness  . Pravastatin Other (See Comments)    Leg cramps/ mylagia  . Atorvastatin Other (See Comments)    Leg cramps  . Doxepin Hcl Other (See Comments)    Nightmares     Family History  Problem Relation Age of Onset  . Diabetes Other   . Stroke Other      Prior to Admission medications   Medication Sig Start Date End Date Taking? Authorizing Provider  cefpodoxime (VANTIN) 200 MG tablet Take 200 mg by mouth 2 (two) times daily. 01/19/20  Yes [provider]  cetirizine (ZYRTEC) 10 MG tablet Take 1 tablet twice daily as needed Patient taking differently: Take 10 mg by mouth daily as needed for allergies. Take 1 tablet twice daily as needed 08/01/18  Yes Padgett, Rae Halsted, MD  dapagliflozin propanediol (FARXIGA) 10 MG TABS tablet Take 10 mg by mouth daily. 01/13/20  Yes Philemon Kingdom, MD  doxycycline (VIBRA-TABS) 100 MG tablet Take 100 mg by mouth every 12 (twelve) hours. 01/19/20  Yes [provider]  ibuprofen (ADVIL,MOTRIN) 800 MG tablet Take 800 mg by mouth every 8 (eight) hours as needed for mild pain or moderate pain.  Yes [provider]  insulin glargine (LANTUS SOLOSTAR) 100 UNIT/ML Solostar Pen INJECT 40 UNITS SUBCUTANEOUSLY DAILY Patient taking differently: Inject 30-40 Units into the skin 2 (two) times daily. Inject 30 units subcutaneously every morning and 40 units subcutaneously every evening. 12/24/19  Yes Philemon Kingdom, MD  levothyroxine (EUTHYROX) 100 MCG tablet Take 1 tablet (100 mcg total) by mouth daily. 12/24/19  Yes Philemon Kingdom, MD  metFORMIN  (GLUCOPHAGE XR) 500 MG 24 hr tablet Take 2 tablets (1,000 mg total) by mouth daily with breakfast. 12/24/19  Yes Philemon Kingdom, MD  NOVOLOG FLEXPEN 100 UNIT/ML FlexPen INJECT 10-25 UNITS SUBCUTANEOUSLY THREE TIMES DAILY WITH MEALS Patient taking differently: 10-25 Units 3 (three) times daily with meals.  12/24/19  Yes Philemon Kingdom, MD  omeprazole (PRILOSEC) 40 MG capsule Take 40 mg by mouth daily.    Yes [provider]  rosuvastatin (CRESTOR) 5 MG tablet Take 1 tablet (5 mg total) by mouth at bedtime. Patient taking differently: Take 5 mg by mouth daily.  10/26/16  Yes Philemon Kingdom, MD  sertraline (ZOLOFT) 100 MG tablet Take 100 mg by mouth daily. 01/14/20  Yes [provider]  VITAMIN D, ERGOCALCIFEROL, PO Take 1 capsule by mouth daily.   Yes [provider]  BD PEN NEEDLE NANO U/F 32G X 4 MM MISC USE TO INJECT INSULIN FOUR TIMES DAILY. 07/08/19   Philemon Kingdom, MD  Continuous Blood Gluc Sensor (FREESTYLE LIBRE 14 DAY SENSOR) MISC USE TO CHECK BLOOD SUGAR EVERY 14 DAYS, CHANGE EVERY 2 WEEKS 12/24/19   Philemon Kingdom, MD  famotidine (PEPCID) 20 MG tablet Take 1 tablet (20 mg total) by mouth 2 (two) times daily. Patient not taking: Reported on 01/24/2020 07/31/18   Kennith Gain, MD    Physical Exam: Vitals:   01/24/20 2000 01/24/20 2051 01/24/20 2100 01/24/20 2200  BP: (!) 129/95 122/87 127/90 126/85  Pulse: 89 88 87 84  Resp: (!) 31 (!) 23 (!) 29 (!) 24  Temp:      TempSrc:      SpO2: 100% 100% 100% 100%  Weight:      Height:        Constitutional: NAD, calm, comfortable Vitals:   01/24/20 2000 01/24/20 2051 01/24/20 2100 01/24/20 2200  BP: (!) 129/95 122/87 127/90 126/85  Pulse: 89 88 87 84  Resp: (!) 31 (!) 23 (!) 29 (!) 24  Temp:      TempSrc:      SpO2: 100% 100% 100% 100%  Weight:      Height:       General: overweight woman in no distress Eyes: PERRL, left eye with conjunctival inflammation without exudate. Lens is  clear. ENMT: Mucous membranes are moist. Posterior pharynx clear of any exudate or lesions.Normal dentition with partial lower denture.  Neck: normal, supple, no masses, no thyromegaly Respiratory: Decreased breath sounds bilaterally with end-expiratory wheeze that is mild  R>L, no rales.  Cardiovascular: Regular rate and rhythm, no murmurs / rubs / gallops. No extremity edema. 1+ pedal pulses. No carotid bruits.  Abdomen: obese,  no tenderness, no masses palpated. No hepatosplenomegaly. Bowel sounds positive.  Musculoskeletal: no clubbing / cyanosis. No joint deformity upper and lower extremities. Good ROM, no contractures. Normal muscle tone.  Skin: no rashes, lesions, ulcers. No induration Neurologic: CN 2-12 grossly intact. Sensation intact to light touchl. Strength 5/5 in all 4.  Psychiatric: Normal judgment and insight. Alert and oriented x 3. Normal mood.     Labs on Admission:  I have personally reviewed following labs and imaging studies  CBC: Recent Labs  Lab 01/24/20 1746  WBC 7.2  HGB 13.4  HCT 42.2  MCV 85.4  PLT [id number]*   Basic Metabolic Panel: Recent Labs  Lab 01/24/20 1746 01/24/20 1859  NA 142 138  K 4.2 4.0  CL 101 100  CO2 26 23  GLUCOSE 331* 307*  BUN 24* 23*  CREATININE 1.06* 0.99  CALCIUM 9.9 9.0   GFR: Estimated Creatinine Clearance: 71.6 mL/min (by C-G formula based on SCr of 0.99 mg/dL). Liver Function Tests: Recent Labs  Lab 01/24/20 1859  AST 15  ALT 20  ALKPHOS 67  BILITOT 0.4  PROT 7.4  ALBUMIN 3.2*   No results for input(s): LIPASE, AMYLASE in the last 168 hours. No results for input(s): AMMONIA in the last 168 hours. Coagulation Profile: No results for input(s): INR, PROTIME in the last 168 hours. Cardiac Enzymes: No results for input(s): CKTOTAL, CKMB, CKMBINDEX, TROPONINI in the last 168 hours. BNP (last 3 results) No results for input(s): PROBNP in the last 8760 hours. HbA1C: No results for input(s): HGBA1C in the last 72  hours. CBG: No results for input(s): GLUCAP in the last 168 hours. Lipid Profile: Recent Labs    01/24/20 1859  TRIG 248*   Thyroid Function Tests: No results for input(s): TSH, T4TOTAL, FREET4, T3FREE, THYROIDAB in the last 72 hours. Anemia Panel: Recent Labs    01/24/20 1859  FERRITIN 272   Urine analysis:    Component Value Date/Time   COLORURINE YELLOW (A) 06/10/2018 1413   APPEARANCEUR CLEAR (A) 06/10/2018 1413   LABSPEC 1.010 06/10/2018 1413   PHURINE 5.5 06/10/2018 1413   GLUCOSEU NEGATIVE 06/10/2018 1413   HGBUR NEGATIVE 06/10/2018 1413   BILIRUBINUR NEGATIVE 06/10/2018 1413   KETONESUR NEGATIVE 06/10/2018 1413   PROTEINUR NEGATIVE 06/10/2018 1413   NITRITE NEGATIVE 06/10/2018 1413   LEUKOCYTESUR NEGATIVE 06/10/2018 1413    Radiological Exams on Admission: DG Chest 2 View  Result Date: 01/24/2020 CLINICAL DATA:  Patient c/o intermittent chest pain and SOB x 1 week. Patient states she was diagnosed with pneumonia 6 days ago. Patient also reports that she tested negative for Covid. EXAM: CHEST - 2 VIEW COMPARISON:  01/18/2020 FINDINGS: Interstitial and hazy airspace opacities are noted in the mid and lower lungs mildly increased the prior study, with the increase most evident in the mid lungs. No pleural effusion or pneumothorax. Cardiac silhouette is normal in size. No mediastinal or hilar masses. No evidence of adenopathy. Skeletal structures are intact. IMPRESSION: 1. Since the prior study, the infiltrates in the lungs mildly increased, now involving the mid lungs as well as the bases, consistent with worsened multifocal pneumonia. Electronically Signed   By: Lajean Manes M.D.   On: 01/24/2020 17:24    EKG: Independently reviewed. No EKG done. Most recent EKG at PCP office normal by patient report.   Assessment/Plan Active Problems:   Hypothyroidism, postablative   Uncontrolled type 2 diabetes mellitus with hyperglycemia, with long-term current use of insulin  (HCC)   Pneumonia     1. Pneumonia - patient with multi-focal infiltrates on CXR. No leukocytosis but has been on oral abx for many days. Covid 19 negative. Procalcitonin low. May be partially treated bacterial pneumonia vs viral pneumonia, although patient reports she took a flu shot this year. Plan Med-surg admit  Viral respiratory panel, Urine for PNA markers  CT chest w/o contrast to better delineate infiltrates  Abx: Rocephin IV q  24 plus oral azithromycin  O2 by [place] to keep O2 sats >90%  Albuterol MDI with aerochamber for wheezing qid  Antiviral treatment if viral respiratory panel returns positive.   No indication for systemic steroids  May need pulmonary consult if diagnosis remains elusive  2. DM - last A1C 12/30/19 12.5%. Does follow with Dr. Wardell Heath for endocinology Plan Continue home regimen: invokana, lantus bid, metformin and sliding scale coverage  3. Hypothyroidism - continue home medication.  4. Code status - full code.   DVT prophylaxis: lovenox  Code Status: full code  Family Communication: spoke with Tauriel Kalk, daughter, [phone]. Reviewed working dx - uncertain etiology of CXR findings, and Tx plan. Answered all questions. She will be back in town tomorrow. Disposition Plan: home when medically stable Consults called: none (with names) Admission status: inpatient   Adella Hare MD [facility] Pager [phone]  If 7PM-7AM, please contact night-coverage [email or web address] Password [facility]  01/24/2020, 11:22 PM

## 2020-01-24 NOTE — ED Triage Notes (Addendum)
Patient c/o intermittent chest pain and SOB x 1 week. Patient states she was diagnosed with pneumonia 6 days ago. Patient also reports that she tested negative for Covid.   Patient went to UC today and  she was told she had a scratched cornea. Patient thought she had a contact in and was frequently trying to remove a contact and it was not there.

## 2020-01-25 DIAGNOSIS — E875 Hyperkalemia: Secondary | ICD-10-CM

## 2020-01-25 DIAGNOSIS — J9601 Acute respiratory failure with hypoxia: Secondary | ICD-10-CM

## 2020-01-25 LAB — BASIC METABOLIC PANEL
Anion gap: 13 (ref 5–15)
Anion gap: 11 (ref 5–15)
BUN: 26 mg/dL — ABNORMAL HIGH (ref 6–20)
BUN: 26 mg/dL — ABNORMAL HIGH (ref 6–20)
CO2: 24 mmol/L (ref 22–32)
CO2: 26 mmol/L (ref 22–32)
Calcium: 9.3 mg/dL (ref 8.9–10.3)
Calcium: 9.1 mg/dL (ref 8.9–10.3)
Chloride: 99 mmol/L (ref 98–111)
Chloride: 103 mmol/L (ref 98–111)
Creatinine, Ser: 1.09 mg/dL — ABNORMAL HIGH (ref 0.44–1.00)
Creatinine, Ser: 1.01 mg/dL — ABNORMAL HIGH (ref 0.44–1.00)
GFR calc Af Amer: >60 mL/min (ref >60)
GFR calc Af Amer: >60 mL/min (ref >60)
GFR calc non Af Amer: 58 mL/min — ABNORMAL LOW (ref >60)
GFR calc non Af Amer: >60 mL/min (ref >60)
Glucose, Bld: 477 mg/dL — ABNORMAL HIGH (ref 70–99)
Glucose, Bld: 211 mg/dL — ABNORMAL HIGH (ref 70–99)
Potassium: 5.2 mmol/L — ABNORMAL HIGH (ref 3.5–5.1)
Potassium: 4.2 mmol/L (ref 3.5–5.1)
Sodium: 136 mmol/L (ref 135–145)
Sodium: 140 mmol/L (ref 135–145)

## 2020-01-25 LAB — HIV ANTIBODY (ROUTINE TESTING W REFLEX): HIV Screen 4th Generation wRfx: NONREACTIVE

## 2020-01-25 LAB — GLUCOSE, CAPILLARY
Glucose-Capillary: 408 mg/dL — ABNORMAL HIGH (ref 70–99)
Glucose-Capillary: 395 mg/dL — ABNORMAL HIGH (ref 70–99)
Glucose-Capillary: 203 mg/dL — ABNORMAL HIGH (ref 70–99)
Glucose-Capillary: 193 mg/dL — ABNORMAL HIGH (ref 70–99)

## 2020-01-25 LAB — CBC
HCT: 37.3 % (ref 36.0–46.0)
Hemoglobin: 12 g/dL (ref 12.0–15.0)
MCH: 27.3 pg (ref 26.0–34.0)
MCHC: 32.2 g/dL (ref 30.0–36.0)
MCV: 85 fL (ref 80.0–100.0)
Platelets: 521 10*3/uL — ABNORMAL HIGH (ref 150–400)
RBC: 4.39 MIL/uL (ref 3.87–5.11)
RDW: 12.4 % (ref 11.5–15.5)
WBC: 6.4 10*3/uL (ref 4.0–10.5)
nRBC: 0 % (ref 0.0–0.2)

## 2020-01-25 LAB — MRSA PCR SCREENING: MRSA by PCR: NEGATIVE

## 2020-01-25 MED ORDER — INSULIN ASPART 100 UNIT/ML ~~LOC~~ SOLN
0.0000 [IU] | Freq: Every day | SUBCUTANEOUS | Status: DC
  Administered 2020-01-27: 2 [IU] via SUBCUTANEOUS

## 2020-01-25 MED ORDER — INSULIN ASPART 100 UNIT/ML ~~LOC~~ SOLN
15.0000 [IU] | Freq: Three times a day (TID) | SUBCUTANEOUS | Status: DC
  Administered 2020-01-25 – 2020-01-28 (×5): 15 [IU] via SUBCUTANEOUS

## 2020-01-25 MED ORDER — VANCOMYCIN HCL 1500 MG/300ML IV SOLN
1500.0000 mg | Freq: Once | INTRAVENOUS | Status: AC
  Administered 2020-01-25: 1500 mg via INTRAVENOUS
  Filled 2020-01-25: qty 300

## 2020-01-25 MED ORDER — INSULIN GLARGINE 100 UNIT/ML ~~LOC~~ SOLN
40.0000 [IU] | Freq: Every day | SUBCUTANEOUS | Status: DC
  Administered 2020-01-26: 40 [IU] via SUBCUTANEOUS
  Filled 2020-01-25: qty 0.4

## 2020-01-25 MED ORDER — ALBUTEROL SULFATE (2.5 MG/3ML) 0.083% IN NEBU
2.5000 mg | INHALATION_SOLUTION | Freq: Four times a day (QID) | RESPIRATORY_TRACT | Status: DC
  Administered 2020-01-25: 2.5 mg via RESPIRATORY_TRACT
  Filled 2020-01-25: qty 3

## 2020-01-25 MED ORDER — INSULIN ASPART 100 UNIT/ML ~~LOC~~ SOLN
0.0000 [IU] | Freq: Three times a day (TID) | SUBCUTANEOUS | Status: DC
  Administered 2020-01-25: 7 [IU] via SUBCUTANEOUS
  Administered 2020-01-25 (×2): 20 [IU] via SUBCUTANEOUS
  Administered 2020-01-26 (×2): 4 [IU] via SUBCUTANEOUS
  Administered 2020-01-27: 7 [IU] via SUBCUTANEOUS
  Administered 2020-01-27: 3 [IU] via SUBCUTANEOUS
  Administered 2020-01-28 (×2): 7 [IU] via SUBCUTANEOUS

## 2020-01-25 MED ORDER — ALBUTEROL SULFATE (2.5 MG/3ML) 0.083% IN NEBU
2.5000 mg | INHALATION_SOLUTION | Freq: Two times a day (BID) | RESPIRATORY_TRACT | Status: DC
  Administered 2020-01-25 – 2020-01-26 (×3): 2.5 mg via RESPIRATORY_TRACT
  Filled 2020-01-25 (×3): qty 3

## 2020-01-25 MED ORDER — GUAIFENESIN ER 600 MG PO TB12
1200.0000 mg | ORAL_TABLET | Freq: Two times a day (BID) | ORAL | Status: DC
  Administered 2020-01-25 – 2020-01-28 (×7): 1200 mg via ORAL
  Filled 2020-01-25 (×7): qty 2

## 2020-01-25 MED ORDER — SODIUM CHLORIDE 0.9 % IV SOLN
2.0000 g | Freq: Three times a day (TID) | INTRAVENOUS | Status: DC
  Administered 2020-01-25 – 2020-01-28 (×10): 2 g via INTRAVENOUS
  Filled 2020-01-25 (×11): qty 2

## 2020-01-25 MED ORDER — VANCOMYCIN HCL 750 MG/150ML IV SOLN
750.0000 mg | Freq: Two times a day (BID) | INTRAVENOUS | Status: DC
  Administered 2020-01-25: 750 mg via INTRAVENOUS
  Filled 2020-01-25 (×3): qty 150

## 2020-01-25 NOTE — Progress Notes (Signed)
Inpatient Diabetes Program Recommendations  AACE/ADA: New Consensus Statement on Inpatient Glycemic Control (2015)  Target Ranges:  Prepandial:   less than 140 mg/dL      Peak postprandial:   less than 180 mg/dL (1-2 hours)      Critically ill patients:  140 - 180 mg/dL   Lab Results  Component Value Date   GLUCAP 395 (H) 01/25/2020   HGBA1C 12.5 (A) 12/24/2019    Review of Glycemic Control  Diabetes history: DM2 Outpatient Diabetes medications: Farxiga 10 mg QD, Lantus 30 in am and 40 QHS, Novolog 10-25 units tid, metformin 1000 mg QAM Current orders for Inpatient glycemic control: Lantus 40 units bid, Novolog 0-20 units tidwc and hs, metformin 1000 mg with Breakfast  HgbA1C - 12.5% Endo - Dr Cruzita Lederer Eating 100%.  Needs meal coverage insulin. Has Freestyle Libre CGM and checks her blood sugars more than 4x/day.  Per Dr Cruzita Lederer, "stays up all night eating chips, cookies, drinking sweet drinks." Does not take meds on a regular basis.   Inpatient Diabetes Program Recommendations:     Add Novolog 15 units tidwc for meal coverage insulin if pt eats > 50% meal.  Will speak with pt about HgbA1C of 12.5%.   Follow closely.  Thank you. Lorenda Peck, RD, LDN, CDE Inpatient Diabetes Coordinator [phone]

## 2020-01-25 NOTE — Progress Notes (Signed)
Pharmacy Antibiotic Note  Samantha [name] is a 54 y.o. female admitted on 01/24/2020 with pneumonia. Patient was prescribed cefpodoxime and doxycycline PTA for PNA. On admission, patient was started on ceftriaxone + azithromycin IV. MD plans to broaden therapy due to multilobar PNA not presenting as typical CAP.  Pharmacy has been consulted for vancomycin and cefepime dosing.  PCT 0.11 WBC nl afebrile  Plan: Vancomycin 1500 mg iv load followed by 750 mg iv q 12 hours  Cefepime 2 g iv q 8 hours  Azithromycin per MD  - F/U renal function, MRSA PCR, cultures - F/U for de-escalation   Height: 5\' 6"  (167.6 cm) Weight: 83.5 kg (184 lb) IBW/kg (Calculated) : 59.3  Temp (24hrs), Avg:98.1 F (36.7 C), Min:97.7 F (36.5 C), Max:98.6 F (37 C)  Recent Labs  Lab 01/24/20 1746 01/24/20 1859 01/24/20 2025 01/25/20 0434  WBC 7.2  --   --  6.4  CREATININE 1.06* 0.99  --  1.09*  LATICACIDVEN  --  1.2 1.1  --     Estimated Creatinine Clearance: 65 mL/min (A) (by C-G formula based on SCr of 1.09 mg/dL (H)).    Allergies  Allergen Reactions  . Bupropion Hives  . Lexapro [Escitalopram] Diarrhea and Nausea Only  . Augmentin [Amoxicillin-Pot Clavulanate] Itching  . Cyclobenzaprine Other (See Comments)    Excessive sleepiness  . Pravastatin Other (See Comments)    Leg cramps/ mylagia  . Atorvastatin Other (See Comments)    Leg cramps  . Doxepin Hcl Other (See Comments)    Nightmares     Antimicrobials this admission: CTX 5/17 x 1 Azithromycin  5/17 >>  Cefepime 5/17 >> Vancomycin 5/17 >>  Dose adjustments this admission:   Microbiology results: 5/16 BCx: ngtd 5/17 Sputum: sent 5/17 respiratory panel: sent  5/1 MRSA PCR:  5/16: COVID: negative  Thank you for allowing pharmacy to be a part of this patient's care.  Ulice Dash D 01/25/2020 10:42 AM

## 2020-01-25 NOTE — Progress Notes (Signed)
[facility]  PROGRESS NOTE    Samantha [name]  [id number] DOB: 1966-05-25 DOA: 01/24/2020 PCP: Harlan Stains, MD   Brief Narrative:   Samantha [name] is a 54 y.o. 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 Eight days prior to admission the patient was seen by her PCP for cough. She tested negative for Covid 19 but had an abnormal CXR with multi-focal infiltrates. Vantin and doxycycline were started. Earlier on the day of admission she went to [facility] for eye pain and was found to have a corneal abrasion OS. She was also short of breath and hypoxemic leading to referral to [facility] for further evaluation. She denies chest pain, neurologic symptoms, N/V.   Hemodynamically stable and afebrile. She was hypoxemic with ambulation with O2 sat of 88%. CXR revealed worsening multiple focal hazy infiltrates. WBC normal with normal diff. Inflammatory markers were mildly elevated:  LDH 282, ferritin 272, CRP 7.1, lactic acid 1.2 - 1.1, procalcitonin 0.11, D-Dimer 0.68.[facility] Covid-19 Ag test and PCR were negative. Chemistry revealed serum glucose of 307. [facility] is consulted for admission for worsening multi-focal pneumonia.  5/17: Glucose up. Change SSI to resistant level. Check UA. Hold invokana d/t K+. Increase lantus to 40 units BID. Broaden abx to vanc/cefepime. Add IS , flutter valve.   Assessment & Plan:   Active Problems:   Hypothyroidism, postablative   Uncontrolled type 2 diabetes mellitus with hyperglycemia, with long-term current use of insulin (HCC)   Pneumonia  Multifocal Pneumonia      - patient with multi-focal infiltrates on CXR.      - No leukocytosis but has been on oral abx for many days.      - Covid 19 negative.      - Procalcitonin low.      - May be partially treated bacterial pneumonia vs viral pneumonia, although patient reports she took a flu shot this year.     - RVP pending, Urina ags pending     - CT chest w/o  contrast: Multifocal airspace opacity seen throughout both, predominantly at the lung bases, consistent with multifocal pneumonia.     - 5/17 change to vanc/cefepime, check MRSA swab, continue albuterol, add IS, flutter valve, mucinex.     - wean O2 as able  DM2      - last A1C 12/30/19 12.5%     - follows with Dr. Wardell Heath for endocinology     - continue home regimen: invokana, lantus bid, metformin and sliding scale coverage     - 5/17: increase ssi to resistant, increase lantus to 40 units BID, check UA  Hypothyroidism      - continue home medication  Hyperkalemia     - hold invokana, repeat BMP in evening.   DVT prophylaxis: lovenox Code Status: FULL Family Communication: Updated her dtr by phone.    Status is: Inpatient  Remains inpatient appropriate because:IV treatments appropriate due to intensity of illness or inability to take PO   Dispo: The patient is from: Home              Anticipated d/c is to: Home              Anticipated d/c date is: > 3 days              Patient currently is not medically stable to d/c.  Consultants:   None  Procedures:   None  Antimicrobials:  . Vanc, cefepime  ROS:  Reports some dyspnea . Remainder 10-pt ROS is negative for all not previously mentioned.  Subjective: "Those pancakes weren't any good anyway."  Objective: Vitals:   01/25/20 0016 01/25/20 0114 01/25/20 0622 01/25/20 0738  BP: 133/87  119/84   Pulse: 80  75 77  Resp: 17  16 14   Temp: 98.6 F (37 C)  97.7 F (36.5 C)   TempSrc:      SpO2: 99% 96% 97% 94%  Weight:      Height:        Intake/Output Summary (Last 24 hours) at 01/25/2020 0748 Last data filed at 01/25/2020 0300 Gross per 24 hour  Intake 110.42 ml  Output --  Net 110.42 ml   Filed Weights   01/24/20 1659  Weight: 83.5 kg    Examination:  General: 54 y.o. female resting in bed in NAD Eyes: PERRL, normal sclera Cardiovascular: RRR, +S1, S2, no m/g/r, equal pulses  throughout Respiratory: decreased at the bases, good air movement GI: BS+, NDNT, no masses noted, no organomegaly noted MSK: No e/c/c Neuro: Alert to name, follows commands Psyc: Appropriate interaction and affect, calm/cooperative   Data Reviewed: I have personally reviewed following labs and imaging studies.  CBC: Recent Labs  Lab 01/24/20 1746 01/25/20 0434  WBC 7.2 6.4  HGB 13.4 12.0  HCT 42.2 37.3  MCV 85.4 85.0  PLT 595* [id number]*   Basic Metabolic Panel: Recent Labs  Lab 01/24/20 1746 01/24/20 1859 01/25/20 0434  NA 142 138 136  K 4.2 4.0 5.2*  CL 101 100 99  CO2 26 23 24   GLUCOSE 331* 307* 477*  BUN 24* 23* 26*  CREATININE 1.06* 0.99 1.09*  CALCIUM 9.9 9.0 9.3   GFR: Estimated Creatinine Clearance: 65 mL/min (A) (by C-G formula based on SCr of 1.09 mg/dL (H)). Liver Function Tests: Recent Labs  Lab 01/24/20 1859  AST 15  ALT 20  ALKPHOS 67  BILITOT 0.4  PROT 7.4  ALBUMIN 3.2*   No results for input(s): LIPASE, AMYLASE in the last 168 hours. No results for input(s): AMMONIA in the last 168 hours. Coagulation Profile: No results for input(s): INR, PROTIME in the last 168 hours. Cardiac Enzymes: No results for input(s): CKTOTAL, CKMB, CKMBINDEX, TROPONINI in the last 168 hours. BNP (last 3 results) No results for input(s): PROBNP in the last 8760 hours. HbA1C: No results for input(s): HGBA1C in the last 72 hours. CBG: Recent Labs  Lab 01/25/20 0728  GLUCAP 408*   Lipid Profile: Recent Labs    01/24/20 1859  TRIG 248*   Thyroid Function Tests: No results for input(s): TSH, T4TOTAL, FREET4, T3FREE, THYROIDAB in the last 72 hours. Anemia Panel: Recent Labs    01/24/20 1859  FERRITIN 272   Sepsis Labs: Recent Labs  Lab 01/24/20 1859 01/24/20 2025  PROCALCITON 0.11  --   LATICACIDVEN 1.2 1.1    Recent Results (from the past 240 hour(s))  SARS Coronavirus 2 by RT PCR (hospital order, performed in [facility] hospital lab)  Nasopharyngeal Nasopharyngeal Swab     Status: None   Collection Time: 01/24/20  6:26 PM   Specimen: Nasopharyngeal Swab  Result Value Ref Range Status   SARS Coronavirus 2 NEGATIVE NEGATIVE Final    Comment: (NOTE) SARS-CoV-2 target nucleic acids are NOT DETECTED. The SARS-CoV-2 RNA is generally detectable in upper and lower respiratory specimens during the acute phase of infection. The lowest concentration of SARS-CoV-2 viral copies this assay can detect is 250 copies / mL.  A negative result does not preclude SARS-CoV-2 infection and should not be used as the sole basis for treatment or other patient management decisions.  A negative result may occur with improper specimen collection / handling, submission of specimen other than nasopharyngeal swab, presence of viral mutation(s) within the areas targeted by this assay, and inadequate number of viral copies (<250 copies / mL). A negative result must be combined with clinical observations, patient history, and epidemiological information. Fact Sheet for Patients:   StrictlyIdeas.no Fact Sheet for Healthcare Providers: BankingDealers.co.za This test is not yet approved or cleared  by the [place] FDA and has been authorized for detection and/or diagnosis of SARS-CoV-2 by FDA under an Emergency Use Authorization (EUA).  This EUA will remain in effect (meaning this test can be used) for the duration of the COVID-19 declaration under Section 564(b)(1) of the Act, 21 U.S.C. section 360bbb-3(b)(1), unless the authorization is terminated or revoked sooner. Performed at [facility], [place] [address], [place], [place] [address]   Blood Culture (routine x 2)     Status: None (Preliminary result)   Collection Time: 01/24/20  6:59 PM   Specimen: BLOOD  Result Value Ref Range Status   Specimen Description BLOOD LEFT ANTECUBITAL  Final   Special Requests   Final    BOTTLES  DRAWN AEROBIC AND ANAEROBIC Blood Culture results may not be optimal due to an inadequate volume of blood received in culture bottles Performed at [facility], [place] [address] [place], [place] [address]    Culture PENDING  Incomplete   Report Status PENDING  Incomplete  Blood Culture (routine x 2)     Status: None (Preliminary result)   Collection Time: 01/24/20  6:59 PM   Specimen: BLOOD  Result Value Ref Range Status   Specimen Description BLOOD RIGHT ANTECUBITAL  Final   Special Requests   Final    BOTTLES DRAWN AEROBIC AND ANAEROBIC Blood Culture results may not be optimal due to an inadequate volume of blood received in culture bottles Performed at [facility], [place] [address], [place], [place] [address]    Culture PENDING  Incomplete   Report Status PENDING  Incomplete      Radiology Studies: DG Chest 2 View  Result Date: 01/24/2020 CLINICAL DATA:  Patient c/o intermittent chest pain and SOB x 1 week. Patient states she was diagnosed with pneumonia 6 days ago. Patient also reports that she tested negative for Covid. EXAM: CHEST - 2 VIEW COMPARISON:  01/18/2020 FINDINGS: Interstitial and hazy airspace opacities are noted in the mid and lower lungs mildly increased the prior study, with the increase most evident in the mid lungs. No pleural effusion or pneumothorax. Cardiac silhouette is normal in size. No mediastinal or hilar masses. No evidence of adenopathy. Skeletal structures are intact. IMPRESSION: 1. Since the prior study, the infiltrates in the lungs mildly increased, now involving the mid lungs as well as the bases, consistent with worsened multifocal pneumonia. Electronically Signed   By: Lajean Manes M.D.   On: 01/24/2020 17:24   CT Chest Wo Contrast  Result Date: 01/25/2020 CLINICAL DATA:  Unresolved pneumonia, negative COVID EXAM: CT CHEST WITHOUT CONTRAST TECHNIQUE: Multidetector CT imaging of the chest was performed following the  standard protocol without IV contrast. COMPARISON:  Radiograph same day FINDINGS: Cardiovascular: There are no significant vascular findings. The heart size is normal. There is no pericardial thickening or effusion. Mediastinum/Nodes: Scattered pre-vascular and pretracheal lymph nodes are seen.  The thyroid gland, trachea and esophagus demonstrate no significant findings. Lungs/Pleura: Multifocal rounded patchy airspace opacities are seen throughout both lungs, predominantly within the periphery at both lung bases. Upper abdomen: The visualized portion of the upper abdomen is unremarkable. Musculoskeletal/Chest wall: There is no chest wall mass or suspicious osseous finding. No acute osseous abnormality IMPRESSION: Multifocal airspace opacity seen throughout both, predominantly at the lung bases, consistent with multifocal pneumonia. Electronically Signed   By: Prudencio Pair M.D.   On: 01/25/2020 00:04     Scheduled Meds: . albuterol  2.5 mg Nebulization BID  . azithromycin  500 mg Oral QHS  . canagliflozin  100 mg Oral QAC breakfast  . enoxaparin (LOVENOX) injection  40 mg Subcutaneous QHS  . insulin aspart  0-20 Units Subcutaneous TID WC  . insulin aspart  0-5 Units Subcutaneous QHS  . insulin glargine  30 Units Subcutaneous Daily  . insulin glargine  40 Units Subcutaneous QHS  . levothyroxine  100 mcg Oral Daily  . loratadine  10 mg Oral Daily  . metFORMIN  1,000 mg Oral Q breakfast  . pantoprazole  40 mg Oral Daily  . rosuvastatin  5 mg Oral Daily  . sertraline  100 mg Oral Daily  . sodium chloride flush  3 mL Intravenous Once  . sodium chloride flush  3 mL Intravenous Q12H   Continuous Infusions: . sodium chloride 10 mL/hr at 01/25/20 0300  . cefTRIAXone (ROCEPHIN)  IV Stopped (01/25/20 0158)     LOS: 1 day    Time spent: 25 minutes spent in the coordination of care today   Jonnie Finner, DO [facility]  If 7PM-7AM, please contact  night-coverage [email or web address] 01/25/2020, 7:48 AM

## 2020-01-26 DIAGNOSIS — H5711 Ocular pain, right eye: Secondary | ICD-10-CM

## 2020-01-26 DIAGNOSIS — J9601 Acute respiratory failure with hypoxia: Secondary | ICD-10-CM

## 2020-01-26 LAB — CBC WITH DIFFERENTIAL/PLATELET
Abs Immature Granulocytes: 0.21 10*3/uL — ABNORMAL HIGH (ref 0.00–0.07)
Basophils Absolute: 0 10*3/uL (ref 0.0–0.1)
Basophils Relative: 0 %
Eosinophils Absolute: 0.1 10*3/uL (ref 0.0–0.5)
Eosinophils Relative: 1 %
HCT: 35 % — ABNORMAL LOW (ref 36.0–46.0)
Hemoglobin: 11 g/dL — ABNORMAL LOW (ref 12.0–15.0)
Immature Granulocytes: 2 %
Lymphocytes Relative: 22 %
Lymphs Abs: 2.6 10*3/uL (ref 0.7–4.0)
MCH: 26.9 pg (ref 26.0–34.0)
MCHC: 31.4 g/dL (ref 30.0–36.0)
MCV: 85.6 fL (ref 80.0–100.0)
Monocytes Absolute: 1.1 10*3/uL — ABNORMAL HIGH (ref 0.1–1.0)
Monocytes Relative: 10 %
Neutro Abs: 7.6 10*3/uL (ref 1.7–7.7)
Neutrophils Relative %: 65 %
Platelets: 606 10*3/uL — ABNORMAL HIGH (ref 150–400)
RBC: 4.09 MIL/uL (ref 3.87–5.11)
RDW: 12.5 % (ref 11.5–15.5)
WBC: 11.6 10*3/uL — ABNORMAL HIGH (ref 4.0–10.5)
nRBC: 0 % (ref 0.0–0.2)

## 2020-01-26 LAB — GLUCOSE, CAPILLARY
Glucose-Capillary: 188 mg/dL — ABNORMAL HIGH (ref 70–99)
Glucose-Capillary: 193 mg/dL — ABNORMAL HIGH (ref 70–99)
Glucose-Capillary: 109 mg/dL — ABNORMAL HIGH (ref 70–99)
Glucose-Capillary: 148 mg/dL — ABNORMAL HIGH (ref 70–99)

## 2020-01-26 LAB — COMPREHENSIVE METABOLIC PANEL
ALT: 18 U/L (ref 0–44)
AST: 12 U/L — ABNORMAL LOW (ref 15–41)
Albumin: 3.1 g/dL — ABNORMAL LOW (ref 3.5–5.0)
Alkaline Phosphatase: 60 U/L (ref 38–126)
Anion gap: 10 (ref 5–15)
BUN: 26 mg/dL — ABNORMAL HIGH (ref 6–20)
CO2: 24 mmol/L (ref 22–32)
Calcium: 9 mg/dL (ref 8.9–10.3)
Chloride: 108 mmol/L (ref 98–111)
Creatinine, Ser: 0.87 mg/dL (ref 0.44–1.00)
GFR calc Af Amer: >60 mL/min (ref >60)
GFR calc non Af Amer: >60 mL/min (ref >60)
Glucose, Bld: 235 mg/dL — ABNORMAL HIGH (ref 70–99)
Potassium: 3.9 mmol/L (ref 3.5–5.1)
Sodium: 142 mmol/L (ref 135–145)
Total Bilirubin: 0.8 mg/dL (ref 0.3–1.2)
Total Protein: 6.9 g/dL (ref 6.5–8.1)

## 2020-01-26 LAB — MAGNESIUM: Magnesium: 2.2 mg/dL (ref 1.7–2.4)

## 2020-01-26 MED ORDER — PHENOL 1.4 % MT LIQD
1.0000 | OROMUCOSAL | Status: DC | PRN
  Administered 2020-01-26: 1 via OROMUCOSAL
  Filled 2020-01-26: qty 177

## 2020-01-26 MED ORDER — ALBUTEROL SULFATE (2.5 MG/3ML) 0.083% IN NEBU: 2.5000 mg | INHALATION_SOLUTION | RESPIRATORY_TRACT | Status: DC | PRN

## 2020-01-26 MED ORDER — INSULIN GLARGINE 100 UNIT/ML ~~LOC~~ SOLN
43.0000 [IU] | Freq: Two times a day (BID) | SUBCUTANEOUS | Status: DC
  Administered 2020-01-26 – 2020-01-28 (×4): 43 [IU] via SUBCUTANEOUS
  Filled 2020-01-26 (×4): qty 0.43

## 2020-01-26 NOTE — Consult Note (Signed)
Ophthalmology Initial Consult Note  Samantha [name], Samantha [name], 54 y.o. female Date of Service:  01/26/2020  Requesting physician: Jonnie Finner, DO  Information Obtained from: patient Chief Complaint:  Eye pain OS  HPI/Discussion:  Samantha [name] is a 54 y.o. female who is inpatient for pneumonia and complaining of vague eye pain OS for many days. Was seen in urgent care and noted to have a corneal abrasion OS. Several days later symptoms are no better. She reports mild blurry vision OS. No photophobia.  Past Ocular Hx:  Refractive error, wears contact lenses Ocular Meds:  None Family ocular history: Noncontributory  Past Medical History:  Diagnosis Date  . Diabetes mellitus without complication (HCC)    Type 2  . Thyroid disease   . Urticaria    Past Surgical History:  Procedure Laterality Date  . CESAREAN SECTION    . UTERINE FIBROID EMBOLIZATION      Prior to Admission Meds: Medications Prior to Admission  Medication Sig Dispense Refill Last Dose  . cefpodoxime (VANTIN) 200 MG tablet Take 200 mg by mouth 2 (two) times daily.   01/23/2020 at Unknown time  . cetirizine (ZYRTEC) 10 MG tablet Take 1 tablet twice daily as needed (Patient taking differently: Take 10 mg by mouth daily as needed for allergies. Take 1 tablet twice daily as needed) 60 tablet 5 unknown  . dapagliflozin propanediol (FARXIGA) 10 MG TABS tablet Take 10 mg by mouth daily. 90 tablet 3 Past Week at Unknown time  . doxycycline (VIBRA-TABS) 100 MG tablet Take 100 mg by mouth every 12 (twelve) hours.   01/23/2020 at Unknown time  . ibuprofen (ADVIL,MOTRIN) 800 MG tablet Take 800 mg by mouth every 8 (eight) hours as needed for mild pain or moderate pain.    unknown  . insulin glargine (LANTUS SOLOSTAR) 100 UNIT/ML Solostar Pen INJECT 40 UNITS SUBCUTANEOUSLY DAILY (Patient taking differently: Inject 30-40 Units into the skin 2 (two) times daily. Inject 30 units subcutaneously every morning and 40 units subcutaneously  every evening.) 30 mL 3 Past Week at Unknown time  . levothyroxine (EUTHYROX) 100 MCG tablet Take 1 tablet (100 mcg total) by mouth daily. 90 tablet 3 01/23/2020 at Unknown time  . metFORMIN (GLUCOPHAGE XR) 500 MG 24 hr tablet Take 2 tablets (1,000 mg total) by mouth daily with breakfast. 180 tablet 3 Past Week at Unknown time  . NOVOLOG FLEXPEN 100 UNIT/ML FlexPen INJECT 10-25 UNITS SUBCUTANEOUSLY THREE TIMES DAILY WITH MEALS (Patient taking differently: 10-25 Units 3 (three) times daily with meals. ) 30 mL 3 Past Week at Unknown time  . omeprazole (PRILOSEC) 40 MG capsule Take 40 mg by mouth daily.    Past Week at Unknown time  . rosuvastatin (CRESTOR) 5 MG tablet Take 1 tablet (5 mg total) by mouth at bedtime. (Patient taking differently: Take 5 mg by mouth daily. ) 90 tablet 0 Past Week at Unknown time  . sertraline (ZOLOFT) 100 MG tablet Take 100 mg by mouth daily.   Past Week at Unknown time  . VITAMIN D, ERGOCALCIFEROL, PO Take 1 capsule by mouth daily.   Past Week at Unknown time  . BD PEN NEEDLE NANO U/F 32G X 4 MM MISC USE TO INJECT INSULIN FOUR TIMES DAILY. 400 each 11   . Continuous Blood Gluc Sensor (FREESTYLE LIBRE 14 DAY SENSOR) MISC USE TO CHECK BLOOD SUGAR EVERY 14 DAYS, CHANGE EVERY 2 WEEKS 6 each 3   . famotidine (PEPCID) 20 MG tablet Take 1 tablet (  20 mg total) by mouth 2 (two) times daily. (Patient not taking: Reported on 01/24/2020) 60 tablet 5 Not Taking at Unknown time    Inpatient Meds: See chart  Allergies  Allergen Reactions  . Bupropion Hives  . Lexapro [Escitalopram] Diarrhea and Nausea Only  . Augmentin [Amoxicillin-Pot Clavulanate] Itching  . Cyclobenzaprine Other (See Comments)    Excessive sleepiness  . Pravastatin Other (See Comments)    Leg cramps/ mylagia  . Atorvastatin Other (See Comments)    Leg cramps  . Doxepin Hcl Other (See Comments)    Nightmares    Social History   Tobacco Use  . Smoking status: Never Smoker  . Smokeless tobacco: Never  Used  Substance Use Topics  . Alcohol use: No    Alcohol/week: 0.0 standard drinks   Family History  Problem Relation Age of Onset  . Diabetes Other   . Stroke Other     ROS: Other than ROS in the HPI, all other systems were negative.  Exam: Temp: 98 F (36.7 C) Pulse Rate: 85 BP: 125/84 Resp: 16 SpO2: 98 %  Visual Acuity:  near   OD 20/25   OS 20/40-1     OD OS  Confr Vis Fields Full Full  EOM (Primary) Full Full  Lids/Lashes Normal Normal  Conjunctiva Trace diffuse injection 1+ diffuse injection  Adnexa  Normal Normal  Pupils  3 --> 2, brisk, no rAPD 3 --> 2, brisk, no rAPD  Cornea  Clear, contact lens in place Clear, contact lens in place. Vertical scar near the visual axis. No ulcer, no abrasion.  Anterior Chamber Formed, grossly quiet Formed, grossly quiet  Lens:  Clear Clear  IOP 21 23  Fundus - Dilated? NO    Neuro:  Oriented to person, place, and time:  Yes Psychiatric:  Mood and Affect Appropriate:  Yes  Labs/imaging: noncontributory  A/P:  54 y.o. female and contact lens wearer with eye pain OS  1) Eye pain OS - 2/2 contact lens in eye - Unbeknownst to the patient she had a contact lens in each eye. - Both contact lenses were removed. - Advised patient to never sleep in contact lenses and to clean/change them regularly.  2) Dry eyes OU - recommend artificial tears 3-4x/day OU PRN  3) Graves disease - no obvious ocular involvement, but warrants comprehensive exam in clinic  4) Ocular hypertension OU, mild - Could be due to patient squeezing, but needs to be evaluated in clinic to better assess for glaucoma. - This does not require urgent treatment.  Recommend non-urgent ophthalmology follow up upon discharge. She is already an established patient of our office. If there are any additional concerns during her hospitalization, do not hesitate to call me. My cell is listed in [facility].  R Wyatt Portela, MD  R Wyatt Portela, MD 01/26/2020, 7:11 PM

## 2020-01-26 NOTE — Progress Notes (Signed)
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 Eight days prior to admission the patient was seen by her PCP for cough. She tested negative for Covid 19 but had an abnormal CXR with multi-focal infiltrates. Vantin and doxycycline were started. Earlier on the day of admission she went to [facility] for eye pain and was found to have a corneal abrasion OS. She was also short of breath and hypoxemic leading to referral to [facility] for further evaluation. She denies chest pain, neurologic symptoms, N/V. Hemodynamically stable and afebrile. She was hypoxemic with ambulation with O2 sat of 88%. CXR revealed worsening multiple focal hazy infiltrates. WBC normal with normal diff. Inflammatory markers were mildly elevated: LDH 282, ferritin 272, CRP 7.1, lactic acid 1.2 - 1.1, procalcitonin 0.11, D-Dimer 0.68.Marland Kitchen Covid-19 Ag test and PCR were negative. Chemistry revealed serum glucose of 307. [facility] is consulted for admission for worsening multi-focal pneumonia.  5/17: Glucose up. Change SSI to resistant level. Check UA. Hold invokana d/t K+. Increase lantus to 40 units BID. Broaden abx to vanc/cefepime. Add IS , flutter valve.  5/18: MRSA swab is negative. Can drop vanc. Continue nebs, mucinex. IS ordered as well as flutter. Glucose is better today; but fasting sugar is still 235. Will increase lantus today. Has spoken with ophtho. They will see her for right eye pain this afternoon. Appreciate their assistance.     Assessment & Plan:   Active Problems:   Hypothyroidism, postablative   Uncontrolled type 2 diabetes mellitus with hyperglycemia, with long-term current use of insulin (HCC)   Pneumonia   Acute right eye pain   Acute respiratory failure with hypoxia (HCC)  Multifocal Pneumonia       - patient with multi-focal infiltrates on CXR.      - No leukocytosis but has been on oral abx for many days.      - Covid 19 negative.      - Procalcitonin low.      - May be partially treated bacterial pneumonia vs viral pneumonia, although patient reports she took a flu shot this year.     - RVP pending, Urine ags pending     - CT chest w/o contrast: Multifocal airspace opacity seen throughout both, predominantly at the lung bases, consistent with multifocal pneumonia.     - 5/18: MRSA swab is negative, can drop vanc, continue cefepime. O2 use is decreasing although she is still dyspneic with movement. Continue IS, nebs, flutter, mucinex.  DM2      - last A1C 12/30/19 12.5%     - follows with Dr. Wardell Heath for endocinology     - continue home regimen: invokana, lantus bid, metformin and sliding scale coverage     - 5/17: increase ssi to resistant, increase lantus to 40 units BID, check UA     - 5/18: glucose better today. Increasing lantus to 43 units BID  Hypothyroidism      - continue home levothyroxine 150mcg  Hyperkalemia     - hold invokana, repeat BMP in evening.     - 5/18:  K+ is 3.9. Mg2+ 2.2; this issue is resolved.  Right eye pain     - have d/w ophtho; they will see her today, appreciate assistance  DVT prophylaxis: lovenox Code Status: FULL   Status is:  Inpatient  Remains inpatient appropriate because:IV treatments appropriate due to intensity of illness or inability to take PO   Dispo: The patient is from: Home              Anticipated d/c is to: Home              Anticipated d/c date is: May 22              Patient currently is not medically stable to d/c.  Consultants:   None  Procedures:   None  Antimicrobials:  . Cefepime   ROS:  Reports dyspnea, right eye pain . Remainder 10-pt ROS is negative for all not previously mentioned.  Subjective: "It hurts to cough."  Objective: Vitals:   01/25/20 2018 01/25/20 2031 01/26/20 0524  01/26/20 0829  BP: 118/73  117/85   Pulse: 92  80   Resp: 18  20   Temp: (!) 97.4 F (36.3 C)  97.9 F (36.6 C)   TempSrc: Oral  Oral   SpO2: 95% 92% 98% 91%  Weight:      Height:        Intake/Output Summary (Last 24 hours) at 01/26/2020 1112 Last data filed at 01/26/2020 0300 Gross per 24 hour  Intake 1029.04 ml  Output --  Net 1029.04 ml   Filed Weights   01/24/20 1659  Weight: 83.5 kg    Examination:  General: 54 y.o. female resting in bed in NAD Cardiovascular: RRR, +S1, S2, no m/g/r, equal pulses throughout Respiratory: decreased at bases, tight superiorly GI: BS+, NDNT, soft MSK: No e/c/c Neuro: A&O x 3, no focal deficits Psyc: Appropriate interaction and affect, calm/cooperative  Data Reviewed: I have personally reviewed following labs and imaging studies.  CBC: Recent Labs  Lab 01/24/20 1746 01/25/20 0434 01/26/20 0513  WBC 7.2 6.4 11.6*  NEUTROABS  --   --  7.6  HGB 13.4 12.0 11.0*  HCT 42.2 37.3 35.0*  MCV 85.4 85.0 85.6  PLT 595* 521* [id number]*   Basic Metabolic Panel: Recent Labs  Lab 01/24/20 1746 01/24/20 1859 01/25/20 0434 01/25/20 1733 01/26/20 0513  NA 142 138 136 140 142  K 4.2 4.0 5.2* 4.2 3.9  CL 101 100 99 103 108  CO2 26 23 24 26 24   GLUCOSE 331* 307* 477* 211* 235*  BUN 24* 23* 26* 26* 26*  CREATININE 1.06* 0.99 1.09* 1.01* 0.87  CALCIUM 9.9 9.0 9.3 9.1 9.0  MG  --   --   --   --  2.2   GFR: Estimated Creatinine Clearance: 81.5 mL/min (by C-G formula based on SCr of 0.87 mg/dL). Liver Function Tests: Recent Labs  Lab 01/24/20 1859 01/26/20 0513  AST 15 12*  ALT 20 18  ALKPHOS 67 60  BILITOT 0.4 0.8  PROT 7.4 6.9  ALBUMIN 3.2* 3.1*   No results for input(s): LIPASE, AMYLASE in the last 168 hours. No results for input(s): AMMONIA in the last 168 hours. Coagulation Profile: No results for input(s): INR, PROTIME in the last 168 hours. Cardiac Enzymes: No results for input(s): CKTOTAL, CKMB, CKMBINDEX, TROPONINI in the  last 168 hours. BNP (last 3 results) No results for input(s): PROBNP in the last 8760 hours. HbA1C: No results for input(s): HGBA1C in the last 72 hours. CBG: Recent Labs  Lab 01/25/20 0728 01/25/20 1218 01/25/20 1725 01/25/20 2020 01/26/20 0736  GLUCAP 408* 395* 203* 193* 188*   Lipid Profile: Recent Labs    01/24/20 1859  TRIG 248*  Thyroid Function Tests: No results for input(s): TSH, T4TOTAL, FREET4, T3FREE, THYROIDAB in the last 72 hours. Anemia Panel: Recent Labs    01/24/20 1859  FERRITIN 272   Sepsis Labs: Recent Labs  Lab 01/24/20 1859 01/24/20 2025  PROCALCITON 0.11  --   LATICACIDVEN 1.2 1.1    Recent Results (from the past 240 hour(s))  SARS Coronavirus 2 by RT PCR (hospital order, performed in [facility] hospital lab) Nasopharyngeal Nasopharyngeal Swab     Status: None   Collection Time: 01/24/20  6:26 PM   Specimen: Nasopharyngeal Swab  Result Value Ref Range Status   SARS Coronavirus 2 NEGATIVE NEGATIVE Final    Comment: (NOTE) SARS-CoV-2 target nucleic acids are NOT DETECTED. The SARS-CoV-2 RNA is generally detectable in upper and lower respiratory specimens during the acute phase of infection. The lowest concentration of SARS-CoV-2 viral copies this assay can detect is 250 copies / mL. A negative result does not preclude SARS-CoV-2 infection and should not be used as the sole basis for treatment or other patient management decisions.  A negative result may occur with improper specimen collection / handling, submission of specimen other than nasopharyngeal swab, presence of viral mutation(s) within the areas targeted by this assay, and inadequate number of viral copies (<250 copies / mL). A negative result must be combined with clinical observations, patient history, and epidemiological information. Fact Sheet for Patients:   StrictlyIdeas.no Fact Sheet for Healthcare  Providers: BankingDealers.co.za This test is not yet approved or cleared  by the [place] FDA and has been authorized for detection and/or diagnosis of SARS-CoV-2 by FDA under an Emergency Use Authorization (EUA).  This EUA will remain in effect (meaning this test can be used) for the duration of the COVID-19 declaration under Section 564(b)(1) of the Act, 21 U.S.C. section 360bbb-3(b)(1), unless the authorization is terminated or revoked sooner. Performed at [facility], [place] [address] [place], [place] [address]   Blood Culture (routine x 2)     Status: None (Preliminary result)   Collection Time: 01/24/20  6:59 PM   Specimen: BLOOD  Result Value Ref Range Status   Specimen Description BLOOD LEFT ANTECUBITAL  Final   Special Requests   Final    BOTTLES DRAWN AEROBIC AND ANAEROBIC Blood Culture results may not be optimal due to an inadequate volume of blood received in culture bottles Performed at [facility], [place] [address] [place], [place] [address]    Culture   Final    NO GROWTH 1 DAY Performed at [facility], [place] [address] [place], [place] [address]    Report Status PENDING  Incomplete  Blood Culture (routine x 2)     Status: None (Preliminary result)   Collection Time: 01/24/20  6:59 PM   Specimen: BLOOD  Result Value Ref Range Status   Specimen Description BLOOD RIGHT ANTECUBITAL  Final   Special Requests   Final    BOTTLES DRAWN AEROBIC AND ANAEROBIC Blood Culture results may not be optimal due to an inadequate volume of blood received in culture bottles Performed at [facility], [place] [address] [place], [place] [address]    Culture   Final    NO GROWTH 1 DAY Performed at [facility], [place] [address] [place], [place] [address]    Report Status PENDING  Incomplete  MRSA PCR Screening     Status: None   Collection Time: 01/25/20  3:30 PM   Specimen: Nasopharyngeal  Result Value Ref Range Status   MRSA by PCR NEGATIVE NEGATIVE Final    Comment:        The GeneXpert MRSA Assay (FDA approved for NASAL specimens only), is one component of a comprehensive MRSA colonization surveillance program. It is not intended to diagnose MRSA infection nor to guide or monitor treatment for MRSA infections. Performed at [facility], [place] [address], [place], [place] [address]       Radiology Studies: DG Chest 2 View  Result Date: 01/24/2020 CLINICAL DATA:  Patient c/o intermittent chest pain and SOB x 1 week. Patient states she was diagnosed with pneumonia 6 days ago. Patient also reports that she tested negative for Covid. EXAM: CHEST - 2 VIEW COMPARISON:  01/18/2020 FINDINGS: Interstitial and hazy airspace opacities are noted in the mid and lower lungs mildly increased the prior study, with the increase most evident in the mid lungs. No pleural effusion or pneumothorax. Cardiac silhouette is normal in size. No mediastinal or hilar masses. No evidence of adenopathy. Skeletal structures are intact. IMPRESSION: 1. Since the prior study, the infiltrates in the lungs mildly increased, now involving the mid lungs as well as the bases, consistent with worsened multifocal pneumonia. Electronically Signed   By: Lajean Manes M.D.   On: 01/24/2020 17:24   CT Chest Wo Contrast  Result Date: 01/25/2020 CLINICAL DATA:  Unresolved pneumonia, negative COVID EXAM: CT CHEST WITHOUT CONTRAST TECHNIQUE: Multidetector CT imaging of the chest was performed following the standard protocol without IV contrast. COMPARISON:  Radiograph same day FINDINGS: Cardiovascular: There are no significant vascular findings. The heart size is normal. There is no pericardial thickening or effusion. Mediastinum/Nodes: Scattered pre-vascular and pretracheal lymph nodes are seen. The thyroid gland, trachea and esophagus demonstrate no significant findings. Lungs/Pleura: Multifocal rounded  patchy airspace opacities are seen throughout both lungs, predominantly within the periphery at both lung bases. Upper abdomen: The visualized portion of the upper abdomen is unremarkable. Musculoskeletal/Chest wall: There is no chest wall mass or suspicious osseous finding. No acute osseous abnormality IMPRESSION: Multifocal airspace opacity seen throughout both, predominantly at the lung bases, consistent with multifocal pneumonia. Electronically Signed   By: Prudencio Pair M.D.   On: 01/25/2020 00:04     Scheduled Meds: . enoxaparin (LOVENOX) injection  40 mg Subcutaneous QHS  . guaiFENesin  1,200 mg Oral BID  . insulin aspart  0-20 Units Subcutaneous TID WC  . insulin aspart  0-5 Units Subcutaneous QHS  . insulin aspart  15 Units Subcutaneous TID WC  . insulin glargine  40 Units Subcutaneous QHS  . insulin glargine  40 Units Subcutaneous Daily  . levothyroxine  100 mcg Oral Daily  . loratadine  10 mg Oral Daily  . metFORMIN  1,000 mg Oral Q breakfast  . pantoprazole  40 mg Oral Daily  . rosuvastatin  5 mg Oral Daily  . sertraline  100 mg Oral Daily  . sodium chloride flush  3 mL Intravenous Once  . sodium chloride flush  3 mL Intravenous Q12H   Continuous Infusions: . sodium chloride 10 mL/hr at 01/26/20 0300  . ceFEPime (MAXIPIME) IV 2 g (01/26/20 1100)     LOS: 2 days    Time spent: 25 minutes spent in the coordination of care today.    Jonnie Finner, DO [facility]  If 7PM-7AM, please contact night-coverage [email or web address] 01/26/2020, 11:12 AM

## 2020-01-27 LAB — CBC
HCT: 35.5 % — ABNORMAL LOW (ref 36.0–46.0)
Hemoglobin: 11.4 g/dL — ABNORMAL LOW (ref 12.0–15.0)
MCH: 27.3 pg (ref 26.0–34.0)
MCHC: 32.1 g/dL (ref 30.0–36.0)
MCV: 85.1 fL (ref 80.0–100.0)
Platelets: 614 10*3/uL — ABNORMAL HIGH (ref 150–400)
RBC: 4.17 MIL/uL (ref 3.87–5.11)
RDW: 12.4 % (ref 11.5–15.5)
WBC: 9.8 10*3/uL (ref 4.0–10.5)
nRBC: 0 % (ref 0.0–0.2)

## 2020-01-27 LAB — COMPREHENSIVE METABOLIC PANEL
ALT: 15 U/L (ref 0–44)
AST: 11 U/L — ABNORMAL LOW (ref 15–41)
Albumin: 2.5 g/dL — ABNORMAL LOW (ref 3.5–5.0)
Alkaline Phosphatase: 46 U/L (ref 38–126)
Anion gap: 13 (ref 5–15)
BUN: 18 mg/dL (ref 6–20)
CO2: 20 mmol/L — ABNORMAL LOW (ref 22–32)
Calcium: 8.6 mg/dL — ABNORMAL LOW (ref 8.9–10.3)
Chloride: 109 mmol/L (ref 98–111)
Creatinine, Ser: 0.76 mg/dL (ref 0.44–1.00)
GFR calc Af Amer: >60 mL/min (ref >60)
GFR calc non Af Amer: >60 mL/min (ref >60)
Glucose, Bld: 136 mg/dL — ABNORMAL HIGH (ref 70–99)
Potassium: 3.6 mmol/L (ref 3.5–5.1)
Sodium: 142 mmol/L (ref 135–145)
Total Bilirubin: 0.3 mg/dL (ref 0.3–1.2)
Total Protein: 5.4 g/dL — ABNORMAL LOW (ref 6.5–8.1)

## 2020-01-27 LAB — GLUCOSE, CAPILLARY
Glucose-Capillary: 134 mg/dL — ABNORMAL HIGH (ref 70–99)
Glucose-Capillary: 216 mg/dL — ABNORMAL HIGH (ref 70–99)
Glucose-Capillary: 210 mg/dL — ABNORMAL HIGH (ref 70–99)
Glucose-Capillary: 223 mg/dL — ABNORMAL HIGH (ref 70–99)

## 2020-01-27 NOTE — Progress Notes (Signed)
PROGRESS NOTE    Samantha [name]  [id number] DOB: 09/13/1965 DOA: 01/24/2020 PCP: Harlan Stains, MD   Chief Complaint  Patient presents with  . Shortness of Breath  . 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 Eight days prior to admission the patient was seen by her PCP for cough. She tested negative for Covid 19 but had an abnormal CXR with multi-focal infiltrates. Vantin and doxycycline were started. Earlier on the day of admission she went to [facility] for eye pain and was found to have Samantha [name] corneal abrasion OS. She was also short of breath and hypoxemic leading to referral to [facility] for further evaluation. She denies chest pain, neurologic symptoms, N/V. Hemodynamically stable and afebrile. She was hypoxemic with ambulation with O2 sat of 88%. CXR revealed worsening multiple focal hazy infiltrates. WBC normal with normal diff. Inflammatory markers were mildly elevated: LDH 282, ferritin 272, CRP 7.1, lactic acid 1.2 - 1.1, procalcitonin 0.11, D-Dimer 0.68.Marland Kitchen Covid-19 Ag test and PCR were negative. Chemistry revealed serum glucose of 307. [facility] is consulted for admission for worsening multi-focal pneumonia.  5/17: Glucose up. Change SSI to resistant level. Check UA.Hold invokana d/t K+. Increase lantus to 40 units BID. Broaden abx to vanc/cefepime. Add IS , flutter valve.  5/18: MRSA swab is negative. Can drop vanc. Continue nebs, mucinex. IS ordered as well as flutter. Glucose is better today; but fasting sugar is still 235. Will increase lantus today. Has spoken with ophtho. They will see her for right eye pain this afternoon. Appreciate their assistance.    Assessment & Plan:   Active Problems:   Hypothyroidism, postablative   Uncontrolled type 2 diabetes mellitus with hyperglycemia, with long-term current use of insulin (HCC)   Pneumonia   Acute right  eye pain   Acute respiratory failure with hypoxia (HCC)  Multifocal Pneumonia  - ? Partially treated bacterial pneumonia vs viral pneumonia     - patient with multi-focal infiltrates on CXR     - CT with multifocal airspace opacity, c/w multifocal pneumonia - No leukocytosis but has been on oral abx for many days.  - Covid 19 negative     - RVP ordered, but not collected -> at this point, I do not think it will change management -> she's improving  - Procalcitonin low.  - RVP pending, Urine ags pending     - MRSA negative     - Continue cefepime   DM2  - last A1C 12/30/19 12.5% - follows with Dr. Wardell Heath for endocinology - continue lantus and mealtime, SSI  Hypothyroidism  - continue home levothyroxine 159mcg  Hyperkalemia - improved, follow  Right eye pain     - pt had contact lens in eyes, but did not know -> these were removed     - artificial tears for dry eyes     - needs outpatient follow up for ocular hypertension     - see note from 5/18  DVT prophylaxis: lovenox Code Status: full  Family Communication: none at bedside Disposition:   Status is: Inpatient  Remains inpatient appropriate because:Inpatient level of care appropriate due to severity of illness   Dispo: The patient is from: home              Anticipated d/c is to: pending therapy              Anticipated d/c  date is: 1 day              Patient currently is not medically stable to d/c.   Consultants:   none  Procedures:  none  Antimicrobials:  Anti-infectives (From admission, onward)   Start     Dose/Rate Route Frequency Ordered Stop   01/25/20 2330  vancomycin (VANCOREADY) IVPB 750 mg/150 mL  Status:  Discontinued     750 mg 150 mL/hr over 60 Minutes Intravenous Every 12 hours 01/25/20 1040 01/26/20 1028   01/25/20 1130  ceFEPIme (MAXIPIME) 2 g in sodium chloride 0.9 % 100 mL IVPB     2 g 200 mL/hr over 30 Minutes Intravenous Every 8 hours  01/25/20 1040     01/25/20 1130  vancomycin (VANCOREADY) IVPB 1500 mg/300 mL     1,500 mg 150 mL/hr over 120 Minutes Intravenous  Once 01/25/20 1040 01/25/20 1424   01/24/20 2330  azithromycin (ZITHROMAX) tablet 500 mg  Status:  Discontinued     500 mg Oral Daily at bedtime 01/24/20 2304 01/25/20 1338   01/24/20 2315  azithromycin (ZITHROMAX) tablet 500 mg  Status:  Discontinued     500 mg Oral Daily 01/24/20 2254 01/24/20 2304   01/24/20 2300  cefTRIAXone (ROCEPHIN) 2 g in sodium chloride 0.9 % 100 mL IVPB  Status:  Discontinued     2 g 200 mL/hr over 30 Minutes Intravenous Every 24 hours 01/24/20 2254 01/25/20 1022      Subjective: Feels better overall, not quite back to baseline  Objective: Vitals:   01/26/20 1457 01/26/20 2114 01/27/20 0526 01/27/20 1423  BP: 125/84 136/84 131/80 129/85  Pulse: 85 88 78 95  Resp: 16 16 14 18   Temp: 98 F (36.7 C) 97.9 F (36.6 C) 98.1 F (36.7 C) 99.1 F (37.3 C)  TempSrc: Oral Oral Oral Oral  SpO2: 98% (!) 89% 91% 92%  Weight:      Height:        Intake/Output Summary (Last 24 hours) at 01/27/2020 1746 Last data filed at 01/27/2020 0700 Gross per 24 hour  Intake 659.8 ml  Output --  Net 659.8 ml   Filed Weights   01/24/20 1659  Weight: 83.5 kg    Examination:  General exam: Appears calm and comfortable  Respiratory system: Clear to auscultation. Respiratory effort normal. Cardiovascular system: S1 & S2 heard, RRR.  Gastrointestinal system: Abdomen is nondistended, soft and nontender Central nervous system: Alert and oriented. No focal neurological deficits. Extremities: Symmetric 5 x 5 power. Skin: No rashes, lesions or ulcers Psychiatry: Judgement and insight appear normal. Mood & affect appropriate.     Data Reviewed: I have personally reviewed following labs and imaging studies  CBC: Recent Labs  Lab 01/24/20 1746 01/25/20 0434 01/26/20 0513 01/27/20 0805  WBC 7.2 6.4 11.6* 9.8  NEUTROABS  --   --  7.6  --    HGB 13.4 12.0 11.0* 11.4*  HCT 42.2 37.3 35.0* 35.5*  MCV 85.4 85.0 85.6 85.1  PLT 595* 521* 606* 614*    Basic Metabolic Panel: Recent Labs  Lab 01/24/20 1859 01/25/20 0434 01/25/20 1733 01/26/20 0513 01/27/20 0805  NA 138 136 140 142 142  K 4.0 5.2* 4.2 3.9 3.6  CL 100 99 103 108 109  CO2 23 24 26 24  20*  GLUCOSE 307* 477* 211* 235* 136*  BUN 23* 26* 26* 26* 18  CREATININE 0.99 1.09* 1.01* 0.87 0.76  CALCIUM 9.0 9.3 9.1 9.0 8.6*  MG  --   --   --  2.2  --     GFR: Estimated Creatinine Clearance: 88.6 mL/min (by C-G formula based on SCr of 0.76 mg/dL).  Liver Function Tests: Recent Labs  Lab 01/24/20 1859 01/26/20 0513 01/27/20 0805  AST 15 12* 11*  ALT 20 18 15   ALKPHOS 67 60 46  BILITOT 0.4 0.8 0.3  PROT 7.4 6.9 5.4*  ALBUMIN 3.2* 3.1* 2.5*    CBG: Recent Labs  Lab 01/26/20 1656 01/26/20 2112 01/27/20 0803 01/27/20 1146 01/27/20 1651  GLUCAP 109* 148* 134* 216* 210*     Recent Results (from the past 240 hour(s))  SARS Coronavirus 2 by RT PCR (hospital order, performed in [facility] hospital lab) Nasopharyngeal Nasopharyngeal Swab     Status: None   Collection Time: 01/24/20  6:26 PM   Specimen: Nasopharyngeal Swab  Result Value Ref Range Status   SARS Coronavirus 2 NEGATIVE NEGATIVE Final    Comment: (NOTE) SARS-CoV-2 target nucleic acids are NOT DETECTED. The SARS-CoV-2 RNA is generally detectable in upper and lower respiratory specimens during the acute phase of infection. The lowest concentration of SARS-CoV-2 viral copies this assay can detect is 250 copies / mL. Dalon Reichart negative result does not preclude SARS-CoV-2 infection and should not be used as the sole basis for treatment or other patient management decisions.  Ahri Olson negative result may occur with improper specimen collection / handling, submission of specimen other than nasopharyngeal swab, presence of viral mutation(s) within the areas targeted by this assay, and inadequate number of  viral copies (<250 copies / mL). Reyes Aldaco negative result must be combined with clinical observations, patient history, and epidemiological information. Fact Sheet for Patients:   StrictlyIdeas.no Fact Sheet for Healthcare Providers: BankingDealers.co.za This test is not yet approved or cleared  by the [place] FDA and has been authorized for detection and/or diagnosis of SARS-CoV-2 by FDA under an Emergency Use Authorization (EUA).  This EUA will remain in effect (meaning this test can be used) for the duration of the COVID-19 declaration under Section 564(b)(1) of the Act, 21 U.S.C. section 360bbb-3(b)(1), unless the authorization is terminated or revoked sooner. Performed at [facility], [place], [place] [address], [place], [place] [address]   Blood Culture (routine x 2)     Status: None (Preliminary result)   Collection Time: 01/24/20  6:59 PM   Specimen: BLOOD  Result Value Ref Range Status   Specimen Description BLOOD LEFT ANTECUBITAL  Final   Special Requests   Final    BOTTLES DRAWN AEROBIC AND ANAEROBIC Blood Culture results may not be optimal due to an inadequate volume of blood received in culture bottles Performed at [facility], [place] [address] [place], [place] [address]    Culture   Final    NO GROWTH 2 DAYS Performed at [place] [address] [place], [place] [address]    Report Status PENDING  Incomplete  Blood Culture (routine x 2)     Status: None (Preliminary result)   Collection Time: 01/24/20  6:59 PM   Specimen: BLOOD  Result Value Ref Range Status   Specimen Description BLOOD RIGHT ANTECUBITAL  Final   Special Requests   Final    BOTTLES DRAWN AEROBIC AND ANAEROBIC Blood Culture results may not be optimal due to an inadequate volume of blood received in culture bottles Performed at [facility], [place] [address], [place], [place] [address]    Culture    Final    NO GROWTH 2 DAYS Performed  at [facility], [place] [address], [place], [place] [address]    Report Status PENDING  Incomplete  MRSA PCR Screening     Status: None   Collection Time: 01/25/20  3:30 PM   Specimen: Nasopharyngeal  Result Value Ref Range Status   MRSA by PCR NEGATIVE NEGATIVE Final    Comment:        The GeneXpert MRSA Assay (FDA approved for NASAL specimens only), is one component of Keylah Darwish comprehensive MRSA colonization surveillance program. It is not intended to diagnose MRSA infection nor to guide or monitor treatment for MRSA infections. Performed at [facility], [place] [address] [place], [place] [address]          Radiology Studies: No results found.      Scheduled Meds: . enoxaparin (LOVENOX) injection  40 mg Subcutaneous QHS  . guaiFENesin  1,200 mg Oral BID  . insulin aspart  0-20 Units Subcutaneous TID WC  . insulin aspart  0-5 Units Subcutaneous QHS  . insulin aspart  15 Units Subcutaneous TID WC  . insulin glargine  43 Units Subcutaneous BID  . levothyroxine  100 mcg Oral Daily  . loratadine  10 mg Oral Daily  . metFORMIN  1,000 mg Oral Q breakfast  . pantoprazole  40 mg Oral Daily  . rosuvastatin  5 mg Oral Daily  . sertraline  100 mg Oral Daily  . sodium chloride flush  3 mL Intravenous Once  . sodium chloride flush  3 mL Intravenous Q12H   Continuous Infusions: . sodium chloride 10 mL/hr at 01/27/20 0700  . ceFEPime (MAXIPIME) IV 2 g (01/27/20 1206)     LOS: 3 days    Time spent: over 30 min    Fayrene Helper, MD [facility]   To contact the attending provider between 7A-7P or the covering provider during after hours 7P-7A, please log into the web site [email or web address] and access using universal [place] password for that web site. If you do not have the password, please call the hospital operator.  01/27/2020, 5:46 PM

## 2020-01-28 LAB — GLUCOSE, CAPILLARY
Glucose-Capillary: 226 mg/dL — ABNORMAL HIGH (ref 70–99)
Glucose-Capillary: 244 mg/dL — ABNORMAL HIGH (ref 70–99)

## 2020-01-28 LAB — COMPREHENSIVE METABOLIC PANEL
ALT: 17 U/L (ref 0–44)
AST: 13 U/L — ABNORMAL LOW (ref 15–41)
Albumin: 2.8 g/dL — ABNORMAL LOW (ref 3.5–5.0)
Alkaline Phosphatase: 57 U/L (ref 38–126)
Anion gap: 8 (ref 5–15)
BUN: 18 mg/dL (ref 6–20)
CO2: 20 mmol/L — ABNORMAL LOW (ref 22–32)
Calcium: 8.2 mg/dL — ABNORMAL LOW (ref 8.9–10.3)
Chloride: 111 mmol/L (ref 98–111)
Creatinine, Ser: 0.7 mg/dL (ref 0.44–1.00)
GFR calc Af Amer: >60 mL/min (ref >60)
GFR calc non Af Amer: >60 mL/min (ref >60)
Glucose, Bld: 188 mg/dL — ABNORMAL HIGH (ref 70–99)
Potassium: 3.7 mmol/L (ref 3.5–5.1)
Sodium: 139 mmol/L (ref 135–145)
Total Bilirubin: 0.5 mg/dL (ref 0.3–1.2)
Total Protein: 6 g/dL — ABNORMAL LOW (ref 6.5–8.1)

## 2020-01-28 LAB — CBC WITH DIFFERENTIAL/PLATELET
Abs Immature Granulocytes: 0.3 10*3/uL — ABNORMAL HIGH (ref 0.00–0.07)
Basophils Absolute: 0 10*3/uL (ref 0.0–0.1)
Basophils Relative: 0 %
Eosinophils Absolute: 0.2 10*3/uL (ref 0.0–0.5)
Eosinophils Relative: 2 %
HCT: 35.5 % — ABNORMAL LOW (ref 36.0–46.0)
Hemoglobin: 11.1 g/dL — ABNORMAL LOW (ref 12.0–15.0)
Immature Granulocytes: 3 %
Lymphocytes Relative: 29 %
Lymphs Abs: 2.7 10*3/uL (ref 0.7–4.0)
MCH: 26.9 pg (ref 26.0–34.0)
MCHC: 31.3 g/dL (ref 30.0–36.0)
MCV: 86.2 fL (ref 80.0–100.0)
Monocytes Absolute: 1.1 10*3/uL — ABNORMAL HIGH (ref 0.1–1.0)
Monocytes Relative: 11 %
Neutro Abs: 5 10*3/uL (ref 1.7–7.7)
Neutrophils Relative %: 55 %
Platelets: 626 10*3/uL — ABNORMAL HIGH (ref 150–400)
RBC: 4.12 MIL/uL (ref 3.87–5.11)
RDW: 12.5 % (ref 11.5–15.5)
WBC: 9.3 10*3/uL (ref 4.0–10.5)
nRBC: 0 % (ref 0.0–0.2)

## 2020-01-28 LAB — URINALYSIS, ROUTINE W REFLEX MICROSCOPIC
Glucose, UA: 50 mg/dL — AB
Hgb urine dipstick: NEGATIVE
Ketones, ur: 5 mg/dL — AB
Nitrite: NEGATIVE
Protein, ur: 30 mg/dL — AB
Specific Gravity, Urine: 1.038 — ABNORMAL HIGH (ref 1.005–1.030)
pH: 5 (ref 5.0–8.0)

## 2020-01-28 LAB — PHOSPHORUS: Phosphorus: 2.7 mg/dL (ref 2.5–4.6)

## 2020-01-28 LAB — STREP PNEUMONIAE URINARY ANTIGEN: Strep Pneumo Urinary Antigen: NEGATIVE

## 2020-01-28 LAB — MAGNESIUM: Magnesium: 2 mg/dL (ref 1.7–2.4)

## 2020-01-28 MED ORDER — HYPROMELLOSE (GONIOSCOPIC) 2.5 % OP SOLN
1.0000 [drp] | Freq: Four times a day (QID) | OPHTHALMIC | 0 refills | Status: DC
Start: 2020-01-28 — End: 2020-03-18

## 2020-01-28 NOTE — Progress Notes (Signed)
Pharmacy Antibiotic Note  Samantha [name] is a 54 y.o. female admitted on 01/24/2020 with pneumonia. Patient was prescribed cefpodoxime and doxycycline PTA for PNA. On admission, patient was started on ceftriaxone + azithromycin IV. MD plans to broaden therapy due to multilobar PNA not presenting as typical CAP.  Pharmacy has been consulted for vancomycin and cefepime dosing.  PCT 0.11 WBC nl Afebrile Vancomycin d/c w/ negative MRSA PCR  Plan: Continue cefepime 2 g iv q 8 hours (D4) Would consider stopping antibiotics at this point since patient has been on antibiotics starting with cefpodoxime and doxycycline on 5/11 with IV antibiotics started on 5/17 then escalated on 5/17 PCT was unremarkable, cultures have remained negative, and patient remains afebrile after 10 days of antibiotics Will sign off on note writing for now   Height: 5\' 6"  (167.6 cm) Weight: 83.5 kg (184 lb) IBW/kg (Calculated) : 59.3  Temp (24hrs), Avg:98.3 F (36.8 C), Min:97.7 F (36.5 C), Max:99.1 F (37.3 C)  Recent Labs  Lab 01/24/20 1746 01/24/20 1746 01/24/20 1859 01/24/20 1859 01/24/20 2025 01/25/20 0434 01/25/20 1733 01/26/20 0513 01/27/20 0805 01/28/20 0541  WBC 7.2  --   --   --   --  6.4  --  11.6* 9.8 9.3  CREATININE 1.06*   < > 0.99   < >  --  1.09* 1.01* 0.87 0.76 0.70  LATICACIDVEN  --   --  1.2  --  1.1  --   --   --   --   --    < > = values in this interval not displayed.    Estimated Creatinine Clearance: 88.6 mL/min (by C-G formula based on SCr of 0.7 mg/dL).    Allergies  Allergen Reactions  . Bupropion Hives  . Lexapro [Escitalopram] Diarrhea and Nausea Only  . Augmentin [Amoxicillin-Pot Clavulanate] Itching  . Cyclobenzaprine Other (See Comments)    Excessive sleepiness  . Pravastatin Other (See Comments)    Leg cramps/ mylagia  . Atorvastatin Other (See Comments)    Leg cramps  . Doxepin Hcl Other (See Comments)    Nightmares     Antimicrobials this  admission: CTX 5/17 x 1 Azithromycin  5/17 x 1 Cefepime 5/17 >> Vancomycin 5/17 x 2  Dose adjustments this admission:   Microbiology results: 5/16 BCx: ngtd 5/17 Sputum: sent 5/17 respiratory panel: sent  5/1 MRSA PCR: negative 5/16: COVID: negative  Thank you for allowing pharmacy to be a part of this patient's care.  Ulice Dash D 01/28/2020 2:20 PM

## 2020-01-28 NOTE — Progress Notes (Signed)
Patient discharges to home with family, discharge instructions reviewed with patient who verbalized understanding.

## 2020-01-28 NOTE — Discharge Summary (Signed)
Physician Discharge Summary  Samantha [name] [id number] DOB: 1965-11-10 DOA: 01/24/2020  PCP: Harlan Stains, MD  Admit date: 01/24/2020 Discharge date: 01/28/2020  Time spent: 40 minutes  Recommendations for Outpatient Follow-up:  1. Follow outpatient CBC/CMP 2. Follow CXR in Samantha [name] few weeks outpatient 3. 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 Eight days prior to admission the patient was seen by her PCP for cough. She tested negative for Covid 19 but had an abnormal CXR with multi-focal infiltrates. Vantin and doxycycline were started. Earlier on the day of admission she went to [facility] for eye pain and was found to have Samantha [name] corneal abrasion OS. She was also short of breath and hypoxemic leading to referral to [facility] for further evaluation. She denies chest pain, neurologic symptoms, N/V. Hemodynamically stable and afebrile. She was hypoxemic with ambulation with O2 sat of 88%. CXR revealed worsening multiple focal hazy infiltrates. WBC normal with normal diff. Inflammatory markers were mildly elevated: LDH 282, ferritin 272, CRP 7.1, lactic acid 1.2 - 1.1, procalcitonin 0.11, D-Dimer 0.68.Marland Kitchen Covid-19 Ag test and PCR were negative. Chemistry revealed serum glucose of 307. TRH is consulted for admission for worsening multi-focal pneumonia.  5/17: Glucose up. Change SSI to resistant level. Check UA.Hold invokana d/t K+. Increase lantus to 40 units BID. Broaden abx to vanc/cefepime. Add  IS , flutter valve.  5/18: MRSA swab is negative. Can drop vanc. Continue nebs, mucinex. IS ordered as well as flutter. Glucose is better today; but fasting sugar is still 235. Will increase lantus today. Has spoken with ophtho. They will see her for right eye pain this afternoon. Appreciate their assistance.  She was admitted for multifocal pneumonia.  She's improved after IV antibiotics.  She was discharged on 5/20 after Samantha [name] course of IV abx with improvement  See below for additional details  Hospital Course:  Multifocal Pneumonia  - ? Partially treated bacterial pneumonia vs viral pneumonia - she's improved with cefepime (s/p5 days) here, will discharge home without abx (she's had 10 days abx including prior to admission abx)     - patient with multi-focal infiltrates on CXR     - CT with multifocal airspace opacity, c/w multifocal pneumonia - No leukocytosis but has been on oral abx for many days.  - Covid 19 negative     - RVP ordered, but not collected -> at this point, I do not think it will change management -> she's improving  - Procalcitonin low.  - RVP pending, negative urine strep     - MRSA negative     - Continue cefepime   DM2  - last A1C 12/30/19 12.5% - follows with Dr. Wardell Heath for endocinology - continue lantus and mealtime, SSI  Hypothyroidism  - continue homelevothyroxine 178mcg  Hyperkalemia - improved, follow  Right eye pain - pt had contact lens in eyes, but did not know -> these were removed     - artificial tears for dry eyes     - needs outpatient follow up for  ocular hypertension     - see note from 5/18  Procedures:  none  Consultations:  ophtho   Discharge Exam: Vitals:   01/28/20 0513 01/28/20 1334  BP: 125/85 128/84  Pulse: 85 88  Resp:  15  Temp: 97.7 F (36.5 C) 98.1 F (36.7 C)  SpO2: 94% 97%   General: No acute distress. Cardiovascular: Heart sounds show Samantha [name] regular rate,  and rhythm. Lungs: Clear to auscultation bilaterally  Abdomen: Soft, nontender, nondistended Neurological: Alert and oriented 3. Moves all extremities 4. Cranial nerves II through XII grossly intact. Skin: Warm and dry. No rashes or lesions. Extremities: No clubbing or cyanosis. No edema.   Discharge Instructions   Discharge Instructions    Call MD for:  difficulty breathing, headache or visual disturbances   Complete by: As directed    Call MD for:  extreme fatigue   Complete by: As directed    Call MD for:  hives   Complete by: As directed    Call MD for:  persistant dizziness or light-headedness   Complete by: As directed    Call MD for:  persistant nausea and vomiting   Complete by: As directed    Call MD for:  redness, tenderness, or signs of infection (pain, swelling, redness, odor or green/yellow discharge around incision site)   Complete by: As directed    Call MD for:  severe uncontrolled pain   Complete by: As directed    Call MD for:  temperature >100.4   Complete by: As directed    Diet - low sodium heart healthy   Complete by: As directed    Discharge instructions   Complete by: As directed    You were seen for pneumonia.  You've improved with antibiotics and have completed Samantha [name] course of antibiotics.    Please follow up with your PCP and follow up an x ray in Samantha [name] few weeks.  You were seen by ophthalmology for you eye pain.  Your contacts were removed.  Continue artificial tears for dry eyes.  You need follow up with the eye doctor as an outpatient to follow up the ocular hypertension.  Please follow up with your PCP as an outpatient.  Return for new, recurrent, or worsening symptoms.  Please ask your PCP to request records from this hospitalization so they know what was done and what the next steps will be.   Increase activity slowly   Complete by: As directed      Allergies as of 01/28/2020      Reactions   Bupropion Hives   Lexapro [escitalopram]  Diarrhea, Nausea Only   Augmentin [amoxicillin-pot Clavulanate] Itching   Cyclobenzaprine Other (See Comments)   Excessive sleepiness   Pravastatin Other (See Comments)   Leg cramps/ mylagia   Atorvastatin Other (See Comments)   Leg cramps   Doxepin Hcl Other (See Comments)   Nightmares      Medication List    STOP taking these medications   cefpodoxime 200 MG tablet Commonly known as: VANTIN   doxycycline 100 MG tablet Commonly known as: VIBRA-TABS     TAKE these medications   BD Pen Needle Nano U/F 32G X 4 MM Misc Generic drug: Insulin Pen Needle USE TO INJECT INSULIN FOUR TIMES DAILY.   cetirizine 10 MG tablet Commonly known as: ZYRTEC Take 1 tablet twice daily as needed What changed:   how much to take  how to take this  when to take this  reasons to take this  famotidine 20 MG tablet Commonly known as: Pepcid Take 1 tablet (20 mg total) by mouth 2 (two) times daily.   Farxiga 10 MG Tabs tablet Generic drug: dapagliflozin propanediol Take 10 mg by mouth daily.   FreeStyle Libre 14 Day Sensor Misc USE TO CHECK BLOOD SUGAR EVERY 14 DAYS, CHANGE EVERY 2 WEEKS   hydroxypropyl methylcellulose / hypromellose 2.5 % ophthalmic solution Commonly known as: ISOPTO TEARS / GONIOVISC Place 1 drop into both eyes 4 (four) times daily.   ibuprofen 800 MG tablet Commonly known as: ADVIL Take 800 mg by mouth every 8 (eight) hours as needed for mild pain or moderate pain.   Lantus SoloStar 100 UNIT/ML Solostar Pen Generic drug: insulin glargine INJECT 40 UNITS SUBCUTANEOUSLY DAILY What changed:   how much to take  how to take this  when to take this  additional instructions   levothyroxine 100 MCG tablet Commonly known as: Euthyrox Take 1 tablet (100 mcg total) by mouth daily.   metFORMIN 500 MG 24 hr tablet Commonly known as: Glucophage XR Take 2 tablets (1,000 mg total) by mouth daily with breakfast.   NovoLOG FlexPen 100 UNIT/ML FlexPen Generic  drug: insulin aspart INJECT 10-25 UNITS SUBCUTANEOUSLY THREE TIMES DAILY WITH MEALS What changed:   how much to take  when to take this  additional instructions   omeprazole 40 MG capsule Commonly known as: PRILOSEC Take 40 mg by mouth daily.   rosuvastatin 5 MG tablet Commonly known as: Crestor Take 1 tablet (5 mg total) by mouth at bedtime. What changed: when to take this   sertraline 100 MG tablet Commonly known as: ZOLOFT Take 100 mg by mouth daily.   VITAMIN D (ERGOCALCIFEROL) PO Take 1 capsule by mouth daily.      Allergies  Allergen Reactions  . Bupropion Hives  . Lexapro [Escitalopram] Diarrhea and Nausea Only  . Augmentin [Amoxicillin-Pot Clavulanate] Itching  . Cyclobenzaprine Other (See Comments)    Excessive sleepiness  . Pravastatin Other (See Comments)    Leg cramps/ mylagia  . Atorvastatin Other (See Comments)    Leg cramps  . Doxepin Hcl Other (See Comments)    Nightmares       The results of significant diagnostics from this hospitalization (including imaging, microbiology, ancillary and laboratory) are listed below for reference.    Significant Diagnostic Studies: DG Chest 2 View  Result Date: 01/24/2020 CLINICAL DATA:  Patient c/o intermittent chest pain and SOB x 1 week. Patient states she was diagnosed with pneumonia 6 days ago. Patient also reports that she tested negative for Covid. EXAM: CHEST - 2 VIEW COMPARISON:  01/18/2020 FINDINGS: Interstitial and hazy airspace opacities are noted in the mid and lower lungs mildly increased the prior study, with the increase most evident in the mid lungs. No pleural effusion or pneumothorax. Cardiac silhouette is normal in size. No mediastinal or hilar masses. No evidence of adenopathy. Skeletal structures are intact. IMPRESSION: 1. Since the prior study, the infiltrates in the lungs mildly increased, now involving the mid lungs as well as the bases, consistent with worsened multifocal pneumonia.  Electronically Signed   By: Lajean Manes M.D.   On: 01/24/2020 17:24   DG Chest 2 View  Result Date: 01/18/2020 CLINICAL DATA:  Shortness of breath and cough for 1-2 weeks EXAM: CHEST - 2 VIEW COMPARISON:  11/07/2013 FINDINGS: Cardiac shadow is within normal limits. The lungs are well aerated bilaterally. Bibasilar infiltrates are noted predominately within the medial aspect of the right middle  lobe and the retrocardiac region of the left lower lobe consistent with multifocal pneumonia. No pneumothorax or pleural effusion is seen. No acute bony abnormality is noted. IMPRESSION: Multifocal pneumonia in the bases bilaterally. Followup PA and lateral chest X-ray is recommended in 3-4 weeks following trial of antibiotic therapy to ensure resolution and exclude underlying malignancy. Electronically Signed   By: Inez Catalina M.D.   On: 01/18/2020 15:18   CT Chest Wo Contrast  Result Date: 01/25/2020 CLINICAL DATA:  Unresolved pneumonia, negative COVID EXAM: CT CHEST WITHOUT CONTRAST TECHNIQUE: Multidetector CT imaging of the chest was performed following the standard protocol without IV contrast. COMPARISON:  Radiograph same day FINDINGS: Cardiovascular: There are no significant vascular findings. The heart size is normal. There is no pericardial thickening or effusion. Mediastinum/Nodes: Scattered pre-vascular and pretracheal lymph nodes are seen. The thyroid gland, trachea and esophagus demonstrate no significant findings. Lungs/Pleura: Multifocal rounded patchy airspace opacities are seen throughout both lungs, predominantly within the periphery at both lung bases. Upper abdomen: The visualized portion of the upper abdomen is unremarkable. Musculoskeletal/Chest wall: There is no chest wall mass or suspicious osseous finding. No acute osseous abnormality IMPRESSION: Multifocal airspace opacity seen throughout both, predominantly at the lung bases, consistent with multifocal pneumonia. Electronically Signed    By: Prudencio Pair M.D.   On: 01/25/2020 00:04    Microbiology: Recent Results (from the past 240 hour(s))  SARS Coronavirus 2 by RT PCR (hospital order, performed in [facility] hospital lab) Nasopharyngeal Nasopharyngeal Swab     Status: None   Collection Time: 01/24/20  6:26 PM   Specimen: Nasopharyngeal Swab  Result Value Ref Range Status   SARS Coronavirus 2 NEGATIVE NEGATIVE Final    Comment: (NOTE) SARS-CoV-2 target nucleic acids are NOT DETECTED. The SARS-CoV-2 RNA is generally detectable in upper and lower respiratory specimens during the acute phase of infection. The lowest concentration of SARS-CoV-2 viral copies this assay can detect is 250 copies / mL. Sonali Wivell negative result does not preclude SARS-CoV-2 infection and should not be used as the sole basis for treatment or other patient management decisions.  Hobie Kohles negative result may occur with improper specimen collection / handling, submission of specimen other than nasopharyngeal swab, presence of viral mutation(s) within the areas targeted by this assay, and inadequate number of viral copies (<250 copies / mL). Yazmyne Sara negative result must be combined with clinical observations, patient history, and epidemiological information. Fact Sheet for Patients:   StrictlyIdeas.no Fact Sheet for Healthcare Providers: BankingDealers.co.za This test is not yet approved or cleared  by the [place] FDA and has been authorized for detection and/or diagnosis of SARS-CoV-2 by FDA under an Emergency Use Authorization (EUA).  This EUA will remain in effect (meaning this test can be used) for the duration of the COVID-19 declaration under Section 564(b)(1) of the Act, 21 U.S.C. section 360bbb-3(b)(1), unless the authorization is terminated or revoked sooner. Performed at [facility], [place] [address] [place], [place] [address]   Blood Culture (routine x 2)     Status: None  (Preliminary result)   Collection Time: 01/24/20  6:59 PM   Specimen: BLOOD  Result Value Ref Range Status   Specimen Description BLOOD LEFT ANTECUBITAL  Final   Special Requests   Final    BOTTLES DRAWN AEROBIC AND ANAEROBIC Blood Culture results may not be optimal due to an inadequate volume of blood received in culture bottles Performed at [facility], [place] [place], [place]  [address]    Culture   Final    NO GROWTH 3 DAYS Performed at [facility], [place] [address] [place], [place] [address]    Report Status PENDING  Incomplete  Blood Culture (routine x 2)     Status: None (Preliminary result)   Collection Time: 01/24/20  6:59 PM   Specimen: BLOOD  Result Value Ref Range Status   Specimen Description BLOOD RIGHT ANTECUBITAL  Final   Special Requests   Final    BOTTLES DRAWN AEROBIC AND ANAEROBIC Blood Culture results may not be optimal due to an inadequate volume of blood received in culture bottles Performed at [facility], [place] [address], [place], [place] [address]    Culture   Final    NO GROWTH 3 DAYS Performed at [facility], [place] [address] [place], [place] [address]    Report Status PENDING  Incomplete  MRSA PCR Screening     Status: None   Collection Time: 01/25/20  3:30 PM   Specimen: Nasopharyngeal  Result Value Ref Range Status   MRSA by PCR NEGATIVE NEGATIVE Final    Comment:        The GeneXpert MRSA Assay (FDA approved for NASAL specimens only), is one component of Des[facility] Blumenfeld comprehensive MRSA colonization surveillance program. It is not intended to diagnose MRSA infection nor to guide or monitor treatment for MRSA infections. Performed at [facility], [place] [address], [place], [place] [address]      Labs: Basic Metabolic Panel: Recent Labs  Lab 01/25/20 0434 01/25/20 1733 01/26/20 0513 01/27/20 0805 01/28/20 0541  NA 136 140 142 142 139  K 5.2* 4.2 3.9 3.6 3.7  CL 99  103 108 109 111  CO2 24 26 24  20* 20*  GLUCOSE 477* 211* 235* 136* 188*  BUN 26* 26* 26* 18 18  CREATININE 1.09* 1.01* 0.87 0.76 0.70  CALCIUM 9.3 9.1 9.0 8.6* 8.2*  MG  --   --  2.2  --  2.0  PHOS  --   --   --   --  2.7   Liver Function Tests: Recent Labs  Lab 01/24/20 1859 01/26/20 0513 01/27/20 0805 01/28/20 0541  AST 15 12* 11* 13*  ALT 20 18 15 17   ALKPHOS 67 60 46 57  BILITOT 0.4 0.8 0.3 0.5  PROT 7.4 6.9 5.4* 6.0*  ALBUMIN 3.2* 3.1* 2.5* 2.8*   No results for input(s): LIPASE, AMYLASE in the last 168 hours. No results for input(s): AMMONIA in the last 168 hours. CBC: Recent Labs  Lab 01/24/20 1746 01/25/20 0434 01/26/20 0513 01/27/20 0805 01/28/20 0541  WBC 7.2 6.4 11.6* 9.8 9.3  NEUTROABS  --   --  7.6  --  5.0  HGB 13.4 12.0 11.0* 11.4* 11.1*  HCT 42.2 37.3 35.0* 35.5* 35.5*  MCV 85.4 85.0 85.6 85.1 86.2  PLT 595* 521* 606* 614* 626*   Cardiac Enzymes: No results for input(s): CKTOTAL, CKMB, CKMBINDEX, TROPONINI in the last 168 hours. BNP: BNP (last 3 results) No results for input(s): BNP in the last 8760 hours.  ProBNP (last 3 results) No results for input(s): PROBNP in the last 8760 hours.  CBG: Recent Labs  Lab 01/27/20 1146 01/27/20 1651 01/27/20 2107 01/28/20 0752 01/28/20 1206  GLUCAP 216* 210* 223* 226* 244*       Signed:  Fayrene Helper MD.  [facility] 01/28/2020, 6:16 PM

## 2020-01-28 NOTE — Evaluation (Signed)
Physical Therapy Evaluation Patient Details Name: Samantha [name] MRN: [id number] DOB: 1966-09-09 Today's Date: 01/28/2020   History of Present Illness  Samantha [name] is a 54 y.o. 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 Eight days prior to admission the patient was seen by her PCP for cough. She tested negative for Covid 19 but had an abnormal CXR with multi-focal infiltrates.  Earlier on the day of admission she went to [facility] for eye pain and was found to have a corneal abrasion OS.Has had contact lenses remooved since admit.  Clinical Impression  The patient is up ad lib in room, no DME needs, no PT recommended. Spo2 post mobilizing in room 93%. Encouraged IS and pursed lip breathing when SOB. Has flutter valve ordered so reqquested from RT. No further PT needs. Plans Dc today.    Follow Up Recommendations No PT follow up    Equipment Recommendations  None recommended by PT    Recommendations for Other Services       Precautions / Restrictions Precautions Precautions: None      Mobility  Bed Mobility Overal bed mobility: Independent                Transfers Overall transfer level: Independent                  Ambulation/Gait Ambulation/Gait assistance: Independent Gait Distance (Feet): 40 Feet Assistive device: IV Pole Gait Pattern/deviations: WFL(Within Functional Limits)   Gait velocity interpretation: >2.62 ft/sec, indicative of community ambulatory    Stairs            Wheelchair Mobility    Modified Rankin (Stroke Patients Only)       Balance Overall balance assessment: No apparent balance deficits (not formally assessed)                                           Pertinent Vitals/Pain Pain Assessment: No/denies pain    Home Living Family/patient expects to be discharged to:: Private residence Living Arrangements: Children Available Help  at Discharge: Family Type of Home: House Home Access: Stairs to enter Entrance Stairs-Rails: Right Entrance Stairs-Number of Steps: 3 Home Layout: One level Home Equipment: None      Prior Function Level of Independence: Independent               Hand Dominance   Dominant Hand: Right    Extremity/Trunk Assessment   Upper Extremity Assessment Upper Extremity Assessment: Overall WFL for tasks assessed    Lower Extremity Assessment Lower Extremity Assessment: Overall WFL for tasks assessed    Cervical / Trunk Assessment Cervical / Trunk Assessment: Normal  Communication   Communication: No difficulties  Cognition Arousal/Alertness: Awake/alert Behavior During Therapy: WFL for tasks assessed/performed Overall Cognitive Status: Within Functional Limits for tasks assessed                                        General Comments      Exercises Other Exercises Other Exercises: pursed lib breaths, IS encouraged-states it is hard and hax=s not performed much   Assessment/Plan    PT Assessment Patent does not need any further PT services  PT Problem List         PT Treatment  Interventions      PT Goals (Current goals can be found in the Care Plan section)  Acute Rehab PT Goals Patient Stated Goal: go home PT Goal Formulation: All assessment and education complete, DC therapy    Frequency     Barriers to discharge        Co-evaluation               AM-PAC PT "6 Clicks" Mobility  Outcome Measure Help needed turning from your back to your side while in a flat bed without using bedrails?: None Help needed moving from lying on your back to sitting on the side of a flat bed without using bedrails?: None Help needed moving to and from a bed to a chair (including a wheelchair)?: None Help needed standing up from a chair using your arms (e.g., wheelchair or bedside chair)?: None Help needed to walk in hospital room?: None Help needed  climbing 3-5 steps with a railing? : A Little 6 Click Score: 23    End of Session   Activity Tolerance: Patient tolerated treatment well Patient left: in chair;with call bell/phone within reach Nurse Communication: Mobility status PT Visit Diagnosis: Difficulty in walking, not elsewhere classified (R26.2)    Time: [id number] PT Time Calculation (min) (ACUTE ONLY): 15 min   Charges:   PT Evaluation $PT Eval Low Complexity: [place] Pager [phone] Office [phone]   Claretha Cooper 01/28/2020, 2:23 PM

## 2020-01-29 ENCOUNTER — Ambulatory Visit (INDEPENDENT_AMBULATORY_CARE_PROVIDER_SITE_OTHER): Payer: 59 | Admitting: Cardiology

## 2020-01-29 ENCOUNTER — Other Ambulatory Visit: Payer: Self-pay

## 2020-01-29 ENCOUNTER — Encounter: Payer: Self-pay | Admitting: Cardiology

## 2020-01-29 VITALS — BP 120/78 | HR 93 | Temp 96.8°F | Ht 66.0 in | Wt 170.0 lb

## 2020-01-29 DIAGNOSIS — R0602 Shortness of breath: Secondary | ICD-10-CM | POA: Diagnosis not present

## 2020-01-29 DIAGNOSIS — R079 Chest pain, unspecified: Secondary | ICD-10-CM | POA: Diagnosis not present

## 2020-01-29 DIAGNOSIS — E785 Hyperlipidemia, unspecified: Secondary | ICD-10-CM

## 2020-01-29 LAB — LEGIONELLA PNEUMOPHILA SEROGP 1 UR AG: L. pneumophila Serogp 1 Ur Ag: NEGATIVE

## 2020-01-29 NOTE — Patient Instructions (Signed)
Medication Instructions:  Your physician recommends that you continue on your current medications as directed. Please refer to the Current Medication list given to you today.  Testing/Procedures: Your physician has requested that you have an echocardiogram. Echocardiography is a painless test that uses sound waves to create images of your heart. It provides your doctor with information about the size and shape of your heart and how well your heart's chambers and valves are working. This procedure takes approximately one hour. There are no restrictions for this procedure.  This will be done at our [facility] location:  [place]: At [employer] [facility], you and your health needs are our priority.  As part of our continuing mission to provide you with exceptional heart care, we have created designated Provider Care Teams.  These Care Teams include your primary Cardiologist (physician) and Advanced Practice Providers (APPs -  Physician Assistants and Nurse Practitioners) who all work together to provide you with the care you need, when you need it.  We recommend signing up for the patient portal called "MyChart".  Sign up information is provided on this After Visit Summary.  MyChart is used to connect with patients for Virtual Visits (Telemedicine).  Patients are able to view lab/test results, encounter notes, upcoming appointments, etc.  Non-urgent messages can be sent to your provider as well.   To learn more about what you can do with MyChart, go to [email or web address].    Your next appointment:   6 week(s)  The format for your next appointment:   In Person  Provider:   Oswaldo Milian, MD

## 2020-01-29 NOTE — Progress Notes (Signed)
Cardiology Office Note:    Date:  01/31/2020   ID:  Samantha [name], DOB 1966/07/04, MRN [id number]  PCP:  Harlan Stains, MD  Cardiologist:  No primary care provider on file.  Electrophysiologist:  None   Referring MD: Lois Huxley, PA   Chief Complaint  Patient presents with  . Chest Pain    History of Present Illness:    Samantha [name] is a 54 y.o. female with a hx of IDDM, hypothyroidism after treatment for Graves' disease, hyperlipidemia who is referred by Marilynne Drivers, PA for evaluation of chest pain.  She reports that her chest pain started a few weeks ago.  Reports that pain was in center of chest and felt like heaviness.  Lasted for days.  Was worse with deep breathing.  She does not exercise.  Reports most exertion is carrying equipment at work.  Has not noted chest pain with exertion.  She was subsequently diagnosed with pneumonia and was admitted to [facility]  from 5/16 through 01/28/2020. She had presented to urgent care with a corneal abrasion and was found to have shortness of breath and hypoxemia. She was sent to [facility] ED had SPO2 down to 88%. Chest x-ray showed multiple focal hazy infiltrates. Labs significant for CRP 7.1, pro calcitonin 0.11, D-dimer 0.68. COVID-19 was negative. She was started on treatment for pneumonia and improved with IV antibiotics. She was discharged to complete a course of oral antibiotics on 5/20.  She reports that since she began treatment for her pneumonia, she continues to have shortness of breath but her chest pain is significantly improved.  No smoking history.  No history of heart disease in her immediate family.  Past Medical History:  Diagnosis Date  . Diabetes mellitus without complication (HCC)    Type 2  . Thyroid disease   . Urticaria     Past Surgical History:  Procedure Laterality Date  . CESAREAN SECTION    . UTERINE FIBROID EMBOLIZATION      Current Medications: Current Meds  Medication Sig  . BD PEN NEEDLE NANO  U/F 32G X 4 MM MISC USE TO INJECT INSULIN FOUR TIMES DAILY.  . cetirizine (ZYRTEC) 10 MG tablet Take 1 tablet twice daily as needed (Patient taking differently: Take 10 mg by mouth daily as needed for allergies. Take 1 tablet twice daily as needed)  . Continuous Blood Gluc Sensor (FREESTYLE LIBRE 14 DAY SENSOR) MISC USE TO CHECK BLOOD SUGAR EVERY 14 DAYS, CHANGE EVERY 2 WEEKS  . dapagliflozin propanediol (FARXIGA) 10 MG TABS tablet Take 10 mg by mouth daily.  . famotidine (PEPCID) 20 MG tablet Take 1 tablet (20 mg total) by mouth 2 (two) times daily.  . hydroxypropyl methylcellulose / hypromellose (ISOPTO TEARS / GONIOVISC) 2.5 % ophthalmic solution Place 1 drop into both eyes 4 (four) times daily.  Marland Kitchen ibuprofen (ADVIL,MOTRIN) 800 MG tablet Take 800 mg by mouth every 8 (eight) hours as needed for mild pain or moderate pain.   Marland Kitchen insulin glargine (LANTUS SOLOSTAR) 100 UNIT/ML Solostar Pen INJECT 40 UNITS SUBCUTANEOUSLY DAILY (Patient taking differently: Inject 30-40 Units into the skin 2 (two) times daily. Inject 30 units subcutaneously every morning and 40 units subcutaneously every evening.)  . levothyroxine (EUTHYROX) 100 MCG tablet Take 1 tablet (100 mcg total) by mouth daily.  . metFORMIN (GLUCOPHAGE XR) 500 MG 24 hr tablet Take 2 tablets (1,000 mg total) by mouth daily with breakfast.  . NOVOLOG FLEXPEN 100 UNIT/ML FlexPen INJECT 10-25 UNITS SUBCUTANEOUSLY  THREE TIMES DAILY WITH MEALS (Patient taking differently: 10-25 Units 3 (three) times daily with meals. )  . omeprazole (PRILOSEC) 40 MG capsule Take 40 mg by mouth daily.   . rosuvastatin (CRESTOR) 5 MG tablet Take 1 tablet (5 mg total) by mouth at bedtime. (Patient taking differently: Take 5 mg by mouth daily. )  . sertraline (ZOLOFT) 100 MG tablet Take 100 mg by mouth daily.  Marland Kitchen VITAMIN D, ERGOCALCIFEROL, PO Take 1 capsule by mouth daily.     Allergies:   Bupropion, Lexapro [escitalopram], Augmentin [amoxicillin-pot clavulanate],  Cyclobenzaprine, Pravastatin, Atorvastatin, and Doxepin hcl   Social History   Socioeconomic History  . Marital status: Single    Spouse name: Not on file  . Number of children: Not on file  . Years of education: Not on file  . Highest education level: Not on file  Occupational History  . Not on file  Tobacco Use  . Smoking status: Never Smoker  . Smokeless tobacco: Never Used  Substance and Sexual Activity  . Alcohol use: No    Alcohol/week: 0.0 standard drinks  . Drug use: Never  . Sexual activity: Not on file  Other Topics Concern  . Not on file  Social History Narrative  . Not on file   Social Determinants of Health   Financial Resource Strain:   . Difficulty of Paying Living Expenses:   Food Insecurity:   . Worried About Charity fundraiser in the Last Year:   . Arboriculturist in the Last Year:   Transportation Needs:   . Film/video editor (Medical):   Marland Kitchen Lack of Transportation (Non-Medical):   Physical Activity:   . Days of Exercise per Week:   . Minutes of Exercise per Session:   Stress:   . Feeling of Stress :   Social Connections:   . Frequency of Communication with Friends and Family:   . Frequency of Social Gatherings with Friends and Family:   . Attends Religious Services:   . Active Member of Clubs or Organizations:   . Attends Archivist Meetings:   Marland Kitchen Marital Status:      Family History: The patient's family history includes Diabetes in an other family member; Stroke in an other family member.  ROS:   Please see the history of present illness.     All other systems reviewed and are negative.  EKGs/Labs/Other Studies Reviewed:    The following studies were reviewed today:   EKG:  EKG is  ordered today.  The ekg ordered today demonstrates normal sinus rhythm, rate 93, nonspecific T wave flattening, no ST abnormalities  Recent Labs: 01/13/2020: TSH 0.38 01/28/2020: ALT 17; BUN 18; Creatinine, Ser 0.70; Hemoglobin 11.1; Magnesium  2.0; Platelets 626; Potassium 3.7; Sodium 139  Recent Lipid Panel    Component Value Date/Time   CHOL 130 12/25/2017 1044   TRIG 248 (H) 01/24/2020 1859   HDL 35.90 (L) 12/25/2017 1044   CHOLHDL 4 12/25/2017 1044   VLDL 17.6 12/25/2017 1044   LDLCALC 77 12/25/2017 1044    Physical Exam:    VS:  BP 120/78   Pulse 93   Temp (!) 96.8 F (36 C)   Ht 5\' 6"  (1.676 m)   Wt 170 lb (77.1 kg)   LMP 07/10/2012   SpO2 95%   BMI 27.44 kg/m     Wt Readings from Last 3 Encounters:  01/29/20 170 lb (77.1 kg)  01/24/20 184 lb (83.5 kg)  12/24/19  195 lb (88.5 kg)     GEN: Well nourished, well developed in no acute distress HEENT: Normal NECK: No JVD; No carotid bruits LYMPHATICS: No lymphadenopathy CARDIAC: RRR, no murmurs, rubs, gallops RESPIRATORY:  Clear to auscultation without rales, wheezing or rhonchi  ABDOMEN: Soft, non-tender, non-distended MUSCULOSKELETAL:  No edema SKIN: Warm and dry NEUROLOGIC:  Alert and oriented x 3 PSYCHIATRIC:  Normal affect   ASSESSMENT:    1. Chest pain of uncertain etiology   2. Shortness of breath   3. Hyperlipidemia, unspecified hyperlipidemia type    PLAN:    Chest pain: Suspect related to recent pneumonia, as describes pleuritic chest pain that corresponded to onset of pneumonia.  Appears to be improving with treatment.  Will continue to monitor.  If chest pain persists with resolution of her pneumonia, will evaluate for ischemic heart disease  Shortness of breath: Likely related to recent pneumonia.  Will check TTE to rule out structural heart disease  Hyperlipidemia: On rosuvastatin 5 mg daily.  Last LDL 77 on 12/25/2017  Type 2 diabetes: Last A1c 12.5% on 12/30/2019.  On insulin.  Follows with endocrinology  RTC in 6 weeks   Medication Adjustments/Labs and Tests Ordered: Current medicines are reviewed at length with the patient today.  Concerns regarding medicines are outlined above.  Orders Placed This Encounter  Procedures  .  EKG 12-Lead  . ECHOCARDIOGRAM COMPLETE   No orders of the defined types were placed in this encounter.   Patient Instructions  Medication Instructions:  Your physician recommends that you continue on your current medications as directed. Please refer to the Current Medication list given to you today.  Testing/Procedures: Your physician has requested that you have an echocardiogram. Echocardiography is a painless test that uses sound waves to create images of your heart. It provides your doctor with information about the size and shape of your heart and how well your heart's chambers and valves are working. This procedure takes approximately one hour. There are no restrictions for this procedure.  This will be done at our [facility] location:  [place]: At [facility], you and your health needs are our priority.  As part of our continuing mission to provide you with exceptional heart care, we have created designated Provider Care Teams.  These Care Teams include your primary Cardiologist (physician) and Advanced Practice Providers (APPs -  Physician Assistants and Nurse Practitioners) who all work together to provide you with the care you need, when you need it.  We recommend signing up for the patient portal called "MyChart".  Sign up information is provided on this After Visit Summary.  MyChart is used to connect with patients for Virtual Visits (Telemedicine).  Patients are able to view lab/test results, encounter notes, upcoming appointments, etc.  Non-urgent messages can be sent to your provider as well.   To learn more about what you can do with MyChart, go to [email or web address].    Your next appointment:   6 week(s)  The format for your next appointment:   In Person  Provider:   Oswaldo Milian, MD       Signed, Donato Heinz, MD  01/31/2020 1:06 PM    [place]

## 2020-01-30 LAB — CULTURE, BLOOD (ROUTINE X 2)
Culture: NO GROWTH
Culture: NO GROWTH

## 2020-02-01 ENCOUNTER — Telehealth: Payer: Self-pay | Admitting: Cardiology

## 2020-02-01 NOTE — Telephone Encounter (Signed)
LMOM to call and schedule echo and 6 weeks follow up.... AF

## 2020-02-01 NOTE — Telephone Encounter (Signed)
Called patient to schedule echo and 6 weeks follow up. No answer/unable to leave voicemail.

## 2020-02-03 ENCOUNTER — Telehealth: Payer: Self-pay | Admitting: Cardiology

## 2020-02-03 NOTE — Telephone Encounter (Signed)
Called patient 02/03/20 to schedule follow up visit for 6 weeks prior to echo. Left voicemail for patient to return call to get appt

## 2020-02-11 ENCOUNTER — Other Ambulatory Visit: Payer: Self-pay | Admitting: Family Medicine

## 2020-02-11 ENCOUNTER — Ambulatory Visit
Admission: RE | Admit: 2020-02-11 | Discharge: 2020-02-11 | Disposition: A | Discharge status: Home / Self Care | Payer: 59 | Source: Ambulatory Visit | Attending: Family Medicine | Admitting: Family Medicine

## 2020-02-11 ENCOUNTER — Other Ambulatory Visit: Payer: Self-pay

## 2020-02-11 DIAGNOSIS — J189 Pneumonia, unspecified organism: Secondary | ICD-10-CM

## 2020-02-17 LAB — HM DIABETES EYE EXAM

## 2020-02-23 ENCOUNTER — Other Ambulatory Visit: Payer: Self-pay

## 2020-02-23 ENCOUNTER — Ambulatory Visit (HOSPITAL_COMMUNITY): Payer: 59 | Attending: Cardiology

## 2020-02-23 DIAGNOSIS — R0602 Shortness of breath: Secondary | ICD-10-CM | POA: Insufficient documentation

## 2020-03-01 ENCOUNTER — Encounter: Payer: Self-pay | Admitting: Physician Assistant

## 2020-03-01 ENCOUNTER — Other Ambulatory Visit: Payer: Self-pay

## 2020-03-01 ENCOUNTER — Ambulatory Visit (INDEPENDENT_AMBULATORY_CARE_PROVIDER_SITE_OTHER): Payer: 59 | Admitting: Physician Assistant

## 2020-03-01 VITALS — BP 130/62 | HR 106 | Temp 97.7°F | Ht 66.0 in | Wt 187.4 lb

## 2020-03-01 DIAGNOSIS — R002 Palpitations: Secondary | ICD-10-CM

## 2020-03-01 DIAGNOSIS — R079 Chest pain, unspecified: Secondary | ICD-10-CM

## 2020-03-01 NOTE — Patient Instructions (Addendum)
Medication Instructions:  Your physician recommends that you continue on your current medications as directed. Please refer to the Current Medication list given to you today.  *If you need a refill on your cardiac medications before your next appointment, please call your pharmacy*   Follow-Up: At [facility], you and your health needs are our priority.  As part of our continuing mission to provide you with exceptional heart care, we have created designated Provider Care Teams.  These Care Teams include your primary Cardiologist (physician) and Advanced Practice Providers (APPs -  Physician Assistants and Nurse Practitioners) who all work together to provide you with the care you need, when you need it.  We recommend signing up for the patient portal called "MyChart".  Sign up information is provided on this After Visit Summary.  MyChart is used to connect with patients for Virtual Visits (Telemedicine).  Patients are able to view lab/test results, encounter notes, upcoming appointments, etc.  Non-urgent messages can be sent to your provider as well.   To learn more about what you can do with MyChart, go to [email or web address].    Your next appointment:   12 month(s)  The format for your next appointment:   In Person  Provider:   You may see Oswaldo Milian, MD or one of the following Advanced Practice Providers on your designated Care Team:    Rosaria Ferries, PA-C  Jory Sims, DNP, ANP  Cadence Kathlen Mody, NP    Other Instructions Please call our office if you experience more or worsening chest tightness or if your palpitations are causing you to feel lightheaded, dizzy, or weak.

## 2020-03-01 NOTE — Progress Notes (Signed)
Cardiology Office Note   Date:  03/01/2020   ID:  Samantha [name], DOB [birth year], MRN [id number]  PCP:  Harlan Stains, MD Cardiologist:  Donato Heinz, MD 01/29/2020 Electrphysiologist: None Rosaria Ferries, PA-C   No chief complaint on file.   History of Present Illness: Samantha [name] is a 53 y.o. female with a history of IDDM, HLD, Graves dz s/p tx >> hypothyroid  05/16-05/20 admission for multifocal PNA, resp failure, corneal abrasion 05/21 Office visit to eval for chest pain, pleuritic in nature, echo and f/u,ischemic eval if no better  Samantha [name] presents for cardiology follow up.  She is gets a fluttering feeling in her chest, that happens with stressful situations or anxiety.   Yesterday, she had a tightness in her chest when she could not find an office. The sx resolved once she found the office.   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 She does not get the chest tightness or any other symptoms with this.  She has no history of exertional chest discomfort  She has purchased an exercise bike, plans to use it.   She does not wake with lower extremity edema, no orthopnea or PND.  No history of presyncope or syncope.  She does feel anxiety at times, she has been on Zoloft at the current dose for a long time, admits that she may need to talk to her PCP about it.   Past Medical History:  Diagnosis Date   Chest pain of uncertain etiology    Diabetes mellitus without complication ([place])    Type 2   Palpitations    Thyroid disease    Urticaria     Past Surgical History:  Procedure Laterality Date   CESAREAN SECTION     UTERINE FIBROID EMBOLIZATION      Current Outpatient Medications  Medication Sig Dispense Refill   BD PEN NEEDLE NANO U/F 32G X 4 MM MISC USE TO INJECT INSULIN FOUR TIMES DAILY. 400 each 11   cetirizine (ZYRTEC) 10 MG tablet Take 1 tablet twice daily as needed 60 tablet 5   Continuous Blood Gluc  Sensor (FREESTYLE LIBRE 14 DAY SENSOR) MISC USE TO CHECK BLOOD SUGAR EVERY 14 DAYS, CHANGE EVERY 2 WEEKS 6 each 3   dapagliflozin propanediol (FARXIGA) 10 MG TABS tablet Take 10 mg by mouth daily. 90 tablet 3   famotidine (PEPCID) 20 MG tablet Take 1 tablet (20 mg total) by mouth 2 (two) times daily. 60 tablet 5   hydroxypropyl methylcellulose / hypromellose (ISOPTO TEARS / GONIOVISC) 2.5 % ophthalmic solution Place 1 drop into both eyes 4 (four) times daily. 15 mL 0   ibuprofen (ADVIL,MOTRIN) 800 MG tablet Take 800 mg by mouth every 8 (eight) hours as needed for mild pain or moderate pain.      insulin glargine (LANTUS SOLOSTAR) 100 UNIT/ML Solostar Pen INJECT 40 UNITS SUBCUTANEOUSLY DAILY 30 mL 3   levothyroxine (EUTHYROX) 100 MCG tablet Take 1 tablet (100 mcg total) by mouth daily. 90 tablet 3   metFORMIN (GLUCOPHAGE XR) 500 MG 24 hr tablet Take 2 tablets (1,000 mg total) by mouth daily with breakfast. 180 tablet 3   NOVOLOG FLEXPEN 100 UNIT/ML FlexPen INJECT 10-25 UNITS SUBCUTANEOUSLY THREE TIMES DAILY WITH MEALS 30 mL 3   omeprazole (PRILOSEC) 40 MG capsule Take 40 mg by mouth daily.      rosuvastatin (CRESTOR) 5 MG tablet Take 1 tablet (5 mg total) by mouth at bedtime. (Patient taking  differently: Take 5 mg by mouth daily. ) 90 tablet 0   sertraline (ZOLOFT) 100 MG tablet Take 100 mg by mouth daily.     VITAMIN D, ERGOCALCIFEROL, PO Take 1 capsule by mouth daily.     No current facility-administered medications for this visit.    Allergies:   Bupropion, Lexapro [escitalopram], Augmentin [amoxicillin-pot clavulanate], Cyclobenzaprine, Pravastatin, Atorvastatin, and Doxepin hcl    Social History:  The patient  reports that she has never smoked. She has never used smokeless tobacco. She reports that she does not drink alcohol and does not use drugs.   Family History:  The patient's family history includes Dementia in her mother; Diabetes in her mother; Stroke in her father.  She  indicated that her mother is deceased. She indicated that her father is deceased.  ROS:  Please see the history of present illness. All other systems are reviewed and negative.    PHYSICAL EXAM: VS:  BP 130/62    Pulse (!) 106    Temp 97.7 F (36.5 C)    Ht 5\' 6"  (1.676 m)    Wt 187 lb 6.4 oz (85 kg)    LMP 07/10/2012    SpO2 95%    BMI 30.25 kg/m  , BMI Body mass index is 30.25 kg/m. GEN: Well nourished, well developed, female in no acute distress HEENT: normal for age  Neck: no JVD, no carotid bruit, no masses Cardiac: RRR; no murmur, no rubs, or gallops Respiratory:  clear to auscultation bilaterally, normal work of breathing GI: soft, nontender, nondistended, + BS MS: no deformity or atrophy; no edema; distal pulses are 2+ in all 4 extremities  Skin: warm and dry, no rash Neuro:  Strength and sensation are intact Psych: euthymic mood, full affect   EKG:  EKG is not ordered today.  ECHO: 02/23/2020 1. Left ventricular ejection fraction, by estimation, is 55 to 60%. The  left ventricle has normal function. The left ventricle has no regional  wall motion abnormalities. Left ventricular diastolic parameters were  normal.  2. Right ventricular systolic function is normal. The right ventricular  size is normal. Tricuspid regurgitation signal is inadequate for assessing  PA pressure.  3. The mitral valve is grossly normal. Trivial mitral valve  regurgitation. No evidence of mitral stenosis.  4. The aortic valve is tricuspid. Aortic valve regurgitation is not  visualized. No aortic stenosis is present.  5. The inferior vena cava is normal in size with greater than 50%  respiratory variability, suggesting right atrial pressure of 3 mmHg.   Recent Labs: 01/13/2020: TSH 0.38 01/28/2020: ALT 17; BUN 18; Creatinine, Ser 0.70; Hemoglobin 11.1; Magnesium 2.0; Platelets 626; Potassium 3.7; Sodium 139  CBC    Component Value Date/Time   WBC 9.3 01/28/2020 0541   RBC 4.12 01/28/2020  0541   HGB 11.1 (L) 01/28/2020 0541   HCT 35.5 (L) 01/28/2020 0541   PLT 626 (H) 01/28/2020 0541   MCV 86.2 01/28/2020 0541   MCH 26.9 01/28/2020 0541   MCHC 31.3 01/28/2020 0541   RDW 12.5 01/28/2020 0541   LYMPHSABS 2.7 01/28/2020 0541   MONOABS 1.1 (H) 01/28/2020 0541   EOSABS 0.2 01/28/2020 0541   BASOSABS 0.0 01/28/2020 0541   CMP Latest Ref Rng & Units 01/28/2020 01/27/2020 01/26/2020  Glucose 70 - 99 mg/dL 188(H) 136(H) 235(H)  BUN 6 - 20 mg/dL 18 18 26(H)  Creatinine 0.44 - 1.00 mg/dL 0.70 0.76 0.87  Sodium 135 - 145 mmol/L 139 142 142  Potassium 3.5 - 5.1 mmol/L 3.7 3.6 3.9  Chloride 98 - 111 mmol/L 111 109 108  CO2 22 - 32 mmol/L 20(L) 20(L) 24  Calcium 8.9 - 10.3 mg/dL 8.2(L) 8.6(L) 9.0  Total Protein 6.5 - 8.1 g/dL 6.0(L) 5.4(L) 6.9  Total Bilirubin 0.3 - 1.2 mg/dL 0.5 0.3 0.8  Alkaline Phos 38 - 126 U/L 57 46 60  AST 15 - 41 U/L 13(L) 11(L) 12(L)  ALT 0 - 44 U/L 17 15 18      Lipid Panel Lab Results  Component Value Date   CHOL 130 12/25/2017   HDL 35.90 (L) 12/25/2017   LDLCALC 77 12/25/2017   TRIG 248 (H) 01/24/2020   CHOLHDL 4 12/25/2017   Lab Results  Component Value Date   TSH 0.38 (A) 01/13/2020      Wt Readings from Last 3 Encounters:  03/01/20 187 lb 6.4 oz (85 kg)  01/29/20 170 lb (77.1 kg)  01/24/20 184 lb (83.5 kg)     Other studies Reviewed: Additional studies/ records that were reviewed today include: Office notes, hospital records and testing.  ASSESSMENT AND PLAN:  1.  Chest pain of uncertain etiology: -She has symptoms with emotional stress, but not physical stress -They resolve in a short period of time was the stress resolves. -She has no history of chest pain with physical exertion -I offered her an ischemic evaluation, but at this time she prefers to hold off -She understands that if she gets symptoms with exertion, when she starts exercising or when she is moving her trunk at work, she is to call us  2.  Palpitations: -She  feels like her heart is skipping or fluttering at times. -The symptoms can be very brief, but can last 15 minutes or more -She has not had any symptoms with this, has not been lightheaded or dizzy and it does not bring on her chest discomfort. -I explained to her that I had a concerned about that, and would like her to wear a monitor -At this time, she prefers not to do any further testing.  She understands that she needs to go to the emergency room or call us if she starts getting lightheaded or dizzy, or if the palpitations make her weak, give her chest pain or any other symptoms.  3.  Hypothyroid: -I mentioned that her TSH was a little low in May of this year. -She has an appointment next month with her doctor, she is encouraged to keep that   Current medicines are reviewed at length with the patient today.  The patient does not have concerns regarding medicines.  The following changes have been made:  no change  Labs/ tests ordered today include:  No orders of the defined types were placed in this encounter.    Disposition:   FU with Donato Heinz, MD  Signed, Rosaria Ferries, PA-C  03/01/2020 9:19 AM    [place] Phone: [phone]; Fax: [phone]

## 2020-03-11 ENCOUNTER — Telehealth: Payer: Self-pay | Admitting: Internal Medicine

## 2020-03-11 MED ORDER — ONETOUCH ULTRASOFT LANCETS MISC: 12 refills | Status: DC

## 2020-03-11 MED ORDER — ONETOUCH VERIO VI STRP: ORAL_STRIP | 12 refills | Status: DC

## 2020-03-11 NOTE — Telephone Encounter (Signed)
Pt has been using the libre sensor to check her BS levels for a year or so but the pt has noticed low reading for the last two weeks with the sensor and is in doubt of the results, she has a one touch verio flex at home but no test strips or lancets to do it manually and would like [place] to call in some for her to check behind the sensor  [facility] on [address], thank you

## 2020-03-11 NOTE — Telephone Encounter (Signed)
Both have been sent. 

## 2020-03-15 ENCOUNTER — Other Ambulatory Visit: Payer: Self-pay

## 2020-03-15 MED ORDER — ONETOUCH DELICA LANCETS 30G MISC: 11 refills | Status: DC

## 2020-03-18 ENCOUNTER — Other Ambulatory Visit: Payer: Self-pay

## 2020-03-18 ENCOUNTER — Encounter: Payer: Self-pay | Admitting: Internal Medicine

## 2020-03-18 ENCOUNTER — Ambulatory Visit (INDEPENDENT_AMBULATORY_CARE_PROVIDER_SITE_OTHER): Payer: 59 | Admitting: Internal Medicine

## 2020-03-18 VITALS — BP 126/74 | HR 115 | Ht 66.0 in | Wt 189.1 lb

## 2020-03-18 DIAGNOSIS — E11311 Type 2 diabetes mellitus with unspecified diabetic retinopathy with macular edema: Secondary | ICD-10-CM | POA: Diagnosis not present

## 2020-03-18 DIAGNOSIS — E890 Postprocedural hypothyroidism: Secondary | ICD-10-CM

## 2020-03-18 DIAGNOSIS — Z794 Long term (current) use of insulin: Secondary | ICD-10-CM | POA: Diagnosis not present

## 2020-03-18 DIAGNOSIS — E782 Mixed hyperlipidemia: Secondary | ICD-10-CM

## 2020-03-18 LAB — POCT GLYCOSYLATED HEMOGLOBIN (HGB A1C): Hemoglobin A1C: 7.7 % — AB (ref 4.0–5.6)

## 2020-03-18 NOTE — Progress Notes (Signed)
Patient ID: Samantha [name], female   DOB: June 19, 1966, 54 y.o.   MRN: [id number]  This visit occurred during the SARS-CoV-2 public health emergency.  Safety protocols were in place, including screening questions prior to the visit, additional usage of staff PPE, and extensive cleaning of exam room while observing appropriate contact time as indicated for disinfecting solutions.   HPI: Samantha [name] is a 54 y.o.-year-old female, returning for follow-up for DM2, dx 2012, insulin-dependent since 2015, uncontrolled, withcomplications (DR) and medication noncompliance. Last OV 3 months ago.  At last visit she was off almost all of her medicines... At last visit, she was telling me she was sleeping during the day and stay up at night. eating snacks.  Since last OV, she had PNA 2/2 Legionella in 01/2020. After this, and especially after she was diagnosed with diabetic retinopathy 02/2020, she stopped Coca Cola (was drinking a 6 pack a day) and reduced sweet tea - now 8 oz. Sugars improved significantly.   Reviewed HbA1c levels: Lab Results  Component Value Date   HGBA1C 12.5 (A) 12/24/2019   HGBA1C 10.3 (A) 06/18/2019   HGBA1C 12.1 (A) 03/16/2019   HGBA1C 10.0 (A) 11/13/2018   HGBA1C 7.4 (A) 03/27/2018   HGBA1C 8.4 12/25/2017   HGBA1C 11.7 04/18/2017   HGBA1C 8.2 08/20/2016   HGBA1C 6.7 02/27/2016   HGBA1C 9.8 11/28/2015   HGBA1C 8.5 08/18/2015   HGBA1C 8.9 (H) 05/19/2015   HGBA1C 11.3 (H) 02/15/2015   HGBA1C 12.2 (H) 09/13/2014   05/27/2014: 12% 02/2014: 9%  At last visit she was supposed to be on a complex medication regimen but she was only taking occasional Lantus:  At that time, we restarted: - Metformin 1000 mg 2x a day with meals - Farxiga 10 mg before b'fast - Novolog 20-25 units before every meal, but 10-15 units before a snack - Lantus 30 units in am and 40 units at night She was on Farxiga x 2 years in the past, but this was stopped b/c weight loss (205 >> 179 lbs),  urinary frequency She tried regular metformin >> GI sxs.  She has a freestyle libre CGM and checks her sugars more than 4 times a day:  Freestyle libre CGM parameters: - Average: 293 >> 136 - % active CGM time: 47% >> 84% of the time - Glucose variability 35.1% >> 39.8% (target < or = to 36%) - time in range:  - very low (<54): 0% >> 5% - low (54-69): 0% >> 5% - normal range (70-180): 10% >> 70% - high sugars (181-250): 33% >> 17% - very high sugars (>250): 57% >> 3% Insulin   Previously:   Previously:   Lowest sugar was 40 (libre) - am >> 70 >> 89 >> 50s; it is unclear at which level she has hypoglycemia unawareness. Highest sugar was 270 >> 400 >> 300s >> 500 >> 300s.  Pt's meals are: - Breakfast: cereal or bisquit - Lunch: hamburger, chinese - Dinner: chicken  - Snacks: 2-3: Frosted flakes! Fruit punch!  We discussed about the importance of stopping these.  She reduced.  -No CKD: Last BUN/creatinine: Lab Results  Component Value Date   BUN 18 01/28/2020   BUN 18 01/27/2020   CREATININE 0.70 01/28/2020   CREATININE 0.76 01/27/2020  02/10/2015: 13/0.83, GFR 86 11/16/2013: 20/0.92, GFR 79  -+ HL; lipid panel: 01/29/2019: 226/265/48/125 Lab Results  Component Value Date   CHOL 130 12/25/2017   HDL 35.90 (L) 12/25/2017   LDLCALC 77 12/25/2017  TRIG 248 (H) 01/24/2020   CHOLHDL 4 12/25/2017  02/10/2015: 185/168/44/107  On Crestor.  - last eye exam was in 02/17/2020: + DR - [facility].  -No numbness and tingling in her feet.  Hypothyroidism after RAI treatment for Graves' disease -History of medication noncompliance  Pt is on levothyroxine 100 mcg daily (decreased from 125 mcg daily, taken: - in am - fasting - at least 30 min from b'fast - no Ca, Fe, MVI, PPIs - not on Biotin  Review TFTs: Lab Results  Component Value Date   TSH 0.38 (A) 01/14/2020   TSH 1.95 06/18/2019   TSH 1.028 06/10/2018   TSH 3.56 03/27/2018   TSH 5.58 (H) 12/25/2017    FREET4 0.76 06/18/2019   FREET4 0.83 03/27/2018   FREET4 0.84 12/25/2017   FREET4 0.61 02/27/2016   FREET4 0.71 05/19/2015  07/28/2013: TSH 1.69  Pt denies: - feeling nodules in neck - hoarseness - dysphagia - choking - SOB with lying down  ROS Constitutional: no weight gain/no weight loss, no fatigue, no subjective hyperthermia, no subjective hypothermia Eyes: no blurry vision, no xerophthalmia ENT: no sore throat, + see HPI Cardiovascular: no CP/no SOB/no palpitations/no leg swelling Respiratory: no cough/no SOB/no wheezing Gastrointestinal: no N/no V/no D/no C/no acid reflux Musculoskeletal: no muscle aches/no joint aches Skin: no rashes, no hair loss Neurological: no tremors/no numbness/no tingling/no dizziness  I reviewed pt's medications, allergies, PMH, social hx, family hx, and changes were documented in the history of present illness. Otherwise, unchanged from my initial visit note.  Past Medical History:  Diagnosis Date  . Chest pain of uncertain etiology   . Diabetes mellitus without complication (HCC)    Type 2  . Palpitations   . Thyroid disease   . Urticaria    Past Surgical History:  Procedure Laterality Date  . CESAREAN SECTION    . UTERINE FIBROID EMBOLIZATION     Social History   Socioeconomic History  . Marital status: Single    Spouse name: Not on file  . Number of children: Not on file  . Years of education: Not on file  . Highest education level: Not on file  Occupational History  . Not on file  Tobacco Use  . Smoking status: Never Smoker  . Smokeless tobacco: Never Used  Vaping Use  . Vaping Use: Never used  Substance and Sexual Activity  . Alcohol use: No    Alcohol/week: 0.0 standard drinks  . Drug use: Never  . Sexual activity: Not on file  Other Topics Concern  . Not on file  Social History Narrative  . Not on file   Social Determinants of Health   Financial Resource Strain:   . Difficulty of Paying Living Expenses:    Food Insecurity:   . Worried About Charity fundraiser in the Last Year:   . Arboriculturist in the Last Year:   Transportation Needs:   . Film/video editor (Medical):   Marland Kitchen Lack of Transportation (Non-Medical):   Physical Activity:   . Days of Exercise per Week:   . Minutes of Exercise per Session:   Stress:   . Feeling of Stress :   Social Connections:   . Frequency of Communication with Friends and Family:   . Frequency of Social Gatherings with Friends and Family:   . Attends Religious Services:   . Active Member of Clubs or Organizations:   . Attends Archivist Meetings:   Marland Kitchen Marital Status:  Intimate Partner Violence:   . Fear of Current or Ex-Partner:   . Emotionally Abused:   Marland Kitchen Physically Abused:   . Sexually Abused:    Current Outpatient Medications on File Prior to Visit  Medication Sig Dispense Refill  . BD PEN NEEDLE NANO U/F 32G X 4 MM MISC USE TO INJECT INSULIN FOUR TIMES DAILY. 400 each 11  . cetirizine (ZYRTEC) 10 MG tablet Take 1 tablet twice daily as needed 60 tablet 5  . Continuous Blood Gluc Sensor (FREESTYLE LIBRE 14 DAY SENSOR) MISC USE TO CHECK BLOOD SUGAR EVERY 14 DAYS, CHANGE EVERY 2 WEEKS 6 each 3  . dapagliflozin propanediol (FARXIGA) 10 MG TABS tablet Take 10 mg by mouth daily. 90 tablet 3  . famotidine (PEPCID) 20 MG tablet Take 1 tablet (20 mg total) by mouth 2 (two) times daily. 60 tablet 5  . glucose blood (ONETOUCH VERIO) test strip Use as instructed to check blood sugar daily 100 each 12  . hydroxypropyl methylcellulose / hypromellose (ISOPTO TEARS / GONIOVISC) 2.5 % ophthalmic solution Place 1 drop into both eyes 4 (four) times daily. 15 mL 0  . ibuprofen (ADVIL,MOTRIN) 800 MG tablet Take 800 mg by mouth every 8 (eight) hours as needed for mild pain or moderate pain.     Marland Kitchen insulin glargine (LANTUS SOLOSTAR) 100 UNIT/ML Solostar Pen INJECT 40 UNITS SUBCUTANEOUSLY DAILY 30 mL 3  . levothyroxine (EUTHYROX) 100 MCG tablet Take 1 tablet  (100 mcg total) by mouth daily. 90 tablet 3  . metFORMIN (GLUCOPHAGE XR) 500 MG 24 hr tablet Take 2 tablets (1,000 mg total) by mouth daily with breakfast. 180 tablet 3  . NOVOLOG FLEXPEN 100 UNIT/ML FlexPen INJECT 10-25 UNITS SUBCUTANEOUSLY THREE TIMES DAILY WITH MEALS 30 mL 3  . omeprazole (PRILOSEC) 40 MG capsule Take 40 mg by mouth daily.     Glory Rosebush Delica Lancets 01X MISC Use to check blood sugar daily 100 each 11  . rosuvastatin (CRESTOR) 5 MG tablet Take 1 tablet (5 mg total) by mouth at bedtime. (Patient taking differently: Take 5 mg by mouth daily. ) 90 tablet 0  . sertraline (ZOLOFT) 100 MG tablet Take 100 mg by mouth daily.    Marland Kitchen VITAMIN D, ERGOCALCIFEROL, PO Take 1 capsule by mouth daily.     No current facility-administered medications on file prior to visit.   Allergies  Allergen Reactions  . Bupropion Hives  . Lexapro [Escitalopram] Diarrhea and Nausea Only  . Augmentin [Amoxicillin-Pot Clavulanate] Itching  . Cyclobenzaprine Other (See Comments)    Excessive sleepiness  . Pravastatin Other (See Comments)    Leg cramps/ mylagia  . Atorvastatin Other (See Comments)    Leg cramps  . Doxepin Hcl Other (See Comments)    Nightmares    Family History  Problem Relation Age of Onset  . Dementia Mother   . Diabetes Mother   . Stroke Father     PE: BP 126/74 (BP Location: Left Arm, Patient Position: Sitting, Cuff Size: Small)   Pulse (!) 115   Ht 5\' 6"  (1.676 m)   Wt 189 lb 2 oz (85.8 kg)   LMP 07/10/2012   BMI 30.53 kg/m  Body mass index is 30.53 kg/m.  Wt Readings from Last 3 Encounters:  03/18/20 189 lb 2 oz (85.8 kg)  03/01/20 187 lb 6.4 oz (85 kg)  01/29/20 170 lb (77.1 kg)   Constitutional: overweight, in NAD Eyes: PERRLA, EOMI, no exophthalmos ENT: moist mucous membranes, no thyromegaly, no cervical  lymphadenopathy Cardiovascular: tachycardia, RR, No MRG Respiratory: CTA B Gastrointestinal: abdomen soft, NT, ND, BS+ Musculoskeletal: no deformities,  strength intact in all 4 Skin: moist, warm, no rashes Neurological: no tremor with outstretched hands, DTR normal in all 4  ASSESSMENT: 1. DM2, insulin-dependent, uncontrolled, with complications - DR  2. Hypothyroidism - postablative for Graves ds.  3. HL  PLAN:  1.  Patient with history of uncontrolled type 2 diabetes, on basal/bolus insulin regimen, and also on Metformin and SGLT2 inhibitor.  At last visit sugars are very high at all times of the day as she was sleeping during the day, eating dinner around 8 PM and then staying up all night eating chips, movies, drinking sweet things.  I advised her at that time that we could not control her diabetes and she continues to describe this.  HbA1c was very high, at 12.5%.  Other than advising her to stop sodas, sweet tea, and snacks, we did not change her regimen.  I also advised her to take NovoLog before a snack, if she does have a snack. -At this visit, we reviewed together her CGM downloads: CGM interpretation: -Marked improvement in blood sugars compared to last visit: Her average is now 136 compared to 293 at last visit.  There is a pattern of decreased blood sugars at night (I advised her to reduce the Lantus dose in the evening) and increased blood sugars after breakfast and especially after dinner (in the 200-260 range).  Upon questioning, she is still having sweet tea with dinner, which I strongly advised her to stop.  If she does stop this, I suspect that her sugars will improve further.  However, overall, since she stopped Coca-Cola, there is a drastic improvement in sugars overall (70% of her blood sugars are now in target range compared to only 10% at last visit !) so I would not suggest to change the rest of her regimen.  I did advise her that if she sees lower blood sugars especially after meals, she will need a lower dose of NovoLog. - I suggested to:  Patient Instructions  Please continue: - Metformin 1000 mg 2x a day with  meals - Farxiga 10 mg before b'fast - Novolog 20-25 units before every meal, but 10-15 units before a snack - Lantus 30 units in am and 40 units at night  STOP SODAS and SWEET TEA! STOP SNACKS!  Please return in 3 months with your sugar log.   - we checked her HbA1c: 7.7% (much lower) - advised to check sugars at different times of the day - 4x a day, rotating check times - advised for yearly eye exams >> she is UTD - return to clinic in 3 months   2. Hypothyroidism - latest thyroid labs reviewed with pt >> slightly low: Lab Results  Component Value Date   TSH 0.38 (A) 01/14/2020   - she continues on LT4 100 mcg daily - pt feels good on this dose. - we discussed about taking the thyroid hormone every day, with water, >30 minutes before breakfast, separated by >4 hours from acid reflux medications, calcium, iron, multivitamins. Pt. is taking it correctly. - will check thyroid tests today: TSH and fT4 - If labs are abnormal, she will need to return for repeat TFTs in 1.5 months  3. HL  - Reviewed latest lipid panel from 01/2019: LDL above triglycerides. -At last visit we again discussed about improving diet, cutting out sweet drinks and processed snacks. - Continues Crestor without side  effects.   Philemon Kingdom, MD PhD [facility] Endocrinology

## 2020-03-18 NOTE — Patient Instructions (Addendum)
STOP SWEET TEA!  Please continue: - Metformin 1000 mg 2x a day with meals - Farxiga 10 mg before b'fast - Novolog 20-25 units before every meal, but 10-15 units before a snack  Please change: - Lantus 30 units in am and 30 units at night  Please come back for a follow-up appointment in 3 months.

## 2020-06-29 ENCOUNTER — Ambulatory Visit: Payer: 59 | Admitting: Internal Medicine

## 2020-08-15 ENCOUNTER — Telehealth: Payer: Self-pay | Admitting: *Deleted

## 2020-08-15 MED ORDER — LANTUS SOLOSTAR 100 UNIT/ML ~~LOC~~ SOPN: PEN_INJECTOR | SUBCUTANEOUS | 0 refills | Status: DC

## 2020-08-15 NOTE — Telephone Encounter (Signed)
Lantus prescription sent to [facility].  Patient aware.

## 2020-08-15 NOTE — Telephone Encounter (Signed)
Patient called requesting her prescriptions to go to [facility]. Patient has questions about a prescription that the pharmacy does not have but want to change it to what they do have

## 2020-08-15 NOTE — Addendum Note (Signed)
Addended by: Amado Coe on: 08/15/2020 03:00 PM   Modules accepted: Orders

## 2020-08-16 ENCOUNTER — Other Ambulatory Visit: Payer: Self-pay | Admitting: Internal Medicine

## 2020-08-16 ENCOUNTER — Other Ambulatory Visit: Payer: Self-pay

## 2020-08-16 ENCOUNTER — Telehealth: Payer: Self-pay | Admitting: Internal Medicine

## 2020-08-16 MED ORDER — BASAGLAR KWIKPEN 100 UNIT/ML ~~LOC~~ SOPN: PEN_INJECTOR | SUBCUTANEOUS | 1 refills | Status: DC

## 2020-08-16 MED FILL — BASAGLAR 100 UNIT/ML KWIKPE: 100 | 37 days supply | Qty: 30 | Fill #0

## 2020-08-16 NOTE — Telephone Encounter (Signed)
[facility] pharmacy called to request Free Style Lite meter, test strips, lancets be sent.  This is what patient's insurance is covering at this time.

## 2020-08-16 NOTE — Telephone Encounter (Signed)
Inbound fax from [facility] requesting to change previously prescribed Lantus Solostar 100 untis/ML to [facility]. Reason: lantus is not covered on [employer] insurance.

## 2020-08-19 ENCOUNTER — Other Ambulatory Visit: Payer: Self-pay | Admitting: Internal Medicine

## 2020-08-19 ENCOUNTER — Other Ambulatory Visit: Payer: Self-pay

## 2020-08-19 MED ORDER — FREESTYLE LANCETS MISC
12 refills | Status: DC
Start: 2020-08-19 — End: 2020-08-19

## 2020-08-19 MED ORDER — FREESTYLE LITE W/DEVICE KIT: 1.0000 | PACK | Freq: Four times a day (QID) | 0 refills | Status: DC

## 2020-08-19 MED ORDER — FREESTYLE TEST VI STRP
ORAL_STRIP | 12 refills | Status: DC
Start: 2020-08-19 — End: 2020-08-19

## 2020-08-19 MED FILL — FREESTYLE LANCETS: 25 days supply | Qty: 100 | Fill #0

## 2020-08-19 MED FILL — FREESTYLE LITE METER: W/DEVICE | 30 days supply | Qty: 1 | Fill #0

## 2020-08-19 MED FILL — FREESTYLE LITE TEST STRIP: 25 days supply | Qty: 100 | Fill #0

## 2020-09-20 ENCOUNTER — Other Ambulatory Visit: Payer: Self-pay | Admitting: Internal Medicine

## 2020-09-20 ENCOUNTER — Telehealth: Payer: Self-pay | Admitting: Internal Medicine

## 2020-09-20 MED ORDER — HUMALOG KWIKPEN 200 UNIT/ML ~~LOC~~ SOPN: PEN_INJECTOR | SUBCUTANEOUS | 3 refills | Status: DC

## 2020-09-20 NOTE — Telephone Encounter (Signed)
Patient called to advise that insurance is not covering Novolog Flex Pen but is covering Humalog Flex Pens.  RX needs to be updated and sent to [facility] for patient

## 2020-09-20 NOTE — Telephone Encounter (Signed)
Sent. C 

## 2020-09-20 NOTE — Telephone Encounter (Signed)
Patient have been notified.  

## 2020-09-20 NOTE — Telephone Encounter (Signed)
Please advise 

## 2020-09-21 MED FILL — HUMALOG 200 UNITS/ML KWIKPE: 200 | 28 days supply | Qty: 12 | Fill #0

## 2020-10-01 MED FILL — BASAGLAR 100 UNIT/ML KWIKPE: 100 | 37 days supply | Qty: 30 | Fill #1

## 2020-10-20 ENCOUNTER — Other Ambulatory Visit: Payer: Self-pay | Admitting: Family Medicine

## 2020-10-20 DIAGNOSIS — Z1231 Encounter for screening mammogram for malignant neoplasm of breast: Secondary | ICD-10-CM

## 2020-10-20 MED FILL — HUMALOG 200 UNITS/ML KWIKPE: 200 | 28 days supply | Qty: 12 | Fill #1

## 2020-10-20 MED FILL — FREESTYLE LITE TEST STRIP: 25 days supply | Qty: 100 | Fill #1

## 2020-10-20 MED FILL — FREESTYLE LANCETS: 25 days supply | Qty: 100 | Fill #1

## 2020-10-25 ENCOUNTER — Other Ambulatory Visit (HOSPITAL_COMMUNITY): Payer: Self-pay | Admitting: Family Medicine

## 2020-10-25 DIAGNOSIS — Z23 Encounter for immunization: Secondary | ICD-10-CM | POA: Diagnosis not present

## 2020-10-25 DIAGNOSIS — K219 Gastro-esophageal reflux disease without esophagitis: Secondary | ICD-10-CM | POA: Diagnosis not present

## 2020-10-25 DIAGNOSIS — R059 Cough, unspecified: Secondary | ICD-10-CM | POA: Diagnosis not present

## 2020-10-25 DIAGNOSIS — J309 Allergic rhinitis, unspecified: Secondary | ICD-10-CM | POA: Diagnosis not present

## 2020-10-26 MED FILL — FLUTICASONE PROP 50 MCG SPR: 50 | 90 days supply | Qty: 48 | Fill #0

## 2020-11-08 MED FILL — LEVOTHYROXINE SODIUM 100 MC: 100 | 90 days supply | Qty: 90 | Fill #0

## 2020-11-15 ENCOUNTER — Other Ambulatory Visit: Payer: Self-pay | Admitting: Internal Medicine

## 2020-11-15 ENCOUNTER — Other Ambulatory Visit (HOSPITAL_COMMUNITY): Payer: Self-pay | Admitting: Internal Medicine

## 2020-11-15 MED FILL — BASAGLAR 100 UNIT/ML KWIKPE: 100 | 37 days supply | Qty: 30 | Fill #0

## 2020-11-16 DIAGNOSIS — H524 Presbyopia: Secondary | ICD-10-CM | POA: Diagnosis not present

## 2020-11-16 DIAGNOSIS — H5213 Myopia, bilateral: Secondary | ICD-10-CM | POA: Diagnosis not present

## 2020-12-02 MED FILL — FARXIGA 10 MG TABLET: 10 | 90 days supply | Qty: 90 | Fill #0

## 2020-12-09 ENCOUNTER — Ambulatory Visit
Admission: RE | Admit: 2020-12-09 | Discharge: 2020-12-09 | Disposition: A | Discharge status: Home / Self Care | Payer: 59 | Source: Ambulatory Visit | Attending: Family Medicine | Admitting: Family Medicine

## 2020-12-09 ENCOUNTER — Other Ambulatory Visit: Payer: Self-pay

## 2020-12-09 DIAGNOSIS — Z1231 Encounter for screening mammogram for malignant neoplasm of breast: Secondary | ICD-10-CM

## 2020-12-15 ENCOUNTER — Other Ambulatory Visit (HOSPITAL_COMMUNITY): Payer: Self-pay

## 2020-12-15 DIAGNOSIS — J029 Acute pharyngitis, unspecified: Secondary | ICD-10-CM | POA: Diagnosis not present

## 2020-12-15 MED ORDER — TRIAMCINOLONE ACETONIDE 55 MCG/ACT NA AERO
2.0000 | INHALATION_SPRAY | Freq: Every day | NASAL | 12 refills | Status: DC
  Filled 2020-12-20: qty 1, 30d supply, fill #0

## 2020-12-15 MED ORDER — OMEPRAZOLE 40 MG PO CPDR
40.0000 mg | DELAYED_RELEASE_CAPSULE | Freq: Two times a day (BID) | ORAL | 1 refills | Status: DC | PRN
  Filled 2020-12-15 – 2020-12-16 (×2): qty 180, 90d supply, fill #0

## 2020-12-16 ENCOUNTER — Other Ambulatory Visit (HOSPITAL_COMMUNITY): Payer: Self-pay

## 2020-12-17 ENCOUNTER — Other Ambulatory Visit (HOSPITAL_COMMUNITY): Payer: Self-pay

## 2020-12-20 ENCOUNTER — Other Ambulatory Visit (HOSPITAL_COMMUNITY): Payer: Self-pay

## 2020-12-22 ENCOUNTER — Other Ambulatory Visit: Payer: Self-pay | Admitting: Internal Medicine

## 2020-12-24 ENCOUNTER — Other Ambulatory Visit (HOSPITAL_COMMUNITY): Payer: Self-pay

## 2020-12-26 ENCOUNTER — Other Ambulatory Visit (HOSPITAL_COMMUNITY): Payer: Self-pay

## 2020-12-26 ENCOUNTER — Other Ambulatory Visit: Payer: Self-pay | Admitting: Internal Medicine

## 2020-12-26 MED FILL — Insulin Lispro Soln Pen-injector 200 Unit/ML: SUBCUTANEOUS | 28 days supply | Qty: 12 | Fill #0 | Status: AC

## 2020-12-27 ENCOUNTER — Telehealth: Payer: Self-pay | Admitting: Internal Medicine

## 2020-12-27 ENCOUNTER — Other Ambulatory Visit (HOSPITAL_COMMUNITY): Payer: Self-pay

## 2020-12-27 NOTE — Telephone Encounter (Signed)
MEDICATION: Insulin Pen Needle 32G X 4 MM MISC  PHARMACY:   [facility] Phone:  [phone]  Fax:  [phone]       HAS THE PATIENT CONTACTED THEIR PHARMACY? Yes   IS THIS A 90 DAY SUPPLY : If possible  IS PATIENT OUT OF MEDICATION: Yes  IF NOT; HOW MUCH IS LEFT: 0  LAST APPOINTMENT DATE: @4 /18/2022  NEXT APPOINTMENT DATE:@5 /01/2021  DO WE HAVE YOUR PERMISSION TO LEAVE A DETAILED MESSAGE?:Yes  OTHER COMMENTS:    **Let patient know to contact pharmacy at the end of the day to make sure medication is ready. **  ** Please notify patient to allow 48-72 hours to process**  **Encourage patient to contact the pharmacy for refills or they can request refills through [facility]**

## 2020-12-28 ENCOUNTER — Other Ambulatory Visit (HOSPITAL_COMMUNITY): Payer: Self-pay

## 2020-12-28 ENCOUNTER — Other Ambulatory Visit: Payer: Self-pay

## 2020-12-28 DIAGNOSIS — F419 Anxiety disorder, unspecified: Secondary | ICD-10-CM | POA: Diagnosis not present

## 2020-12-28 DIAGNOSIS — E785 Hyperlipidemia, unspecified: Secondary | ICD-10-CM | POA: Diagnosis not present

## 2020-12-28 DIAGNOSIS — R350 Frequency of micturition: Secondary | ICD-10-CM | POA: Diagnosis not present

## 2020-12-28 DIAGNOSIS — E039 Hypothyroidism, unspecified: Secondary | ICD-10-CM | POA: Diagnosis not present

## 2020-12-28 DIAGNOSIS — E1121 Type 2 diabetes mellitus with diabetic nephropathy: Secondary | ICD-10-CM | POA: Diagnosis not present

## 2020-12-28 DIAGNOSIS — E559 Vitamin D deficiency, unspecified: Secondary | ICD-10-CM | POA: Diagnosis not present

## 2020-12-28 DIAGNOSIS — K760 Fatty (change of) liver, not elsewhere classified: Secondary | ICD-10-CM | POA: Diagnosis not present

## 2020-12-28 DIAGNOSIS — F324 Major depressive disorder, single episode, in partial remission: Secondary | ICD-10-CM | POA: Diagnosis not present

## 2020-12-28 DIAGNOSIS — Z0000 Encounter for general adult medical examination without abnormal findings: Secondary | ICD-10-CM | POA: Diagnosis not present

## 2020-12-28 MED ORDER — SERTRALINE HCL 100 MG PO TABS
100.0000 mg | ORAL_TABLET | Freq: Every day | ORAL | 1 refills | Status: DC
  Filled 2020-12-28: qty 90, 90d supply, fill #0
  Filled 2021-05-30: qty 90, 90d supply, fill #1

## 2020-12-28 MED ORDER — ROSUVASTATIN CALCIUM 5 MG PO TABS
5.0000 mg | ORAL_TABLET | Freq: Every day | ORAL | 3 refills | Status: DC
  Filled 2020-12-28: qty 90, 90d supply, fill #0
  Filled 2021-07-09: qty 90, 90d supply, fill #1
  Filled 2021-11-21: qty 90, 90d supply, fill #2

## 2020-12-28 MED ORDER — INSULIN PEN NEEDLE 32G X 4 MM MISC: 0 refills | Status: AC

## 2020-12-28 NOTE — Telephone Encounter (Signed)
Rx sent 

## 2020-12-29 ENCOUNTER — Other Ambulatory Visit (HOSPITAL_COMMUNITY): Payer: Self-pay

## 2020-12-30 ENCOUNTER — Other Ambulatory Visit (HOSPITAL_COMMUNITY): Payer: Self-pay

## 2021-01-02 ENCOUNTER — Telehealth: Payer: Self-pay | Admitting: Internal Medicine

## 2021-01-02 NOTE — Telephone Encounter (Signed)
Pt has an app on 01/12/2021 pt is calling regarding needing a refill of  metFORMIN (GLUCOPHAGE XR) 500 MG 24 hr tablet [[id number]]  To last her till her app on May the 5th.   [place]

## 2021-01-12 ENCOUNTER — Other Ambulatory Visit: Payer: Self-pay

## 2021-01-12 ENCOUNTER — Ambulatory Visit: Payer: 59 | Admitting: Internal Medicine

## 2021-01-12 ENCOUNTER — Encounter: Payer: Self-pay | Admitting: Internal Medicine

## 2021-01-12 ENCOUNTER — Other Ambulatory Visit (HOSPITAL_COMMUNITY): Payer: Self-pay

## 2021-01-12 VITALS — BP 128/82 | HR 87 | Ht 66.0 in | Wt 196.4 lb

## 2021-01-12 DIAGNOSIS — E782 Mixed hyperlipidemia: Secondary | ICD-10-CM

## 2021-01-12 DIAGNOSIS — Z794 Long term (current) use of insulin: Secondary | ICD-10-CM

## 2021-01-12 DIAGNOSIS — E890 Postprocedural hypothyroidism: Secondary | ICD-10-CM | POA: Diagnosis not present

## 2021-01-12 DIAGNOSIS — E11311 Type 2 diabetes mellitus with unspecified diabetic retinopathy with macular edema: Secondary | ICD-10-CM | POA: Diagnosis not present

## 2021-01-12 LAB — POCT GLYCOSYLATED HEMOGLOBIN (HGB A1C): Hemoglobin A1C: 11.9 % — AB (ref 4.0–5.6)

## 2021-01-12 LAB — TSH: TSH: 3.75 u[IU]/mL (ref 0.35–4.50)

## 2021-01-12 LAB — T4, FREE: Free T4: 0.81 ng/dL (ref 0.60–1.60)

## 2021-01-12 MED ORDER — DEXCOM G6 RECEIVER DEVI
1.0000 | Freq: Once | 0 refills | Status: AC
Start: 2021-01-12 — End: 2021-01-12
  Filled 2021-01-12: qty 1, 30d supply, fill #0

## 2021-01-12 MED ORDER — BASAGLAR KWIKPEN 100 UNIT/ML ~~LOC~~ SOPN
40.0000 [IU] | PEN_INJECTOR | Freq: Two times a day (BID) | SUBCUTANEOUS | 3 refills | Status: DC
  Filled 2021-01-12 (×2): qty 45, 56d supply, fill #0

## 2021-01-12 MED ORDER — UNIFINE PENTIPS 32G X 4 MM MISC
3 refills | Status: DC
  Filled 2021-01-12: qty 400, 80d supply, fill #0
  Filled 2021-05-20: qty 400, 80d supply, fill #1
  Filled 2021-12-20: qty 400, 80d supply, fill #2

## 2021-01-12 MED ORDER — METFORMIN HCL ER 500 MG PO TB24
1000.0000 mg | ORAL_TABLET | Freq: Two times a day (BID) | ORAL | 3 refills | Status: DC
  Filled 2021-01-12: qty 360, 90d supply, fill #0
  Filled 2021-05-20: qty 360, 90d supply, fill #1

## 2021-01-12 MED ORDER — DEXCOM G6 TRANSMITTER MISC
1.0000 | 3 refills | Status: DC
  Filled 2021-01-12: qty 1, 30d supply, fill #0
  Filled 2021-01-18: qty 1, 90d supply, fill #0
  Filled 2021-05-03: qty 1, 90d supply, fill #1
  Filled 2021-09-12: qty 1, 90d supply, fill #2
  Filled 2021-12-18: qty 1, 90d supply, fill #3

## 2021-01-12 MED ORDER — OZEMPIC (0.25 OR 0.5 MG/DOSE) 2 MG/1.5ML ~~LOC~~ SOPN
0.5000 mg | PEN_INJECTOR | SUBCUTANEOUS | 3 refills | Status: DC
  Filled 2021-01-12: qty 4.5, 84d supply, fill #0
  Filled 2021-04-05: qty 4.5, 84d supply, fill #1
  Filled 2021-04-11: qty 1.5, 28d supply, fill #1
  Filled 2021-05-03: qty 1.5, 28d supply, fill #2
  Filled 2021-06-05: qty 1.5, 28d supply, fill #3
  Filled 2021-07-04: qty 1.5, 28d supply, fill #4
  Filled 2021-08-02: qty 1.5, 28d supply, fill #5
  Filled 2021-09-12: qty 1.5, 28d supply, fill #6
  Filled 2021-10-04: qty 1.5, 28d supply, fill #7
  Filled 2021-11-05: qty 1.5, 28d supply, fill #8

## 2021-01-12 MED ORDER — DEXCOM G6 SENSOR MISC
1.0000 | 3 refills | Status: DC
Start: 2021-01-12 — End: 2021-11-09
  Filled 2021-01-12: qty 9, 90d supply, fill #0
  Filled 2021-01-18: qty 3, 30d supply, fill #0
  Filled 2021-03-17: qty 3, 30d supply, fill #1
  Filled 2021-05-04: qty 3, 30d supply, fill #2
  Filled 2021-06-24: qty 3, 30d supply, fill #3
  Filled 2021-07-24: qty 3, 30d supply, fill #4
  Filled 2021-09-12: qty 3, 30d supply, fill #5
  Filled 2021-10-18: qty 3, 30d supply, fill #6

## 2021-01-12 NOTE — Patient Instructions (Addendum)
Please continue: - Farxiga 10 mg before b'fast - Humalog 20-25 units before every meal - Basaglar 40 units 2x a day  Increase: - Metformin 1000 mg 2x a day with meals  Please start: - Ozempic 0.25 mg weekly in a.m. (for example on Sunday morning) x 4 weeks, then increase to 0.5 mg weekly in a.m. if no nausea or hypoglycemia.  NO SWEET DRINKS!!!!!  Please come back for a follow-up appointment in 1.5 months.

## 2021-01-12 NOTE — Progress Notes (Signed)
Patient ID: Samantha [name], female   DOB: 08-07-1966, 55 y.o.   MRN: [id number]  This visit occurred during the SARS-CoV-2 public health emergency.  Safety protocols were in place, including screening questions prior to the visit, additional usage of staff PPE, and extensive cleaning of exam room while observing appropriate contact time as indicated for disinfecting solutions.   HPI: Samantha [name] is a 55 y.o.-year-old female, returning for follow-up for DM2, dx 2012, insulin-dependent since 2015, uncontrolled, withcomplications (DR) and medication noncompliance. Last OV 10 mo ago!  Interim history: In 12/2019, she returned after.  When she was off almost all of her medicines.  She was sleeping during the day and stay up at night eating snacks. At last visit, she was doing much better after an episode of pneumonia in 01/2020.  She decreased her intake of Coca-Cola (was previously drinking 6 pack a day).  Sugars improved significantly. However, she was lost for follow-up afterwards, however, and at this visit, she is off her CGM.  Sugars are much higher after she stopped keto diet and started work at [employer] in 06/2020. Moreover, she restarted sweet drinks!  Reviewed HbA1c levels: Lab Results  Component Value Date   HGBA1C 7.7 (A) 03/18/2020   HGBA1C 12.5 (A) 12/24/2019   HGBA1C 10.3 (A) 06/18/2019   HGBA1C 12.1 (A) 03/16/2019   HGBA1C 10.0 (A) 11/13/2018   HGBA1C 7.4 (A) 03/27/2018   HGBA1C 8.4 12/25/2017   HGBA1C 11.7 04/18/2017   HGBA1C 8.2 08/20/2016   HGBA1C 6.7 02/27/2016   HGBA1C 9.8 11/28/2015   HGBA1C 8.5 08/18/2015   HGBA1C 8.9 (H) 05/19/2015   HGBA1C 11.3 (H) 02/15/2015   HGBA1C 12.2 (H) 09/13/2014   05/27/2014: 12% 02/2014: 9%  She is on: - Metformin 1000 mg 2x a day with meals - out for 2-3 weeks - Farxiga 10 mg before b'fast -  >> Humalog 20-25 units before every meal, but 10-15 units before a snack >> 25 units before B, D (misses lunch) - Lantus >> Basaglar  30 units in am and 40 units at night >> 40 units 2x a day She was on Farxiga x 2 years in the past, but this was stopped b/c weight loss (205 >> 179 lbs), urinary frequency She tried regular metformin >> GI sxs.  She checks sugars 1-2 times a day now: - am: 119-173 until 01/04/2021, but afterwards:182-490 - 2h after b'fast: n/c - lunch: 178-440 - 2h after lunch: n/c - dinner: 277 - 2h after dinner: n/c - bedtime: n/c  She had a freestyle libre CGM in the past -now off since this was not covered anymore:    Lowest sugar was 40 (libre) - am >> 70 >> 89 >> 50s >> 119; it is unclear at which level she has hypoglycemia unawareness. Highest sugar was 270 >> 400 >> 300s >> 500 >> 300s >> 490.  Pt's meals are: - Breakfast: cereal or bisquit - Lunch: hamburger, chinese - Dinner: chicken  - Snacks: 2-3: Frosted flakes! Fruit punch!  We discussed about the importance of stopping these. She is still drinking sodas and shakes. Lyumjev  -No CKD: Last BUN/creatinine: 12/28/2020: 23/0.95, glucose 143 Lab Results  Component Value Date   BUN 18 01/28/2020   BUN 18 01/27/2020   CREATININE 0.70 01/28/2020   CREATININE 0.76 01/27/2020  02/10/2015: 13/0.83, GFR 86 11/16/2013: 20/0.92, GFR 79  -+ HL; lipid panel: 12/28/2020: 186/207/46/104 01/29/2019: 226/265/48/125 Lab Results  Component Value Date   CHOL 130 12/25/2017  HDL 35.90 (L) 12/25/2017   LDLCALC 77 12/25/2017   TRIG 248 (H) 01/24/2020   CHOLHDL 4 12/25/2017  02/10/2015: 185/168/44/107  On Crestor.  - last eye exam was  11/16/2020: + DR - [name].  -No numbness and tingling in her feet.  Hypothyroidism after RAI treatment for Graves' disease -History of medication noncompliance  Pt is on levothyroxine 100 mcg daily: - in am - fasting - at least 30 min from b'fast - no Ca, Fe, MVI, PPIs - not on Biotin  Review TFTs: Lab Results  Component Value Date   TSH 0.38 (A) 01/14/2020   TSH 1.95 06/18/2019   TSH  1.028 06/10/2018   TSH 3.56 03/27/2018   TSH 5.58 (H) 12/25/2017   FREET4 0.76 06/18/2019   FREET4 0.83 03/27/2018   FREET4 0.84 12/25/2017   FREET4 0.61 02/27/2016   FREET4 0.71 05/19/2015  07/28/2013: TSH 1.69  Pt denies: - feeling nodules in neck - hoarseness - dysphagia - choking - SOB with lying down  ROS Constitutional: + weight gain/no weight loss, no fatigue, no subjective hyperthermia, no subjective hypothermia Eyes: no blurry vision, no xerophthalmia ENT: no sore throat, + see HPI Cardiovascular: no CP/no SOB/no palpitations/no leg swelling Respiratory: no cough/no SOB/no wheezing Gastrointestinal: no N/no V/no D/no C/no acid reflux Musculoskeletal: no muscle aches/no joint aches Skin: no rashes, no hair loss Neurological: no tremors/no numbness/no tingling/no dizziness  I reviewed pt's medications, allergies, PMH, social hx, family hx, and changes were documented in the history of present illness. Otherwise, unchanged from my initial visit note.  Past Medical History:  Diagnosis Date  . Chest pain of uncertain etiology   . Diabetes mellitus without complication (HCC)    Type 2  . Palpitations   . Thyroid disease   . Urticaria    Past Surgical History:  Procedure Laterality Date  . CESAREAN SECTION    . UTERINE FIBROID EMBOLIZATION     Social History   Socioeconomic History  . Marital status: Single    Spouse name: Not on file  . Number of children: Not on file  . Years of education: Not on file  . Highest education level: Not on file  Occupational History  . Not on file  Tobacco Use  . Smoking status: Never Smoker  . Smokeless tobacco: Never Used  Vaping Use  . Vaping Use: Never used  Substance and Sexual Activity  . Alcohol use: No    Alcohol/week: 0.0 standard drinks  . Drug use: Never  . Sexual activity: Not on file  Other Topics Concern  . Not on file  Social History Narrative  . Not on file   Social Determinants of Health    Financial Resource Strain: Not on file  Food Insecurity: Not on file  Transportation Needs: Not on file  Physical Activity: Not on file  Stress: Not on file  Social Connections: Not on file  Intimate Partner Violence: Not on file   Current Outpatient Medications on File Prior to Visit  Medication Sig Dispense Refill  . BD PEN NEEDLE NANO U/F 32G X 4 MM MISC USE TO INJECT INSULIN FOUR TIMES DAILY. 400 each 11  . Blood Glucose Monitoring Suppl (FREESTYLE LITE) w/Device KIT USE TO CHECK BLOOD SUGAR 4 TIMES DAILY 1 kit 0  . cetirizine (ZYRTEC) 10 MG tablet Take 1 tablet twice daily as needed 60 tablet 5  . Continuous Blood Gluc Sensor (FREESTYLE LIBRE 14 DAY SENSOR) MISC USE TO CHECK BLOOD SUGAR EVERY  14 DAYS, CHANGE EVERY 2 WEEKS 6 each 3  . dapagliflozin propanediol (FARXIGA) 10 MG TABS tablet TAKE 1 TABLET BY MOUTH DAILY 90 tablet 1  . fluticasone (FLONASE) 50 MCG/ACT nasal spray USE 2 SPRAYS IN EACH NOSTRIL ONCE A DAY 48 g 3  . glucose blood (ONETOUCH VERIO) test strip Use as instructed to check blood sugar daily 100 each 12  . glucose blood test strip USE TO CHECK BLOOD SUGAR 4 TIMES DAILY 100 strip 12  . ibuprofen (ADVIL,MOTRIN) 800 MG tablet Take 800 mg by mouth every 8 (eight) hours as needed for mild pain or moderate pain.     . Insulin Glargine (BASAGLAR KWIKPEN) 100 UNIT/ML INJECT 40 UNITS TWICE DAILY. NEEDS APPT FOR FURTHER REFILLS. 30 mL 0  . insulin glargine (LANTUS SOLOSTAR) 100 UNIT/ML Solostar Pen 40 units twice daily 30 mL 0  . insulin lispro (HUMALOG) 200 UNIT/ML KwikPen INJECT UNDER SKIN 20-25 UNITS BEFORE EVERY MEAL, AND 10-15 UNITS BEFORE A SNACK (UP TO 100 UNITS A DAY). 18 mL 3  . Insulin Pen Needle 32G X 4 MM MISC USE TO INJECT INSULIN 100 each 0  . Lancets (FREESTYLE) lancets USE TO CHECK BLOOD SUGAR 4 TIMES DAILY 100 each 12  . levothyroxine (SYNTHROID) 100 MCG tablet TAKE 1 TABLET BY MOUTH DAILY 90 tablet 1  . loratadine (CLARITIN) 10 MG tablet TAKE 1 TABLET BY  MOUTH ONCE A DAY 90 tablet 3  . metFORMIN (GLUCOPHAGE XR) 500 MG 24 hr tablet Take 2 tablets (1,000 mg total) by mouth daily with breakfast. 180 tablet 3  . omeprazole (PRILOSEC) 40 MG capsule Take 40 mg by mouth daily.     Marland Kitchen omeprazole (PRILOSEC) 40 MG capsule Take 1 capsule (40 mg total) by mouth 2 (two) times daily as needed. 180 capsule 1  . OneTouch Delica Lancets 09N MISC Use to check blood sugar daily 100 each 11  . rosuvastatin (CRESTOR) 5 MG tablet Take 1 tablet (5 mg total) by mouth at bedtime. (Patient taking differently: Take 5 mg by mouth daily. ) 90 tablet 0  . rosuvastatin (CRESTOR) 5 MG tablet Take 1 tablet (5 mg total) by mouth daily. 90 tablet 3  . sertraline (ZOLOFT) 100 MG tablet Take 100 mg by mouth daily.    . sertraline (ZOLOFT) 100 MG tablet Take 1 tablet (100 mg total) by mouth daily. 90 tablet 1  . triamcinolone (NASACORT) 55 MCG/ACT AERO nasal inhaler Place 2 sprays into each nostril once daily. 1 each 12  . VITAMIN D, ERGOCALCIFEROL, PO Take 1 capsule by mouth daily.     No current facility-administered medications on file prior to visit.   Allergies  Allergen Reactions  . Bupropion Hives  . Lexapro [Escitalopram] Diarrhea and Nausea Only  . Augmentin [Amoxicillin-Pot Clavulanate] Itching  . Cyclobenzaprine Other (See Comments)    Excessive sleepiness  . Pravastatin Other (See Comments)    Leg cramps/ mylagia  . Atorvastatin Other (See Comments)    Leg cramps  . Doxepin Hcl Other (See Comments)    Nightmares    Family History  Problem Relation Age of Onset  . Dementia Mother   . Diabetes Mother   . Stroke Father     PE: BP 128/82 (BP Location: Right Arm, Patient Position: Sitting, Cuff Size: Normal)   Pulse 87   Ht 5' 6"  (1.676 m)   Wt 196 lb 6.4 oz (89.1 kg)   LMP 07/10/2012   SpO2 96%   BMI 31.70 kg/m  Body mass index is 31.7 kg/m.  Wt Readings from Last 3 Encounters:  01/12/21 196 lb 6.4 oz (89.1 kg)  03/18/20 189 lb 2 oz (85.8 kg)   03/01/20 187 lb 6.4 oz (85 kg)   Constitutional: overweight, in NAD Eyes: PERRLA, EOMI, no exophthalmos ENT: moist mucous membranes, no thyromegaly, no cervical lymphadenopathy Cardiovascular: tachycardia, RR, No MRG Respiratory: CTA B Gastrointestinal: abdomen soft, NT, ND, BS+ Musculoskeletal: no deformities, strength intact in all 4 Skin: moist, warm, no rashes Neurological: no tremor with outstretched hands, DTR normal in all 4  ASSESSMENT: 1. DM2, insulin-dependent, uncontrolled, with complications - DR  2. Hypothyroidism - postablative for Graves ds.  3. HL  PLAN:  1.  Patient with history of uncontrolled type 2 diabetes, on basal-bolus insulin regimen and also metformin and SGLT2 inhibitor.  At last visit, her HbA1c improved to 7.7%.  Sugars were markedly improved.  Her average glucose decreased from 293-136.  She was having low blood sugars overnight and we discussed about reducing Lantus in the evening.  She was having increased blood sugars after breakfast and we discussed about improving the meals and stopping Coca-Cola.  Since then, she had to switch from NovoLog to Humalog. - At this visit, she presents after a longer absence of 10 months.  Sugars are very high! since last OV, she changed her job >> more stressed, she came off her diet and restarting to drink regular sodas,and other sweet drinks and she is now also off metformin after she ran out.  I again strongly advised her to stop any type of sweet drink since I believe that this is the main reason why her sugars increased this much.  We will also add back metformin and increase the dose.  I also suggested a weekly GLP-1 receptor agonist to help with her diabetes control and her cravings.  We will target at a low dose and increase as tolerated.  No family history of medullary thyroid cancer or personal history of pancreatitis.  I also advised her to vary the dose of Humalog based on the size of  Meals. - I suggested to:   Patient Instructions  Please continue: - Farxiga 10 mg before b'fast - Humalog 20-25 units before every meal - Basaglar 40 units 2x a day  Increase: - Metformin 1000 mg 2x a day with meals  Please start: - Ozempic 0.25 mg weekly in a.m. (for example on Sunday morning) x 4 weeks, then increase to 0.5 mg weekly in a.m. if no nausea or hypoglycemia.  NO SWEET DRINKS!!!!!  Please come back for a follow-up appointment in 1.5 months.   - we checked her HbA1c: 11.9% [facility] HIGHER) - advised to check sugars at different times of the day - 4x a day, rotating check times - advised for yearly eye exams >> she is UTD - return to clinic in 1.5 months   2. Hypothyroidism - latest thyroid labs reviewed with pt >> TSH was slightly low at last check: Lab Results  Component Value Date   TSH 0.38 (A) 01/14/2020   - she continues on LT4 100 mcg daily - pt feels good on this dose. - we discussed about taking the thyroid hormone every day, with water, >30 minutes before breakfast, separated by >4 hours from acid reflux medications, calcium, iron, multivitamins. Pt. is taking it correctly. - will check thyroid tests today: TSH and fT4 - If labs are abnormal, she will need to return for repeat TFTs in 1.5 months  3. HL  - Reviewed latest lipid panel from 12/28/2020: Triglycerides high, LDL above our goal of less than 70. -Continues Crestor 5 without side effects -We discussed at this visit again about improving diet, cutting out sweet drinks and processed fats  Component     Latest Ref Rng & Units 01/12/2021  TSH     0.35 - 4.50 uIU/mL 3.75  T4,Free(Direct)     0.60 - 1.60 ng/dL 0.81  TFTs are normal.  Philemon Kingdom, MD PhD [facility] Endocrin[facility]

## 2021-01-13 ENCOUNTER — Other Ambulatory Visit (HOSPITAL_COMMUNITY): Payer: Self-pay

## 2021-01-13 ENCOUNTER — Other Ambulatory Visit: Payer: Self-pay | Admitting: Internal Medicine

## 2021-01-13 DIAGNOSIS — E11311 Type 2 diabetes mellitus with unspecified diabetic retinopathy with macular edema: Secondary | ICD-10-CM

## 2021-01-13 DIAGNOSIS — Z794 Long term (current) use of insulin: Secondary | ICD-10-CM

## 2021-01-13 MED ORDER — INSULIN GLARGINE-YFGN 100 UNIT/ML ~~LOC~~ SOLN: 40.0000 [IU] | Freq: Two times a day (BID) | SUBCUTANEOUS | 1 refills | Status: DC

## 2021-01-13 MED FILL — Insulin Lispro Soln Pen-injector 200 Unit/ML: SUBCUTANEOUS | 28 days supply | Qty: 12 | Fill #1 | Status: CN

## 2021-01-16 ENCOUNTER — Other Ambulatory Visit (HOSPITAL_COMMUNITY): Payer: Self-pay

## 2021-01-18 ENCOUNTER — Telehealth: Payer: Self-pay | Admitting: Internal Medicine

## 2021-01-18 ENCOUNTER — Other Ambulatory Visit (HOSPITAL_COMMUNITY): Payer: Self-pay

## 2021-01-18 MED ORDER — INSULIN GLARGINE-YFGN 100 UNIT/ML ~~LOC~~ SOPN
PEN_INJECTOR | SUBCUTANEOUS | 1 refills | Status: DC
  Filled 2021-01-18: qty 30, 37d supply, fill #0
  Filled 2021-02-27: qty 30, 37d supply, fill #1

## 2021-01-18 NOTE — Telephone Encounter (Signed)
Patient called asking if we received a request for a pre auth for her dexcom supplies? Please advise.

## 2021-01-20 ENCOUNTER — Other Ambulatory Visit (HOSPITAL_COMMUNITY): Payer: Self-pay

## 2021-01-20 MED FILL — Glucose Blood Test Strip: 25 days supply | Qty: 100 | Fill #0 | Status: AC

## 2021-01-20 NOTE — Telephone Encounter (Signed)
Patient requests to be called at ph# [phone] re: status of PA for [employer] (meter, etc.)

## 2021-01-23 ENCOUNTER — Other Ambulatory Visit (HOSPITAL_COMMUNITY): Payer: Self-pay

## 2021-01-23 NOTE — Telephone Encounter (Signed)
Pt calling to F/U on the PA for her dexcom. Pt would a call back regarding this matter.   

## 2021-01-25 ENCOUNTER — Other Ambulatory Visit (HOSPITAL_COMMUNITY): Payer: Self-pay

## 2021-01-26 ENCOUNTER — Other Ambulatory Visit (HOSPITAL_COMMUNITY): Payer: Self-pay

## 2021-01-26 NOTE — Telephone Encounter (Signed)
Pt is calling to F/u up on this matter.

## 2021-01-28 ENCOUNTER — Other Ambulatory Visit (HOSPITAL_COMMUNITY): Payer: Self-pay

## 2021-01-30 ENCOUNTER — Other Ambulatory Visit (HOSPITAL_COMMUNITY): Payer: Self-pay

## 2021-02-01 ENCOUNTER — Other Ambulatory Visit (HOSPITAL_COMMUNITY): Payer: Self-pay

## 2021-02-07 ENCOUNTER — Other Ambulatory Visit (HOSPITAL_COMMUNITY): Payer: Self-pay

## 2021-02-07 NOTE — Telephone Encounter (Signed)
Pt calling to F/U on the PA for her dexcom. Pt would a call back regarding this matter.

## 2021-02-10 ENCOUNTER — Other Ambulatory Visit (HOSPITAL_COMMUNITY): Payer: Self-pay

## 2021-02-10 NOTE — Telephone Encounter (Signed)
Called and spoke to pt and advised pt submitted and approved for transmitter. Awaiting approval for sensors.

## 2021-02-13 ENCOUNTER — Other Ambulatory Visit (HOSPITAL_COMMUNITY): Payer: Self-pay

## 2021-02-13 ENCOUNTER — Other Ambulatory Visit: Payer: Self-pay | Admitting: Internal Medicine

## 2021-02-14 ENCOUNTER — Other Ambulatory Visit (HOSPITAL_COMMUNITY): Payer: Self-pay

## 2021-02-14 MED ORDER — LEVOTHYROXINE SODIUM 100 MCG PO TABS
ORAL_TABLET | Freq: Every day | ORAL | 1 refills | Status: DC
  Filled 2021-02-14: qty 90, 90d supply, fill #0
  Filled 2021-05-28: qty 90, 90d supply, fill #1

## 2021-02-14 MED FILL — Insulin Lispro Soln Pen-injector 200 Unit/ML: SUBCUTANEOUS | 28 days supply | Qty: 12 | Fill #1 | Status: AC

## 2021-02-15 ENCOUNTER — Other Ambulatory Visit (HOSPITAL_COMMUNITY): Payer: Self-pay

## 2021-02-16 ENCOUNTER — Ambulatory Visit: Payer: 59 | Admitting: Internal Medicine

## 2021-02-16 ENCOUNTER — Encounter: Payer: Self-pay | Admitting: Internal Medicine

## 2021-02-16 ENCOUNTER — Other Ambulatory Visit: Payer: Self-pay

## 2021-02-16 VITALS — BP 128/88 | HR 96 | Ht 66.0 in | Wt 189.6 lb

## 2021-02-16 DIAGNOSIS — E890 Postprocedural hypothyroidism: Secondary | ICD-10-CM | POA: Diagnosis not present

## 2021-02-16 DIAGNOSIS — IMO0002 Reserved for concepts with insufficient information to code with codable children: Secondary | ICD-10-CM

## 2021-02-16 DIAGNOSIS — E1165 Type 2 diabetes mellitus with hyperglycemia: Secondary | ICD-10-CM | POA: Diagnosis not present

## 2021-02-16 DIAGNOSIS — E782 Mixed hyperlipidemia: Secondary | ICD-10-CM | POA: Diagnosis not present

## 2021-02-16 DIAGNOSIS — E11311 Type 2 diabetes mellitus with unspecified diabetic retinopathy with macular edema: Secondary | ICD-10-CM

## 2021-02-16 NOTE — Patient Instructions (Signed)
Please continue: - Farxiga 10 mg before b'fast - Humalog 20-25 units before every meal - Semglee 40 units 2x a day - Metformin 1000 mg 2x a day with meals - Ozempic 0.5 mg weekly  TRY TO STOP SWEET DRINKS!!!!!  Please let me know if the sugars drop again <70.  Please come back for a follow-up appointment in 3 months.

## 2021-02-16 NOTE — Progress Notes (Signed)
Patient ID: Samantha [name], female   DOB: 1965-10-17, 55 y.o.   MRN: [id number]  This visit occurred during the SARS-CoV-2 public health emergency.  Safety protocols were in place, including screening questions prior to the visit, additional usage of staff PPE, and extensive cleaning of exam room while observing appropriate contact time as indicated for disinfecting solutions.   HPI: Samantha [name] is a 55 y.o.-year-old female, returning for follow-up for DM2, dx 2012, insulin-dependent since 2015, uncontrolled, withcomplications (DR) and medication noncompliance.  Last visit 1 month ago.  Interim history: At last visit, sugars were much worse after she restarted sweet drinks, relaxed her diet, and ran out of metformin.  At that time, she returned after 10 months of absence. She still drinks regular Coca Cola 1x a day - 20 oz.  She has a little nausea.  Reviewed HbA1c levels: Lab Results  Component Value Date   HGBA1C 11.9 (A) 01/12/2021   HGBA1C 7.7 (A) 03/18/2020   HGBA1C 12.5 (A) 12/24/2019   HGBA1C 10.3 (A) 06/18/2019   HGBA1C 12.1 (A) 03/16/2019   HGBA1C 10.0 (A) 11/13/2018   HGBA1C 7.4 (A) 03/27/2018   HGBA1C 8.4 12/25/2017   HGBA1C 11.7 04/18/2017   HGBA1C 8.2 08/20/2016   HGBA1C 6.7 02/27/2016   HGBA1C 9.8 11/28/2015   HGBA1C 8.5 08/18/2015   HGBA1C 8.9 (H) 05/19/2015   HGBA1C 11.3 (H) 02/15/2015   HGBA1C 12.2 (H) 09/13/2014   05/27/2014: 12% 02/2014: 9%  She is on: - Metformin 1000 mg 2x a day with meals-restarted 01/2021 - Farxiga 10 mg before b'fast - Humalog (20-)25 units before every meal  - Basaglar >> Semglee 40 units 2x a day - Ozempic 0.5 mg weekly - started 01/2021 She was on Farxiga x 2 years in the past, but this was stopped b/c weight loss (205 >> 179 lbs), urinary frequency.  Tolerated well now. She tried regular metformin >> GI sxs.  She had a freestyle libre CGM in the past but this was not covered anymore at last visit.  She now has a  Dexcom CGM - needs to attach it. - am: 67, 96-145, 160 - 2h after b'fast: n/c - lunch: 88-159 - 2h after lunch: 167-177 - dinner: 54, 78-122 - 2h after dinner: n/c - bedtime: n/c  Previously:   Lowest sugar was 50s >> 119 >> 54; it is unclear at which level she has hypoglycemia unawareness. Highest sugar was 500 >> 300s >> 490 >> 177.  Pt's meals are: - Breakfast: cereal or bisquit - Lunch: hamburger, chinese - Dinner: chicken  - Snacks: 2-3: Frosted flakes! Fruit punch!  We discussed about the importance of stopping these. At last visit she was drinking sodas and shakes, now less.  -No CKD: Last BUN/creatinine: 12/28/2020: 23/0.95, glucose 143 Lab Results  Component Value Date   BUN 18 01/28/2020   BUN 18 01/27/2020   CREATININE 0.70 01/28/2020   CREATININE 0.76 01/27/2020  02/10/2015: 13/0.83, GFR 86 11/16/2013: 20/0.92, GFR 79  -+ HL; lipid panel: 12/28/2020: 186/207/46/104 01/29/2019: 226/265/48/125 Lab Results  Component Value Date   CHOL 130 12/25/2017   HDL 35.90 (L) 12/25/2017   LDLCALC 77 12/25/2017   TRIG 248 (H) 01/24/2020   CHOLHDL 4 12/25/2017  02/10/2015: 185/168/44/107  On Crestor.  - last eye exam was  11/16/2020: + DR - [name].  -No numbness and tingling in her feet.  Hypothyroidism after RAI treatment for Graves' disease -History of medication noncompliance  Pt is on levothyroxine 100  mcg daily: - in am - fasting - at least 30 min from b'fast - no Ca, Fe, MVI, PPIs - not on Biotin  Review TFTs: Lab Results  Component Value Date   TSH 3.75 01/12/2021   TSH 0.38 (A) 01/14/2020   TSH 1.95 06/18/2019   TSH 1.028 06/10/2018   TSH 3.56 03/27/2018   FREET4 0.81 01/12/2021   FREET4 0.76 06/18/2019   FREET4 0.83 03/27/2018   FREET4 0.84 12/25/2017   FREET4 0.61 02/27/2016  07/28/2013: TSH 1.69  Pt denies: - feeling nodules in neck - hoarseness - dysphagia - choking - SOB with lying down  ROS Constitutional: no weight  gain/+ weight loss, no fatigue, no subjective hyperthermia, no subjective hypothermia Eyes: +  blurry vision, no xerophthalmia ENT: no sore throat, + see HPI Cardiovascular: no CP/no SOB/no palpitations/no leg swelling Respiratory: no cough/no SOB/no wheezing Gastrointestinal: no N/no V/no D/no C/no acid reflux Musculoskeletal: no muscle aches/no joint aches Skin: no rashes, no hair loss Neurological: no tremors/no numbness/no tingling/no dizziness  I reviewed pt's medications, allergies, PMH, social hx, family hx, and changes were documented in the history of present illness. Otherwise, unchanged from my initial visit note.  Past Medical History:  Diagnosis Date   Chest pain of uncertain etiology    Diabetes mellitus without complication ([place])    Type 2   Palpitations    Thyroid disease    Urticaria    Past Surgical History:  Procedure Laterality Date   CESAREAN SECTION     UTERINE FIBROID EMBOLIZATION     Social History   Socioeconomic History   Marital status: Single    Spouse name: Not on file   Number of children: Not on file   Years of education: Not on file   Highest education level: Not on file  Occupational History   Not on file  Tobacco Use   Smoking status: Never   Smokeless tobacco: Never  Vaping Use   Vaping Use: Never used  Substance and Sexual Activity   Alcohol use: No    Alcohol/week: 0.0 standard drinks   Drug use: Never   Sexual activity: Not on file  Other Topics Concern   Not on file  Social History Narrative   Not on file   Social Determinants of Health   Financial Resource Strain: Not on file  Food Insecurity: Not on file  Transportation Needs: Not on file  Physical Activity: Not on file  Stress: Not on file  Social Connections: Not on file  Intimate Partner Violence: Not on file   Current Outpatient Medications on File Prior to Visit  Medication Sig Dispense Refill   Blood Glucose Monitoring Suppl (FREESTYLE LITE) w/Device KIT USE  TO CHECK BLOOD SUGAR 4 TIMES DAILY 1 kit 0   cetirizine (ZYRTEC) 10 MG tablet Take 1 tablet twice daily as needed 60 tablet 5   Continuous Blood Gluc Sensor (DEXCOM G6 SENSOR) MISC Inject 1 Device into the skin continuous for 10 days. 9 each 3   Continuous Blood Gluc Transmit (DEXCOM G6 TRANSMITTER) MISC Use every 3 (three) months. 1 each 3   fluticasone (FLONASE) 50 MCG/ACT nasal spray USE 2 SPRAYS IN EACH NOSTRIL ONCE A DAY 48 g 3   glucose blood (ONETOUCH VERIO) test strip Use as instructed to check blood sugar daily 100 each 12   glucose blood test strip USE TO CHECK BLOOD SUGAR 4 TIMES DAILY (Patient taking differently: USE TO CHECK BLOOD SUGAR 4 TIMES DAILY) 100 strip 12  ibuprofen (ADVIL,MOTRIN) 800 MG tablet Take 800 mg by mouth every 8 (eight) hours as needed for mild pain or moderate pain.      Insulin Glargine-yfgn (SEMGLEE, YFGN,) 100 UNIT/ML SOLN Inject 40 Units into the skin in the morning and at bedtime. 30 mL 1   Insulin Glargine-yfgn (SEMGLEE, YFGN,) 100 UNIT/ML SOPN Inject 40 units into the skin every morning and 40 units every evening at bedtime 30 mL 1   insulin lispro (HUMALOG) 200 UNIT/ML KwikPen INJECT UNDER SKIN 20-25 UNITS BEFORE EVERY MEAL, AND 10-15 UNITS BEFORE A SNACK (UP TO 100 UNITS A DAY). 18 mL 3   Insulin Pen Needle (UNIFINE PENTIPS) 32G X 4 MM MISC Use 5 times a day 400 each 3   Insulin Pen Needle 32G X 4 MM MISC USE TO INJECT INSULIN 100 each 0   Lancets (FREESTYLE) lancets USE TO CHECK BLOOD SUGAR 4 TIMES DAILY (Patient taking differently: USE TO CHECK BLOOD SUGAR 4 TIMES DAILY) 100 each 12   levothyroxine (SYNTHROID) 100 MCG tablet TAKE 1 TABLET BY MOUTH DAILY 90 tablet 1   loratadine (CLARITIN) 10 MG tablet TAKE 1 TABLET BY MOUTH ONCE A DAY 90 tablet 3   metFORMIN (GLUCOPHAGE XR) 500 MG 24 hr tablet Take 2 tablets (1,000 mg total) by mouth 2 (two) times daily with a meal. 360 tablet 3   omeprazole (PRILOSEC) 40 MG capsule Take 40 mg by mouth daily.       omeprazole (PRILOSEC) 40 MG capsule Take 1 capsule (40 mg total) by mouth 2 (two) times daily as needed. 180 capsule 1   OneTouch Delica Lancets 05L MISC Use to check blood sugar daily 100 each 11   rosuvastatin (CRESTOR) 5 MG tablet Take 1 tablet (5 mg total) by mouth at bedtime. (Patient taking differently: Take 5 mg by mouth daily.) 90 tablet 0   rosuvastatin (CRESTOR) 5 MG tablet Take 1 tablet (5 mg total) by mouth daily. 90 tablet 3   Semaglutide,0.25 or 0.5MG/DOS, (OZEMPIC, 0.25 OR 0.5 MG/DOSE,) 2 MG/1.5ML SOPN Inject 0.5 mg into the skin once a week. 4.5 mL 3   sertraline (ZOLOFT) 100 MG tablet Take 100 mg by mouth daily.     sertraline (ZOLOFT) 100 MG tablet Take 1 tablet (100 mg total) by mouth daily. 90 tablet 1   triamcinolone (NASACORT) 55 MCG/ACT AERO nasal inhaler Place 2 sprays into each nostril once daily. 1 each 12   VITAMIN D, ERGOCALCIFEROL, PO Take 1 capsule by mouth daily.     No current facility-administered medications on file prior to visit.   Allergies  Allergen Reactions   Bupropion Hives   Lexapro [Escitalopram] Diarrhea and Nausea Only   Augmentin [Amoxicillin-Pot Clavulanate] Itching   Cyclobenzaprine Other (See Comments)    Excessive sleepiness   Pravastatin Other (See Comments)    Leg cramps/ mylagia   Atorvastatin Other (See Comments)    Leg cramps   Doxepin Hcl Other (See Comments)    Nightmares    Family History  Problem Relation Age of Onset   Dementia Mother    Diabetes Mother    Stroke Father     PE: BP 128/88 (BP Location: Right Arm, Patient Position: Sitting, Cuff Size: Normal)   Pulse 96   Ht _0  (1.676 m)   Wt 189 lb 9.6 oz (86 kg)   LMP 07/10/2012   SpO2 96%   BMI 30.60 kg/m  Body mass index is 30.6 kg/m.  Wt Readings from Last 3 Encounters:  02/16/21 189 lb 9.6 oz (86 kg)  01/12/21 196 lb 6.4 oz (89.1 kg)  03/18/20 189 lb 2 oz (85.8 kg)   Constitutional: overweight, in NAD Eyes: PERRLA, EOMI, no exophthalmos ENT: moist  mucous membranes, no thyromegaly, no cervical lymphadenopathy Cardiovascular: tachycardia, RR, No MRG Respiratory: CTA B Gastrointestinal: abdomen soft, NT, ND, BS+ Musculoskeletal: no deformities, strength intact in all 4 Skin: moist, warm, no rashes Neurological: no tremor with outstretched hands, DTR normal in all 4  ASSESSMENT: 1. DM2, insulin-dependent, uncontrolled, with complications - DR  No family history of medullary thyroid cancer or personal history of pancreatitis.  2. Hypothyroidism - postablative for Graves ds.  3. HL  PLAN:  1.  Patient with history of uncontrolled type 2 diabetes, on basal and bolus insulin regimen, metformin, SGLT2 inhibitor, and also weekly GLP-1 receptor agonist added at last visit.  At that time, she returned after an absence of 10 months.  Sugars were much worse and HbA1c was 11.9% after she restarted sweet drinks, relaxed her diet and had more stress at work after changing her job.  She was also off metformin after she ran out.  I strongly advised her to stay off regular sodas, restarted metformin, and I also advised her to add Ozempic.  We started at a low dose and advised her to increase as tolerated. -At today's visit, sugars are much improved, even to the point of lows.  She had 1 lower blood sugar at 54 2 weeks ago, but no lows since then.  She tolerates Ozempic fairly, with mild nausea.  Therefore, we will not increase the dose of Ozempic for now, but I advised her to continue the same regimen but try to use lower Humalog doses with smaller meals, down to 20 units, since she is mostly using the 25 unit dose.  I advised her to let me know if she has any more sugars under 70s. -I congratulated her for the 8 pound weight loss since last visit. - I suggested to:  Patient Instructions  Please continue: - Farxiga 10 mg before b'fast - Humalog 20-25 units before every meal - Semglee 40 units 2x a day - Metformin 1000 mg 2x a day with meals -  Ozempic 0.5 mg weekly  TRY TO STOP SWEET DRINKS!!!!!  Please let me know if the sugars drop again <70.  Please come back for a follow-up appointment in 3 months.  - advised to check sugars at different times of the day - 4x a day, rotating check times - advised for yearly eye exams >> she is UTD - return to clinic in 3 months  2. Hypothyroidism - latest thyroid labs reviewed with pt. >> normal: Lab Results  Component Value Date   TSH 3.75 01/12/2021  - she continues on LT4 100 mcg daily - pt feels good on this dose. - we discussed about taking the thyroid hormone every day, with water, >30 minutes before breakfast, separated by >4 hours from acid reflux medications, calcium, iron, multivitamins. Pt. is taking it correctly.  3. HL  -Reviewed latest lipid panel from 12/2020: Triglycerides high, LDL above our goal of less than 70 -Continues 5 mg of Crestor daily, without side effects -We discussed about improving diet by cutting out sweet drinks and processed fats at last visit.  She reduced sodas to 1 a day.  I again strongly advised her to continue.  Philemon Kingdom, MD PhD [facility]

## 2021-02-28 ENCOUNTER — Other Ambulatory Visit (HOSPITAL_COMMUNITY): Payer: Self-pay

## 2021-03-16 ENCOUNTER — Telehealth: Payer: Self-pay | Admitting: Pharmacy Technician

## 2021-03-16 NOTE — Telephone Encounter (Signed)
[facility] Patient Advocate Encounter  Prior Authorization for [place] has been approved.      Effective dates: 02/09/2021 through 02/09/2022    [facility] will continue to follow.   Venida Jarvis. Nadara Mustard, CPhT Patient Advocate [facility] Phone: [phone] Fax:  [phone]

## 2021-03-17 ENCOUNTER — Other Ambulatory Visit (HOSPITAL_COMMUNITY): Payer: Self-pay

## 2021-03-18 ENCOUNTER — Other Ambulatory Visit (HOSPITAL_COMMUNITY): Payer: Self-pay

## 2021-03-20 ENCOUNTER — Other Ambulatory Visit (HOSPITAL_COMMUNITY): Payer: Self-pay

## 2021-03-24 ENCOUNTER — Other Ambulatory Visit (HOSPITAL_COMMUNITY): Payer: Self-pay

## 2021-03-24 DIAGNOSIS — J069 Acute upper respiratory infection, unspecified: Secondary | ICD-10-CM | POA: Diagnosis not present

## 2021-03-24 DIAGNOSIS — Z03818 Encounter for observation for suspected exposure to other biological agents ruled out: Secondary | ICD-10-CM | POA: Diagnosis not present

## 2021-03-24 DIAGNOSIS — R059 Cough, unspecified: Secondary | ICD-10-CM | POA: Diagnosis not present

## 2021-03-24 DIAGNOSIS — R112 Nausea with vomiting, unspecified: Secondary | ICD-10-CM | POA: Diagnosis not present

## 2021-03-24 MED ORDER — ONDANSETRON 4 MG PO TBDP
ORAL_TABLET | ORAL | 0 refills | Status: DC
  Filled 2021-03-24: qty 9, 3d supply, fill #0

## 2021-03-29 DIAGNOSIS — E1121 Type 2 diabetes mellitus with diabetic nephropathy: Secondary | ICD-10-CM | POA: Diagnosis not present

## 2021-03-29 DIAGNOSIS — R109 Unspecified abdominal pain: Secondary | ICD-10-CM | POA: Diagnosis not present

## 2021-03-29 DIAGNOSIS — R197 Diarrhea, unspecified: Secondary | ICD-10-CM | POA: Diagnosis not present

## 2021-03-29 DIAGNOSIS — R1010 Upper abdominal pain, unspecified: Secondary | ICD-10-CM | POA: Diagnosis not present

## 2021-03-30 ENCOUNTER — Other Ambulatory Visit: Payer: Self-pay

## 2021-03-30 ENCOUNTER — Emergency Department (HOSPITAL_BASED_OUTPATIENT_CLINIC_OR_DEPARTMENT_OTHER)
Admission: EM | Admit: 2021-03-30 | Discharge: 2021-03-30 | Disposition: A | Discharge status: Home / Self Care | Payer: 59 | Attending: Emergency Medicine | Admitting: Emergency Medicine

## 2021-03-30 ENCOUNTER — Encounter (HOSPITAL_BASED_OUTPATIENT_CLINIC_OR_DEPARTMENT_OTHER): Payer: Self-pay

## 2021-03-30 DIAGNOSIS — R319 Hematuria, unspecified: Secondary | ICD-10-CM | POA: Insufficient documentation

## 2021-03-30 DIAGNOSIS — R69 Illness, unspecified: Secondary | ICD-10-CM | POA: Diagnosis not present

## 2021-03-30 DIAGNOSIS — Z0279 Encounter for issue of other medical certificate: Secondary | ICD-10-CM | POA: Diagnosis not present

## 2021-03-30 DIAGNOSIS — Z0389 Encounter for observation for other suspected diseases and conditions ruled out: Secondary | ICD-10-CM | POA: Diagnosis not present

## 2021-03-30 DIAGNOSIS — R509 Fever, unspecified: Secondary | ICD-10-CM

## 2021-03-30 DIAGNOSIS — Z5321 Procedure and treatment not carried out due to patient leaving prior to being seen by health care provider: Secondary | ICD-10-CM | POA: Insufficient documentation

## 2021-03-30 LAB — URINALYSIS, ROUTINE W REFLEX MICROSCOPIC
Bilirubin Urine: NEGATIVE
Glucose, UA: 500 mg/dL — AB
Hgb urine dipstick: NEGATIVE
Ketones, ur: NEGATIVE mg/dL
Leukocytes,Ua: NEGATIVE
Nitrite: NEGATIVE
Protein, ur: 30 mg/dL — AB
Specific Gravity, Urine: 1.029 (ref 1.005–1.030)
pH: 6 (ref 5.0–8.0)

## 2021-03-30 MED ORDER — ACETAMINOPHEN 325 MG PO TABS
650.0000 mg | ORAL_TABLET | Freq: Once | ORAL | Status: AC | PRN
  Administered 2021-03-30: 650 mg via ORAL
  Filled 2021-03-30: qty 2

## 2021-03-30 NOTE — ED Notes (Signed)
Patient stated she would like to leave. Patient made aware of current departmental times and patient states she would wait a few more minutes to determine departure.

## 2021-03-30 NOTE — ED Notes (Signed)
ED Provider at bedside. 

## 2021-03-30 NOTE — ED Triage Notes (Addendum)
Pt states she saw her PCP yesterday afternoon and was told she has blood in her urine and an abnormal white count.  Denies frequency, burning, or pain with urination.  Denies fevers.  Denies any abdominal or flank pain.  Reports she had some nausea earlier but not having any now.  States she is taking an antibiotic prescribed by her PCP yesterday.

## 2021-03-30 NOTE — ED Notes (Signed)
Patient Discharged. Patient seen leaving Department alerting Staff of Departure.

## 2021-03-30 NOTE — ED Provider Notes (Signed)
[facility] EMERGENCY DEPT Provider Note   CSN: [id number] Arrival date & time: 03/30/21  1635     History Chief Complaint  Patient presents with   Hematuria    Samantha [name] is a 55 y.o. female.  Patient indicates saw pcp, Dr Deland Pretty yesterday w some nausea, and states had labs and urine checked. Symptoms acute onset, moderate, persistent. Pt indicates was told she had possible uti, and was prescribed antibiotic.  Pt indicates was not able to go to work today, and needs note for work. Denies dysuria or hematuria. Denies abd, pelvic or  flank pain. No fever or chills. No anticoag use. No faintness or dizziness. No acute or abrupt worsening of symptoms today, but states her work requires note.    Hematuria Pertinent negatives include no chest pain, no abdominal pain and no shortness of breath.      Past Medical History:  Diagnosis Date   Chest pain of uncertain etiology    Diabetes mellitus without complication ([place])    Type 2   Palpitations    Thyroid disease    Urticaria     Patient Active Problem List   Diagnosis Date Noted   Acute right eye pain 01/26/2020   Acute respiratory failure with hypoxia ([place]) 01/26/2020   Multifocal pneumonia 01/24/2020   Mixed hyperlipidemia 11/13/2018   Uncontrolled type 2 diabetes mellitus with hyperglycemia, with long-term current use of insulin ([place]) 06/10/2017   Hypothyroidism, postablative 06/24/2014    Past Surgical History:  Procedure Laterality Date   CESAREAN SECTION     UTERINE FIBROID EMBOLIZATION       OB History   No obstetric history on file.     Family History  Problem Relation Age of Onset   Dementia Mother    Diabetes Mother    Stroke Father     Social History   Tobacco Use   Smoking status: Never   Smokeless tobacco: Never  Vaping Use   Vaping Use: Never used  Substance Use Topics   Alcohol use: No    Alcohol/week: 0.0 standard drinks   Drug use: Never    Home  Medications Prior to Admission medications   Medication Sig Start Date End Date Taking? Authorizing Provider  Blood Glucose Monitoring Suppl (FREESTYLE LITE) w/Device KIT USE TO CHECK BLOOD SUGAR 4 TIMES DAILY 08/19/20 08/19/21 Yes Philemon Kingdom, MD  Continuous Blood Gluc Sensor (DEXCOM G6 SENSOR) MISC Inject 1 Device into the skin continuous for 10 days. 01/12/21 04/17/21 Yes Philemon Kingdom, MD  Continuous Blood Gluc Transmit (DEXCOM G6 TRANSMITTER) MISC Use every 3 (three) months. 01/12/21  Yes Philemon Kingdom, MD  glucose blood (ONETOUCH VERIO) test strip Use as instructed to check blood sugar daily 03/11/20  Yes Philemon Kingdom, MD  glucose blood test strip USE TO CHECK BLOOD SUGAR 4 TIMES DAILY Patient taking differently: USE TO CHECK BLOOD SUGAR 4 TIMES DAILY 08/19/20 08/19/21 Yes Philemon Kingdom, MD  ibuprofen (ADVIL,MOTRIN) 800 MG tablet Take 800 mg by mouth every 8 (eight) hours as needed for mild pain or moderate pain.    Yes [provider]  Insulin Glargine-yfgn (SEMGLEE, YFGN,) 100 UNIT/ML SOLN Inject 40 Units into the skin in the morning and at bedtime. 01/13/21  Yes Philemon Kingdom, MD  Insulin Glargine-yfgn (SEMGLEE, YFGN,) 100 UNIT/ML SOPN Inject 40 units into the skin every morning and 40 units every evening at bedtime 01/13/21  Yes Philemon Kingdom, MD  insulin lispro (HUMALOG) 200 UNIT/ML KwikPen INJECT UNDER SKIN 20-25  UNITS BEFORE EVERY MEAL, AND 10-15 UNITS BEFORE A SNACK (UP TO 100 UNITS A DAY). 09/20/20 09/20/21 Yes Philemon Kingdom, MD  Insulin Pen Needle (UNIFINE PENTIPS) 32G X 4 MM MISC Use 5 times a day 01/12/21  Yes Philemon Kingdom, MD  Lancets (FREESTYLE) lancets USE TO CHECK BLOOD SUGAR 4 TIMES DAILY Patient taking differently: USE TO CHECK BLOOD SUGAR 4 TIMES DAILY 08/19/20 08/19/21 Yes Philemon Kingdom, MD  levothyroxine (SYNTHROID) 100 MCG tablet TAKE 1 TABLET BY MOUTH DAILY 02/14/21 02/14/22 Yes Philemon Kingdom, MD  metFORMIN (GLUCOPHAGE XR) 500 MG 24  hr tablet Take 2 tablets (1,000 mg total) by mouth 2 (two) times daily with a meal. 01/12/21  Yes Philemon Kingdom, MD  ondansetron (ZOFRAN-ODT) 4 MG disintegrating tablet Dissolve 1 tablet by mouth 3 times daily for 3 days 03/24/21  Yes   OneTouch Delica Lancets 04V MISC Use to check blood sugar daily 03/15/20  Yes Philemon Kingdom, MD  Semaglutide,0.25 or 0.5MG/DOS, (OZEMPIC, 0.25 OR 0.5 MG/DOSE,) 2 MG/1.5ML SOPN Inject 0.5 mg into the skin once a week. 01/12/21  Yes Philemon Kingdom, MD  sertraline (ZOLOFT) 100 MG tablet Take 100 mg by mouth daily. 01/14/20  Yes [provider]  sertraline (ZOLOFT) 100 MG tablet Take 1 tablet (100 mg total) by mouth daily. 12/28/20  Yes   cetirizine (ZYRTEC) 10 MG tablet Take 1 tablet twice daily as needed 08/01/18   Kennith Gain, MD  fluticasone [facility]) 50 MCG/ACT nasal spray USE 2 SPRAYS IN [facility] NOSTRIL ONCE A DAY 10/25/20 10/25/21  Harlan Stains, MD  Insulin Pen Needle 32G X 4 MM MISC USE TO INJECT INSULIN 12/28/20 12/28/21  Philemon Kingdom, MD  loratadine (CLARITIN) 10 MG tablet TAKE 1 TABLET BY MOUTH ONCE A DAY 10/25/20 10/25/21  Harlan Stains, MD  omeprazole (PRILOSEC) 40 MG capsule Take 40 mg by mouth daily.     [provider]  omeprazole (PRILOSEC) 40 MG capsule Take 1 capsule (40 mg total) by mouth 2 (two) times daily as needed. 12/15/20     rosuvastatin (CRESTOR) 5 MG tablet Take 1 tablet (5 mg total) by mouth at bedtime. Patient taking differently: Take 5 mg by mouth daily. 10/26/16   Philemon Kingdom, MD  rosuvastatin (CRESTOR) 5 MG tablet Take 1 tablet (5 mg total) by mouth daily. 12/28/20     triamcinolone (NASACORT) 55 MCG/ACT AERO nasal inhaler Place 2 sprays into each nostril once daily. 12/15/20     VITAMIN D, ERGOCALCIFEROL, PO Take 1 capsule by mouth daily.    [provider]    Allergies    Bupropion, Lexapro [escitalopram], Augmentin [amoxicillin-pot clavulanate], Cyclobenzaprine, Pravastatin, Atorvastatin,  and Doxepin hcl  Review of Systems   Review of Systems  Constitutional:  Negative for chills and fever.  HENT:  Negative for sore throat.   Eyes:  Negative for redness.  Respiratory:  Negative for cough and shortness of breath.   Cardiovascular:  Negative for chest pain.  Gastrointestinal:  Positive for nausea. Negative for abdominal pain.  Genitourinary:  Negative for dysuria, flank pain and vaginal bleeding.       States told microscopic blood in urine.   Musculoskeletal:  Negative for back pain and neck pain.  Skin:  Negative for rash.  Neurological:  Negative for light-headedness.  Hematological:  Does not bruise/bleed easily.       Denies abnormal bruising or bleeding. No blood in stool. No vaginal bleeding.   Psychiatric/Behavioral:  Negative for confusion.    Physical Exam Updated  Vital Signs Ht 1.676 m (_0 )   Wt 82.1 kg   LMP 07/10/2012   BMI 29.21 kg/m   Physical Exam Vitals and nursing note reviewed.  Constitutional:      Appearance: Normal appearance. She is well-developed.  HENT:     Head: Atraumatic.     Nose: Nose normal.     Mouth/Throat:     Mouth: Mucous membranes are moist.  Eyes:     General: No scleral icterus.    Conjunctiva/sclera: Conjunctivae normal.  Neck:     Trachea: No tracheal deviation.  Cardiovascular:     Rate and Rhythm: Normal rate.     Pulses: Normal pulses.     Heart sounds: Normal heart sounds. No murmur heard.   No friction rub. No gallop.  Pulmonary:     Effort: Pulmonary effort is normal. No respiratory distress.     Breath sounds: Normal breath sounds.  Abdominal:     General: Bowel sounds are normal. There is no distension.     Palpations: Abdomen is soft. There is no mass.     Tenderness: There is no abdominal tenderness. There is no guarding.  Genitourinary:    Comments: No cva tenderness.  Musculoskeletal:        General: No swelling.     Cervical back: Neck supple. No muscular tenderness.  Skin:    General:  Skin is warm and dry.     Findings: No rash.  Neurological:     Mental Status: She is alert.     Comments: Alert, speech normal. Steady gait.   Psychiatric:        Mood and Affect: Mood normal.    ED Results / Procedures / Treatments   Labs (all labs ordered are listed, but only abnormal results are displayed) Labs Reviewed - No data to display  EKG None  Radiology No results found.  Procedures Procedures   Medications Ordered in ED Medications - No data to display  ED Course  I have reviewed the triage vital signs and the nursing notes.  Pertinent labs & imaging results that were available during my care of the patient were reviewed by me and considered in my medical decision making (see chart for details).    MDM Rules/Calculators/A&P                           Reviewed nursing notes and prior charts for additional history.   Pt reports recent labs and pcp office and starting on po abx for possible uti.   Denies any acute or abrupt worsening of earlier symptoms. Abd soft nt. No cva tenderness. Afebrile.   Po fluids.   Pt currently appears stable for d/c.   Requests  work note - provided.    Final Clinical Impression(s) / ED Diagnoses Final diagnoses:  None    Rx / DC Orders ED Discharge Orders     None        Lajean Saver, MD 04/03/21 1725

## 2021-03-31 ENCOUNTER — Ambulatory Visit
Admission: RE | Admit: 2021-03-31 | Discharge: 2021-03-31 | Disposition: A | Discharge status: Home / Self Care | Payer: 59 | Source: Ambulatory Visit | Attending: Family Medicine | Admitting: Family Medicine

## 2021-03-31 ENCOUNTER — Other Ambulatory Visit: Payer: Self-pay | Admitting: Family Medicine

## 2021-03-31 DIAGNOSIS — R109 Unspecified abdominal pain: Secondary | ICD-10-CM

## 2021-03-31 DIAGNOSIS — R509 Fever, unspecified: Secondary | ICD-10-CM

## 2021-03-31 DIAGNOSIS — R197 Diarrhea, unspecified: Secondary | ICD-10-CM

## 2021-03-31 DIAGNOSIS — K5730 Diverticulosis of large intestine without perforation or abscess without bleeding: Secondary | ICD-10-CM | POA: Diagnosis not present

## 2021-03-31 DIAGNOSIS — D259 Leiomyoma of uterus, unspecified: Secondary | ICD-10-CM | POA: Diagnosis not present

## 2021-03-31 MED ORDER — IOPAMIDOL (ISOVUE-300) INJECTION 61%
100.0000 mL | Freq: Once | INTRAVENOUS | Status: AC | PRN
  Administered 2021-03-31: 100 mL via INTRAVENOUS

## 2021-04-02 ENCOUNTER — Emergency Department (HOSPITAL_COMMUNITY): Payer: 59

## 2021-04-02 ENCOUNTER — Observation Stay (HOSPITAL_COMMUNITY)
Admission: EM | Admit: 2021-04-02 | Discharge: 2021-04-03 | Disposition: A | Discharge status: Home / Self Care | Payer: 59 | Attending: Emergency Medicine | Admitting: Emergency Medicine

## 2021-04-02 ENCOUNTER — Other Ambulatory Visit: Payer: Self-pay

## 2021-04-02 ENCOUNTER — Encounter (HOSPITAL_COMMUNITY): Payer: Self-pay

## 2021-04-02 DIAGNOSIS — Z794 Long term (current) use of insulin: Secondary | ICD-10-CM | POA: Diagnosis not present

## 2021-04-02 DIAGNOSIS — D72119 Hypereosinophilic syndrome (hes), unspecified: Principal | ICD-10-CM | POA: Insufficient documentation

## 2021-04-02 DIAGNOSIS — Z20822 Contact with and (suspected) exposure to covid-19: Secondary | ICD-10-CM | POA: Diagnosis not present

## 2021-04-02 DIAGNOSIS — N390 Urinary tract infection, site not specified: Secondary | ICD-10-CM | POA: Diagnosis not present

## 2021-04-02 DIAGNOSIS — E039 Hypothyroidism, unspecified: Secondary | ICD-10-CM | POA: Insufficient documentation

## 2021-04-02 DIAGNOSIS — Z7984 Long term (current) use of oral hypoglycemic drugs: Secondary | ICD-10-CM | POA: Diagnosis not present

## 2021-04-02 DIAGNOSIS — Z79899 Other long term (current) drug therapy: Secondary | ICD-10-CM | POA: Insufficient documentation

## 2021-04-02 DIAGNOSIS — R52 Pain, unspecified: Secondary | ICD-10-CM | POA: Diagnosis not present

## 2021-04-02 DIAGNOSIS — R21 Rash and other nonspecific skin eruption: Secondary | ICD-10-CM | POA: Diagnosis not present

## 2021-04-02 DIAGNOSIS — R5383 Other fatigue: Secondary | ICD-10-CM | POA: Diagnosis not present

## 2021-04-02 DIAGNOSIS — R059 Cough, unspecified: Secondary | ICD-10-CM

## 2021-04-02 DIAGNOSIS — R300 Dysuria: Secondary | ICD-10-CM | POA: Diagnosis not present

## 2021-04-02 DIAGNOSIS — R110 Nausea: Secondary | ICD-10-CM | POA: Diagnosis not present

## 2021-04-02 DIAGNOSIS — E119 Type 2 diabetes mellitus without complications: Secondary | ICD-10-CM | POA: Diagnosis not present

## 2021-04-02 DIAGNOSIS — E11311 Type 2 diabetes mellitus with unspecified diabetic retinopathy with macular edema: Secondary | ICD-10-CM

## 2021-04-02 DIAGNOSIS — R519 Headache, unspecified: Secondary | ICD-10-CM | POA: Diagnosis not present

## 2021-04-02 DIAGNOSIS — R109 Unspecified abdominal pain: Secondary | ICD-10-CM | POA: Diagnosis not present

## 2021-04-02 DIAGNOSIS — R509 Fever, unspecified: Secondary | ICD-10-CM | POA: Diagnosis not present

## 2021-04-02 DIAGNOSIS — R9431 Abnormal electrocardiogram [ECG] [EKG]: Secondary | ICD-10-CM | POA: Diagnosis not present

## 2021-04-02 DIAGNOSIS — E1165 Type 2 diabetes mellitus with hyperglycemia: Secondary | ICD-10-CM

## 2021-04-02 DIAGNOSIS — R6889 Other general symptoms and signs: Secondary | ICD-10-CM | POA: Diagnosis not present

## 2021-04-02 LAB — COMPREHENSIVE METABOLIC PANEL
ALT: 28 U/L (ref 0–44)
AST: 25 U/L (ref 15–41)
Albumin: 3.5 g/dL (ref 3.5–5.0)
Alkaline Phosphatase: 79 U/L (ref 38–126)
Anion gap: 11 (ref 5–15)
BUN: 13 mg/dL (ref 6–20)
CO2: 24 mmol/L (ref 22–32)
Calcium: 8.7 mg/dL — ABNORMAL LOW (ref 8.9–10.3)
Chloride: 103 mmol/L (ref 98–111)
Creatinine, Ser: 1.32 mg/dL — ABNORMAL HIGH (ref 0.44–1.00)
GFR, Estimated: 48 mL/min — ABNORMAL LOW (ref >60)
Glucose, Bld: 130 mg/dL — ABNORMAL HIGH (ref 70–99)
Potassium: 3.5 mmol/L (ref 3.5–5.1)
Sodium: 138 mmol/L (ref 135–145)
Total Bilirubin: 0.3 mg/dL (ref 0.3–1.2)
Total Protein: 7 g/dL (ref 6.5–8.1)

## 2021-04-02 LAB — URINALYSIS, ROUTINE W REFLEX MICROSCOPIC
Bilirubin Urine: NEGATIVE
Glucose, UA: NEGATIVE mg/dL
Hgb urine dipstick: NEGATIVE
Ketones, ur: 5 mg/dL — AB
Nitrite: NEGATIVE
Protein, ur: 30 mg/dL — AB
Specific Gravity, Urine: 1.03 (ref 1.005–1.030)
pH: 5 (ref 5.0–8.0)

## 2021-04-02 LAB — CBC WITH DIFFERENTIAL/PLATELET
Abs Immature Granulocytes: 0.05 10*3/uL (ref 0.00–0.07)
Basophils Absolute: 0 10*3/uL (ref 0.0–0.1)
Basophils Relative: 0 %
Eosinophils Absolute: 8.7 10*3/uL — ABNORMAL HIGH (ref 0.0–0.5)
Eosinophils Relative: 65 %
HCT: 38.8 % (ref 36.0–46.0)
Hemoglobin: 12.4 g/dL (ref 12.0–15.0)
Immature Granulocytes: 0 %
Lymphocytes Relative: 17 %
Lymphs Abs: 2.4 10*3/uL (ref 0.7–4.0)
MCH: 26.6 pg (ref 26.0–34.0)
MCHC: 32 g/dL (ref 30.0–36.0)
MCV: 83.1 fL (ref 80.0–100.0)
Monocytes Absolute: 0.7 10*3/uL (ref 0.1–1.0)
Monocytes Relative: 5 %
Neutro Abs: 1.7 10*3/uL (ref 1.7–7.7)
Neutrophils Relative %: 13 %
Platelets: 237 10*3/uL (ref 150–400)
RBC: 4.67 MIL/uL (ref 3.87–5.11)
RDW: 14 % (ref 11.5–15.5)
WBC: 13.6 10*3/uL — ABNORMAL HIGH (ref 4.0–10.5)
nRBC: 0 % (ref 0.0–0.2)

## 2021-04-02 LAB — RESP PANEL BY RT-PCR (FLU A&B, COVID) ARPGX2
Influenza A by PCR: NEGATIVE
Influenza B by PCR: NEGATIVE
SARS Coronavirus 2 by RT PCR: NEGATIVE

## 2021-04-02 MED ORDER — DIPHENHYDRAMINE HCL 50 MG/ML IJ SOLN
25.0000 mg | Freq: Once | INTRAMUSCULAR | Status: AC
  Administered 2021-04-03: 25 mg via INTRAVENOUS
  Filled 2021-04-02: qty 1

## 2021-04-02 MED ORDER — KETOROLAC TROMETHAMINE 30 MG/ML IJ SOLN
15.0000 mg | Freq: Once | INTRAMUSCULAR | Status: AC
  Administered 2021-04-03: 15 mg via INTRAVENOUS
  Filled 2021-04-02: qty 1

## 2021-04-02 MED ORDER — PROCHLORPERAZINE EDISYLATE 10 MG/2ML IJ SOLN
10.0000 mg | Freq: Once | INTRAMUSCULAR | Status: AC
  Administered 2021-04-03: 10 mg via INTRAVENOUS
  Filled 2021-04-02: qty 2

## 2021-04-02 NOTE — ED Provider Notes (Signed)
Emergency Medicine Provider Triage Evaluation Note  AKIRRA NOVACK , a 55 y.o. female  was evaluated in triage.  Pt complaints subjective fever, chills, body aches, nausea, and bilateral flank pain that is worse on the left side.  Also has had a mild cough.  States her urine looks abnormal at times but she is not having any dysuria, frequency, or urgency.  Seen by PCP 03/18/21- abx for UTI CT @ [facility] a couple of days ago.   Review of Systems  Positive: Fever, chills, body aches, nausea, flank pain, and cough Negative: Dysuria, frequency, hematuria, vomiting, chest pain, or dyspnea.  Physical Exam  BP 110/78 (BP Location: Left Arm)   Pulse (!) 108   Temp 97.9 F (36.6 C) (Oral)   Resp 16   LMP 07/10/2012   SpO2 100%  Gen:   Awake, no distress   Resp:  Normal effort  MSK:   Moves extremities without difficulty  Other:  No CVA tenderness.  No focal abdominal tenderness or peritoneal signs.  Medical Decision Making  Medically screening exam initiated at 5:01 PM.  Appropriate orders placed.  Shineka A Abrams was informed that the remainder of the evaluation will be completed by another provider, this initial triage assessment does not replace that evaluation, and the importance of remaining in the ED until their evaluation is complete.     Leafy Kindle 04/02/21 1709    Regan Lemming, MD 04/02/21 [phone]

## 2021-04-02 NOTE — ED Provider Notes (Signed)
[facility] DEPT Provider Note   CSN: [id number] Arrival date & time: 04/02/21  1617     History Chief Complaint  Patient presents with   Headache   Generalized Body Aches    Samantha [name] is a 55 y.o. female with history of diabetes mellitus and hypothyroidism who presents to the emergency department with a chief complaint of headache.  The patient reports that she has been having intermittent, throbbing, generalized headaches for the last 2 weeks.  No known aggravating or alleviating factors for her headaches.  She reports that she has been having worsening aching and cramping in her muscles and joints, particularly her bilateral shoulders and neck.  No neck stiffness.  She does have a history of headaches, but has never been diagnosed with migraines.  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  She wears contacts and had her eyes evaluated earlier this year.  She reports that almost 2 weeks ago that she was having nausea, vomiting, diarrhea, and upper abdominal pain.  She was seen at urgent care and diagnosed with a UTI.  She was started on antibiotics, but followed up with her PCP within a few days and was told that her urine was clearing up and that she should not finish the course of antibiotics.  However, she continued to feel poorly so presented to [facility] 7/1.  She was found to be febrile at 102 when she presented to the ED.  She denies any other recent fever, but has been endorsing chills.  Unfortunately, she left after waiting in the waiting room for 5 hours.   She followed up with her PCP who had an outpatient CT of her abdomen and pelvis performed, which was unremarkable.  However, she reports that she continues to have intermittent headaches, myalgias, and joint pain.  She has also noticed a red rash on her arms and legs, but is unsure how long it has been there.  Rash is not pruritic.  She is unsure if it has been  spreading.  She denies chest pain, shortness of breath, swollen joints, dysuria, hematuria, melena, hematochezia, epistaxis, spontaneous bleeding, sore throat.   She has not been hiking or outside for any significant amount of time.  She does have a dog with fleas, but has not noticed any ticks on the dog.  No known tick bites.  The history is provided by the patient and medical records. No language interpreter was used.      Past Medical History:  Diagnosis Date   Chest pain of uncertain etiology    Diabetes mellitus without complication ([place])    Type 2   Palpitations    Thyroid disease    Urticaria     Patient Active Problem List   Diagnosis Date Noted   Hypereosinophilic syndrome (hes), unspecified 04/03/2021   Hypereosinophilic syndrome 82/64/1583   Acute right eye pain 01/26/2020   Acute respiratory failure with hypoxia ([place]) 01/26/2020   Multifocal pneumonia 01/24/2020   Mixed hyperlipidemia 11/13/2018   Uncontrolled type 2 diabetes mellitus with hyperglycemia, with long-term current use of insulin ([place]) 06/10/2017   Hypothyroidism, postablative 06/24/2014    Past Surgical History:  Procedure Laterality Date   CESAREAN SECTION     UTERINE FIBROID EMBOLIZATION       OB History   No obstetric history on file.     Family History  Problem Relation Age of Onset   Dementia Mother  Diabetes Mother    Stroke Father     Social History   Tobacco Use   Smoking status: Never   Smokeless tobacco: Never  Vaping Use   Vaping Use: Never used  Substance Use Topics   Alcohol use: No    Alcohol/week: 0.0 standard drinks   Drug use: Never    Home Medications Prior to Admission medications   Medication Sig Start Date End Date Taking? Authorizing Provider  Blood Glucose Monitoring Suppl (FREESTYLE LITE) w/Device KIT USE TO CHECK BLOOD SUGAR 4 TIMES DAILY 08/19/20 08/19/21  Philemon Kingdom, MD  cetirizine (ZYRTEC) 10 MG tablet Take 1 tablet twice daily as needed  08/01/18   Kennith Gain, MD  Continuous Blood Gluc Sensor (DEXCOM G6 SENSOR) MISC Inject 1 Device into the skin continuous for 10 days. 01/12/21 04/17/21  Philemon Kingdom, MD  Continuous Blood Gluc Transmit (DEXCOM G6 TRANSMITTER) MISC Use every 3 (three) months. 01/12/21   Philemon Kingdom, MD  fluticasone [facility] 50 MCG/ACT nasal spray USE 2 SPRAYS IN [facility] NOSTRIL ONCE A DAY 10/25/20 10/25/21  Harlan Stains, MD  glucose blood (ONETOUCH VERIO) test strip Use as instructed to check blood sugar daily 03/11/20   Philemon Kingdom, MD  glucose blood test strip USE TO CHECK BLOOD SUGAR 4 TIMES DAILY Patient taking differently: USE TO CHECK BLOOD SUGAR 4 TIMES DAILY 08/19/20 08/19/21  Philemon Kingdom, MD  ibuprofen (ADVIL,MOTRIN) 800 MG tablet Take 800 mg by mouth every 8 (eight) hours as needed for mild pain or moderate pain.     [provider]  Insulin Glargine-yfgn (SEMGLEE, YFGN,) 100 UNIT/ML SOLN Inject 40 Units into the skin in the morning and at bedtime. 01/13/21   Philemon Kingdom, MD  Insulin Glargine-yfgn (SEMGLEE, YFGN,) 100 UNIT/ML SOPN Inject 40 units into the skin every morning and 40 units every evening at bedtime 01/13/21   Philemon Kingdom, MD  insulin lispro (HUMALOG) 200 UNIT/ML KwikPen INJECT UNDER SKIN 20-25 UNITS BEFORE EVERY MEAL, AND 10-15 UNITS BEFORE A SNACK (UP TO 100 UNITS A DAY). 09/20/20 09/20/21  Philemon Kingdom, MD  Insulin Pen Needle (UNIFINE PENTIPS) 32G X 4 MM MISC Use 5 times a day 01/12/21   Philemon Kingdom, MD  Insulin Pen Needle 32G X 4 MM MISC USE TO INJECT INSULIN 12/28/20 12/28/21  Philemon Kingdom, MD  Lancets (FREESTYLE) lancets USE TO CHECK BLOOD SUGAR 4 TIMES DAILY Patient taking differently: USE TO CHECK BLOOD SUGAR 4 TIMES DAILY 08/19/20 08/19/21  Philemon Kingdom, MD  levothyroxine (SYNTHROID) 100 MCG tablet TAKE 1 TABLET BY MOUTH DAILY 02/14/21 02/14/22  Philemon Kingdom, MD  loratadine (CLARITIN) 10 MG tablet TAKE 1 TABLET BY MOUTH ONCE A  DAY 10/25/20 10/25/21  Harlan Stains, MD  metFORMIN (GLUCOPHAGE XR) 500 MG 24 hr tablet Take 2 tablets (1,000 mg total) by mouth 2 (two) times daily with a meal. 01/12/21   Philemon Kingdom, MD  omeprazole (PRILOSEC) 40 MG capsule Take 40 mg by mouth daily.     [provider]  omeprazole (PRILOSEC) 40 MG capsule Take 1 capsule (40 mg total) by mouth 2 (two) times daily as needed. 12/15/20     ondansetron (ZOFRAN-ODT) 4 MG disintegrating tablet Dissolve 1 tablet by mouth 3 times daily for 3 days 4/00/86     OneTouch Delica Lancets 76P MISC Use to check blood sugar daily 03/15/20   Philemon Kingdom, MD  rosuvastatin (CRESTOR) 5 MG tablet Take 1 tablet (5 mg total) by mouth at bedtime. Patient taking differently: Take 5  mg by mouth daily. 10/26/16   Philemon Kingdom, MD  rosuvastatin (CRESTOR) 5 MG tablet Take 1 tablet (5 mg total) by mouth daily. 12/28/20     Semaglutide,0.25 or 0.5MG/DOS, (OZEMPIC, 0.25 OR 0.5 MG/DOSE,) 2 MG/1.5ML SOPN Inject 0.5 mg into the skin once a week. 01/12/21   Philemon Kingdom, MD  sertraline (ZOLOFT) 100 MG tablet Take 100 mg by mouth daily. 01/14/20   [provider]  sertraline (ZOLOFT) 100 MG tablet Take 1 tablet (100 mg total) by mouth daily. 12/28/20     triamcinolone (NASACORT) 55 MCG/ACT AERO nasal inhaler Place 2 sprays into each nostril once daily. 12/15/20     VITAMIN D, ERGOCALCIFEROL, PO Take 1 capsule by mouth daily.    [provider]    Allergies    Bactrim [sulfamethoxazole-trimethoprim], Bupropion, Lexapro [escitalopram], Augmentin [amoxicillin-pot clavulanate], Cyclobenzaprine, Pravastatin, Atorvastatin, and Doxepin hcl  Review of Systems   Review of Systems  Constitutional:  Negative for activity change, chills, diaphoresis and fever.  HENT:  Negative for congestion, drooling, ear pain, sneezing, sore throat and voice change.   Eyes:  Negative for visual disturbance.  Respiratory:  Negative for cough, shortness of breath and  wheezing.   Cardiovascular:  Negative for chest pain and palpitations.  Gastrointestinal:  Positive for abdominal pain (resolved), diarrhea (resolved), nausea and vomiting (resolved). Negative for constipation and rectal pain.  Genitourinary:  Positive for flank pain. Negative for dysuria.  Musculoskeletal:  Positive for arthralgias and myalgias. Negative for back pain, gait problem, neck pain and neck stiffness.  Skin:  Negative for color change, rash and wound.  Allergic/Immunologic: Negative for immunocompromised state.  Neurological:  Positive for weakness, light-headedness and headaches. Negative for dizziness, seizures, syncope, speech difficulty and numbness.  Psychiatric/Behavioral:  Negative for confusion.    Physical Exam Updated Vital Signs BP (!) 95/58 (BP Location: Left Arm)   Pulse 71   Temp 98.5 F (36.9 C) (Oral)   Resp 15   LMP 07/10/2012   SpO2 97%   Physical Exam Vitals and nursing note reviewed.  Constitutional:      General: She is not in acute distress.    Appearance: She is not ill-appearing, toxic-appearing or diaphoretic.  HENT:     Head: Normocephalic.  Eyes:     Extraocular Movements: Extraocular movements intact.     Conjunctiva/sclera: Conjunctivae normal.     Pupils: Pupils are equal, round, and reactive to light.  Cardiovascular:     Rate and Rhythm: Normal rate and regular rhythm.     Heart sounds: No murmur heard.   No friction rub. No gallop.  Pulmonary:     Effort: Pulmonary effort is normal. No respiratory distress.     Breath sounds: No stridor. No wheezing, rhonchi or rales.  Chest:     Chest wall: No tenderness.  Abdominal:     General: There is no distension.     Palpations: Abdomen is soft.  Musculoskeletal:     Cervical back: Neck supple.  Skin:    General: Skin is warm.     Findings: No rash.  Neurological:     Mental Status: She is alert.     Comments: Alert and oriented x4.  Cranial nerves II through XII are grossly  intact.  Finger-nose is intact bilaterally.  Heel-to-shin is intact bilaterally.  Sensation is intact and equal throughout.  Good strength against resistance of the bilateral upper and lower extremities.  Good grip strength bilaterally.  Good strength against resistance with dorsiflexion  plantarflexion.  Gait is not ataxic.  Psychiatric:        Behavior: Behavior normal.    ED Results / Procedures / Treatments   Labs (all labs ordered are listed, but only abnormal results are displayed) Labs Reviewed  COMPREHENSIVE METABOLIC PANEL - Abnormal; Notable for the following components:      Result Value   Glucose, Bld 130 (*)    Creatinine, Ser 1.32 (*)    Calcium 8.7 (*)    GFR, Estimated 48 (*)    All other components within normal limits  CBC WITH DIFFERENTIAL/PLATELET - Abnormal; Notable for the following components:   WBC 13.6 (*)    Eosinophils Absolute 8.7 (*)    All other components within normal limits  URINALYSIS, ROUTINE W REFLEX MICROSCOPIC - Abnormal; Notable for the following components:   APPearance HAZY (*)    Ketones, ur 5 (*)    Protein, ur 30 (*)    Leukocytes,Ua TRACE (*)    Bacteria, UA RARE (*)    All other components within normal limits  SEDIMENTATION RATE - Abnormal; Notable for the following components:   Sed Rate 24 (*)    All other components within normal limits  C-REACTIVE PROTEIN - Abnormal; Notable for the following components:   CRP 2.2 (*)    All other components within normal limits  CBC - Abnormal; Notable for the following components:   WBC 12.8 (*)    Hemoglobin 11.5 (*)    HCT 35.6 (*)    All other components within normal limits  BASIC METABOLIC PANEL - Abnormal; Notable for the following components:   Potassium 3.2 (*)    Glucose, Bld 111 (*)    Creatinine, Ser 1.03 (*)    Calcium 8.1 (*)    All other components within normal limits  RESP PANEL BY RT-PCR (FLU A&B, COVID) ARPGX2  CK  LIPASE, BLOOD  SAVE SMEAR (SSMR)  PATHOLOGIST SMEAR  REVIEW  TRYPTASE  RHEUMATOID FACTOR  ANA  C3 COMPLEMENT  C4 COMPLEMENT  ANCA TITERS  HIV ANTIBODY (ROUTINE TESTING W REFLEX)  HEMOGLOBIN A1C  TROPONIN I (HIGH SENSITIVITY)  TROPONIN I (HIGH SENSITIVITY)    EKG EKG Interpretation  Date/Time:  Monday April 03 2021 04:55:45 EDT Ventricular Rate:  71 PR Interval:  186 QRS Duration: 90 QT Interval:  423 QTC Calculation: 460 R Axis:   6 Text Interpretation: Sinus rhythm Low voltage, precordial leads No previous ECGs available Confirmed by Shanon Rosser [phone]) on 04/03/2021 5:11:26 AM  Radiology DG Chest 1 View  Result Date: 04/02/2021 CLINICAL DATA:  Headache, body aches, mild cough. Recent diagnosis of urinary tract infection. Nausea and flank pain. EXAM: CHEST  1 VIEW COMPARISON:  02/11/2020 FINDINGS: The heart size and mediastinal contours are within normal limits. Both lungs are clear. The visualized skeletal structures are unremarkable. IMPRESSION: No active disease. Electronically Signed   By: Lucienne Capers M.D.   On: 04/02/2021 17:57   CT Head Wo Contrast  Result Date: 04/03/2021 CLINICAL DATA:  Headache EXAM: CT HEAD WITHOUT CONTRAST TECHNIQUE: Contiguous axial images were obtained from the base of the skull through the vertex without intravenous contrast. COMPARISON:  None. FINDINGS: Brain: There is no mass, hemorrhage or extra-axial collection. The size and configuration of the ventricles and extra-axial CSF spaces are normal. The brain parenchyma is normal, without acute or chronic infarction. Vascular: No abnormal hyperdensity of the major intracranial arteries or dural venous sinuses. No intracranial atherosclerosis. Skull: The visualized skull base, calvarium and  extracranial soft tissues are normal. Sinuses/Orbits: No fluid levels or advanced mucosal thickening of the visualized paranasal sinuses. No mastoid or middle ear effusion. The orbits are normal. IMPRESSION: Normal head CT. Electronically Signed   By: Ulyses Jarred  M.D.   On: 04/03/2021 01:28    Procedures .Critical Care  Date/Time: 04/03/2021 5:44 AM Performed by: Joanne Gavel, PA-C Authorized by: Joanne Gavel, PA-C   Critical care provider statement:    Critical care time (minutes):  65   Critical care time was exclusive of:  Separately billable procedures and treating other patients and teaching time   Critical care was necessary to treat or prevent imminent or life-threatening deterioration of the following conditions: DRESS syndrome.   Critical care was time spent personally by me on the following activities:  Ordering and performing treatments and interventions, ordering and review of laboratory studies, ordering and review of radiographic studies, pulse oximetry, re-evaluation of patient's condition, review of old charts, obtaining history from patient or surrogate, evaluation of patient's response to treatment, examination of patient, discussions with consultants and development of treatment plan with patient or surrogate   I assumed direction of critical care for this patient from another provider in my specialty: no     Care discussed with: admitting provider     Medications Ordered in ED Medications  predniSONE (DELTASONE) tablet 50 mg (has no administration in time range)  enoxaparin (LOVENOX) injection 40 mg (has no administration in time range)  acetaminophen (TYLENOL) tablet 650 mg (has no administration in time range)    Or  acetaminophen (TYLENOL) suppository 650 mg (has no administration in time range)  ondansetron (ZOFRAN) tablet 4 mg (has no administration in time range)    Or  ondansetron (ZOFRAN) injection 4 mg (has no administration in time range)  insulin glargine-yfgn SOLN 40 Units (has no administration in time range)  rosuvastatin (CRESTOR) tablet 5 mg (has no administration in time range)  sertraline (ZOLOFT) tablet 100 mg (has no administration in time range)  pantoprazole (PROTONIX) EC tablet 80 mg (has no  administration in time range)  levothyroxine (SYNTHROID) tablet 100 mcg (has no administration in time range)  insulin aspart (novoLOG) injection 0-20 Units (has no administration in time range)  insulin aspart (novoLOG) injection 10 Units (has no administration in time range)  ketorolac (TORADOL) 30 MG/ML injection 15 mg (15 mg Intravenous Given 04/03/21 0005)  diphenhydrAMINE (BENADRYL) injection 25 mg (25 mg Intravenous Given 04/03/21 0008)  prochlorperazine (COMPAZINE) injection 10 mg (10 mg Intravenous Given 04/03/21 0006)    ED Course  I have reviewed the triage vital signs and the nursing notes.  Pertinent labs & imaging results that were available during my care of the patient were reviewed by me and considered in my medical decision making (see chart for details).  Clinical Course as of 04/03/21 0553  Mon Apr 03, 2021  0351 Spoke with Dr. Verdie Mosher, dermatology at [facility], regarding the patient.  Drip score calculated and patient's score is 3, which indicates moderate risk for her symptoms being related to dress.  Since patient did have an AKI, she would not recommend discharging the patient home with outpatient follow-up.  She recommends starting the patient on corticosteroids at 0.5-1 mg/kg/day.  She will likely need to be on an 8 to 12-week taper.  Unfortunately, [facility] is not currently accepting any patients as transfers.  They are not also accepting any ED to ED transfers.  After speaking with dermatology,  I did discuss with the hospitalist at [facility] who is agreeable to excepting the patient for admission for management. [MM]    Clinical Course User Index [MM] Cybele Maule, Laymond Purser, PA-C   MDM Rules/Calculators/A&P                           55 year old female with history of diabetes mellitus and hypothyroidism who presents the emergency department with generalized weakness and headache for the last 2 weeks.  She was found to have a maculopapular petechial  rash on exam diffusely to the upper and lower extremities and trunk.  She had an ED visit 3 days ago where she was febrile at 102 at that time, but afebrile today in the emergency department.  States that her symptoms began around 2 weeks ago when she presented to an urgent care with nausea, vomiting, diarrhea, abdominal pain and was treated with a 5-day course of Bactrim for a UTI.  Labs and imaging of been reviewed independently interpreted by me.  He has a new eosinophilia with an absolute eosinophil count of 8.7.  No history of previous elevation.  There are also reactive benign lymphocytes noted on her CBC.  She has a mild leukocytosis of 13.6.  Patient also has a new AKI today.  Creatinine is 1.32.  Her baseline creatinine is 0.7.  No other significant metabolic derangements.  Given myalgias, CK level was obtained and is not elevated.  We will add on CRP and ESR.  She later added that she was having some upper abdominal pain and troponin and lipase have been added on.  Chest x-ray is unremarkable.  Given her age with new headaches, CT head was obtained and was unremarkable.  Since she did have a fever and has a rash, I did consider [facility] spotted fever.  However, given her new eosinophilia, there is a strong suspicion for dress, which is more concerning since she has a new AKI.  I also considered eosinophilic granulomatosis with polyangiitis.  The patient was discussed with Dr. Florina Ou, attending physician.  IV fluid bolus and migraine cocktail given in the ED.  After discussing the patient with the dermatology consult at outside facility, the patient was recommended for admission as the risk for her symptoms being due to dress using the [facility] system was 3, indicating probable diagnosis.  Discussed the patient with Dr. Alcario Drought, attending physician.  He will accept the patient for admission. The patient appears reasonably stabilized for admission considering the current resources, flow, and  capabilities available in the ED at this time, and I doubt any other [facility] requiring further screening and/or treatment in the ED prior to admission.   Final Clinical Impression(s) / ED Diagnoses Final diagnoses:  Hypereosinophilic syndrome, unspecified type  Rash and nonspecific skin eruption    Rx / DC Orders ED Discharge Orders     None        Britini Garcilazo A, PA-C 04/03/21 0553    Molpus, John, MD 04/03/21 0700

## 2021-04-02 NOTE — ED Triage Notes (Addendum)
Pt c/o headache and body aches since 7/15.  Pain score 7/10.  Pt was recently diagnosed w/ a UTI and completed antibiotics.  Pt reports she was seen at UC earlier and sent to ED d/t "an infection."   After triage, Pt reported to PA that she is having nausea and flank pain.

## 2021-04-03 ENCOUNTER — Emergency Department (HOSPITAL_COMMUNITY): Payer: 59

## 2021-04-03 ENCOUNTER — Other Ambulatory Visit (HOSPITAL_COMMUNITY): Payer: Self-pay

## 2021-04-03 DIAGNOSIS — E1165 Type 2 diabetes mellitus with hyperglycemia: Secondary | ICD-10-CM

## 2021-04-03 DIAGNOSIS — Z7984 Long term (current) use of oral hypoglycemic drugs: Secondary | ICD-10-CM | POA: Diagnosis not present

## 2021-04-03 DIAGNOSIS — R21 Rash and other nonspecific skin eruption: Secondary | ICD-10-CM

## 2021-04-03 DIAGNOSIS — E039 Hypothyroidism, unspecified: Secondary | ICD-10-CM | POA: Diagnosis not present

## 2021-04-03 DIAGNOSIS — R519 Headache, unspecified: Secondary | ICD-10-CM | POA: Diagnosis not present

## 2021-04-03 DIAGNOSIS — Z794 Long term (current) use of insulin: Secondary | ICD-10-CM

## 2021-04-03 DIAGNOSIS — D72118 Other hypereosinophilic syndrome: Secondary | ICD-10-CM | POA: Diagnosis not present

## 2021-04-03 DIAGNOSIS — Z20822 Contact with and (suspected) exposure to covid-19: Secondary | ICD-10-CM | POA: Diagnosis not present

## 2021-04-03 DIAGNOSIS — D72119 Hypereosinophilic syndrome (hes), unspecified: Secondary | ICD-10-CM | POA: Diagnosis not present

## 2021-04-03 DIAGNOSIS — Z79899 Other long term (current) drug therapy: Secondary | ICD-10-CM | POA: Diagnosis not present

## 2021-04-03 DIAGNOSIS — E119 Type 2 diabetes mellitus without complications: Secondary | ICD-10-CM | POA: Diagnosis not present

## 2021-04-03 LAB — BASIC METABOLIC PANEL
Anion gap: 11 (ref 5–15)
BUN: 15 mg/dL (ref 6–20)
CO2: 22 mmol/L (ref 22–32)
Calcium: 8.1 mg/dL — ABNORMAL LOW (ref 8.9–10.3)
Chloride: 104 mmol/L (ref 98–111)
Creatinine, Ser: 1.03 mg/dL — ABNORMAL HIGH (ref 0.44–1.00)
GFR, Estimated: >60 mL/min (ref >60)
Glucose, Bld: 111 mg/dL — ABNORMAL HIGH (ref 70–99)
Potassium: 3.2 mmol/L — ABNORMAL LOW (ref 3.5–5.1)
Sodium: 137 mmol/L (ref 135–145)

## 2021-04-03 LAB — DIFFERENTIAL
Abs Immature Granulocytes: 0.05 10*3/uL (ref 0.00–0.07)
Basophils Absolute: 0 10*3/uL (ref 0.0–0.1)
Basophils Relative: 0 %
Eosinophils Absolute: 8.6 10*3/uL — ABNORMAL HIGH (ref 0.0–0.5)
Eosinophils Relative: 65 %
Immature Granulocytes: 0 %
Lymphocytes Relative: 20 %
Lymphs Abs: 2.7 10*3/uL (ref 0.7–4.0)
Monocytes Absolute: 0.8 10*3/uL (ref 0.1–1.0)
Monocytes Relative: 6 %
Neutro Abs: 1.1 10*3/uL — ABNORMAL LOW (ref 1.7–7.7)
Neutrophils Relative %: 9 %

## 2021-04-03 LAB — TROPONIN I (HIGH SENSITIVITY)
Troponin I (High Sensitivity): 3 ng/L (ref <18)
Troponin I (High Sensitivity): 3 ng/L (ref <18)

## 2021-04-03 LAB — PATHOLOGIST SMEAR REVIEW

## 2021-04-03 LAB — CBG MONITORING, ED
Glucose-Capillary: 95 mg/dL (ref 70–99)
Glucose-Capillary: 134 mg/dL — ABNORMAL HIGH (ref 70–99)

## 2021-04-03 LAB — CBC
HCT: 35.6 % — ABNORMAL LOW (ref 36.0–46.0)
Hemoglobin: 11.5 g/dL — ABNORMAL LOW (ref 12.0–15.0)
MCH: 26.2 pg (ref 26.0–34.0)
MCHC: 32.3 g/dL (ref 30.0–36.0)
MCV: 81.1 fL (ref 80.0–100.0)
Platelets: 219 10*3/uL (ref 150–400)
RBC: 4.39 MIL/uL (ref 3.87–5.11)
RDW: 14 % (ref 11.5–15.5)
WBC: 12.8 10*3/uL — ABNORMAL HIGH (ref 4.0–10.5)
nRBC: 0 % (ref 0.0–0.2)

## 2021-04-03 LAB — CK: Total CK: 120 U/L (ref 38–234)

## 2021-04-03 LAB — HIV ANTIBODY (ROUTINE TESTING W REFLEX): HIV Screen 4th Generation wRfx: NONREACTIVE

## 2021-04-03 LAB — LIPASE, BLOOD: Lipase: 23 U/L (ref 11–51)

## 2021-04-03 LAB — SEDIMENTATION RATE: Sed Rate: 24 mm/hr — ABNORMAL HIGH (ref 0–22)

## 2021-04-03 LAB — C-REACTIVE PROTEIN: CRP: 2.2 mg/dL — ABNORMAL HIGH (ref <1.0)

## 2021-04-03 LAB — SAVE SMEAR(SSMR), FOR PROVIDER SLIDE REVIEW

## 2021-04-03 MED ORDER — INSULIN ASPART 100 UNIT/ML IJ SOLN
0.0000 [IU] | Freq: Three times a day (TID) | INTRAMUSCULAR | Status: DC
  Administered 2021-04-03: 3 [IU] via SUBCUTANEOUS
  Filled 2021-04-03: qty 0.2

## 2021-04-03 MED ORDER — INSULIN GLARGINE-YFGN 100 UNIT/ML ~~LOC~~ SOLN: 40.0000 [IU] | Freq: Two times a day (BID) | SUBCUTANEOUS | Status: DC

## 2021-04-03 MED ORDER — PREDNISONE 50 MG PO TABS
50.0000 mg | ORAL_TABLET | Freq: Every day | ORAL | Status: DC
  Administered 2021-04-03: 50 mg via ORAL
  Filled 2021-04-03 (×2): qty 1

## 2021-04-03 MED ORDER — ONDANSETRON HCL 4 MG/2ML IJ SOLN: 4.0000 mg | Freq: Four times a day (QID) | INTRAMUSCULAR | Status: DC | PRN

## 2021-04-03 MED ORDER — ACETAMINOPHEN 650 MG RE SUPP: 650.0000 mg | Freq: Four times a day (QID) | RECTAL | Status: DC | PRN

## 2021-04-03 MED ORDER — POTASSIUM CHLORIDE CRYS ER 20 MEQ PO TBCR
30.0000 meq | EXTENDED_RELEASE_TABLET | Freq: Once | ORAL | Status: AC
  Administered 2021-04-03: 30 meq via ORAL
  Filled 2021-04-03: qty 1

## 2021-04-03 MED ORDER — PREDNISONE 10 MG PO TABS
ORAL_TABLET | ORAL | 0 refills | Status: DC
  Filled 2021-04-03: qty 120, 28d supply, fill #0

## 2021-04-03 MED ORDER — PANTOPRAZOLE SODIUM 40 MG PO TBEC
80.0000 mg | DELAYED_RELEASE_TABLET | Freq: Every day | ORAL | Status: DC
  Administered 2021-04-03: 80 mg via ORAL
  Filled 2021-04-03: qty 2

## 2021-04-03 MED ORDER — ROSUVASTATIN CALCIUM 5 MG PO TABS
5.0000 mg | ORAL_TABLET | Freq: Every day | ORAL | Status: DC
  Administered 2021-04-03: 5 mg via ORAL
  Filled 2021-04-03: qty 1

## 2021-04-03 MED ORDER — ENOXAPARIN SODIUM 40 MG/0.4ML IJ SOSY
40.0000 mg | PREFILLED_SYRINGE | INTRAMUSCULAR | Status: DC
  Administered 2021-04-03: 40 mg via SUBCUTANEOUS
  Filled 2021-04-03: qty 0.4

## 2021-04-03 MED ORDER — ONDANSETRON HCL 4 MG PO TABS: 4.0000 mg | ORAL_TABLET | Freq: Four times a day (QID) | ORAL | Status: DC | PRN

## 2021-04-03 MED ORDER — INSULIN GLARGINE 100 UNIT/ML ~~LOC~~ SOLN
40.0000 [IU] | Freq: Two times a day (BID) | SUBCUTANEOUS | Status: DC
  Administered 2021-04-03: 40 [IU] via SUBCUTANEOUS
  Filled 2021-04-03: qty 0.4

## 2021-04-03 MED ORDER — INSULIN ASPART 100 UNIT/ML IJ SOLN
10.0000 [IU] | Freq: Three times a day (TID) | INTRAMUSCULAR | Status: DC
  Administered 2021-04-03: 10 [IU] via SUBCUTANEOUS
  Filled 2021-04-03: qty 0.1

## 2021-04-03 MED ORDER — LEVOTHYROXINE SODIUM 100 MCG PO TABS
100.0000 ug | ORAL_TABLET | Freq: Every day | ORAL | Status: DC
  Administered 2021-04-03: 100 ug via ORAL
  Filled 2021-04-03: qty 1

## 2021-04-03 MED ORDER — ACETAMINOPHEN 325 MG PO TABS: 650.0000 mg | ORAL_TABLET | Freq: Four times a day (QID) | ORAL | Status: DC | PRN

## 2021-04-03 MED ORDER — SERTRALINE HCL 50 MG PO TABS
100.0000 mg | ORAL_TABLET | Freq: Every day | ORAL | Status: DC
  Administered 2021-04-03: 100 mg via ORAL
  Filled 2021-04-03: qty 2

## 2021-04-03 NOTE — Discharge Summary (Addendum)
[facility]  Physician Discharge Summary   Patient ID: Samantha [name] MRN: [id number] DOB/AGE: March 19, 1966 55 y.o.  Admit date: 04/02/2021 Discharge date: 04/03/2021    PCP: Harlan Stains, MD  DISCHARGE DIAGNOSES:  Concern for DRESS, precipitated by Bactrim Diabetes mellitus type 2 Hypothyroidism Hyperlipidemia   RECOMMENDATIONS FOR OUTPATIENT FOLLOW UP: Outpatient follow-up with PCP within a few days Results of autoimmune panel, as mentioned below, still pending.   Home Health: None Equipment/Devices: None  CODE STATUS: Full code  DISCHARGE CONDITION: fair  Diet recommendation: Modified carbohydrate  INITIAL HISTORY: 55 y.o. female with medical history significant of DM2, hypothyroidism. Pt has had intermittent headache for past 2 weeks. She reports that almost 2 weeks ago that she was having nausea, vomiting, diarrhea, and upper abdominal pain.  She was seen at urgent care and diagnosed with a UTI.  She was started on bactrim. Pt continued to feel poorly so presented to [facility] on 7/21. Found to be febrile at 102, UA neg, left without being seen after being in waiting room for 5h, no blood work drawn at that time. She followed up with her PCP who had an outpatient CT of her abdomen and pelvis performed, which was unremarkable.  However, she reports that she continues to have intermittent headaches, myalgias, and joint pain.  She has also noticed a red rash on her arms and legs.  She tells me the rash is new over the past couple of days.  Rash is not pruritic.  She is unsure if it has been spreading.  Found to have eosinophilia on blood work.  Concern was for DRESS.  ED providers discussed with dermatology in [facility] who recommended starting her on steroids.  Consultations: ED providers discussed with dermatology at [place]:   Concern for DRESS Likely triggered by recent Bactrim use for UTI.  Autoimmune  work-up has been initiated at the time of admission including ANCA, ANA, RF, C3, C4, CRP, ESR, tryptase. Lipase was normal.  CRP 2.2.  ESR 24.   Other work-up is still pending.   HIV nonreactive.  Patient started on steroids after discussions with dermatology in [facility]. Rash appears to be better.    Patient requested to be discharged in the afternoon since she was feeling better.  She ambulated in the hallway without difficulty.  She was discharged. She was told to follow-up with PCP within a few days.  Her other medical issues remained stable.    She was stable for discharge home.     PERTINENT LABS:  The results of significant diagnostics from this hospitalization (including imaging, microbiology, ancillary and laboratory) are listed below for reference.    Microbiology: Recent Results (from the past 240 hour(s))  Resp Panel by RT-PCR (Flu A&B, Covid) Nasopharyngeal Swab     Status: None   Collection Time: 04/02/21  9:09 PM   Specimen: Nasopharyngeal Swab; Nasopharyngeal(NP) swabs in vial transport medium  Result Value Ref Range Status   SARS Coronavirus 2 by RT PCR NEGATIVE NEGATIVE Final    Comment: (NOTE) SARS-CoV-2 target nucleic acids are NOT DETECTED.  The SARS-CoV-2 RNA is generally detectable in upper respiratory specimens during the acute phase of infection. The lowest concentration of SARS-CoV-2 viral copies this assay can detect is 138 copies/mL. A negative result does not preclude SARS-Cov-2 infection and should not be used as the sole basis for treatment or other patient management decisions. A negative result may occur with  improper specimen collection/handling, submission of specimen other than nasopharyngeal swab, presence of viral mutation(s) within the areas targeted by this assay, and inadequate number of viral copies(<138 copies/mL). A negative result must be combined with clinical observations, patient history, and  epidemiological information. The expected result is Negative.  Fact Sheet for Patients:  EntrepreneurPulse.com.au  Fact Sheet for Healthcare Providers:  IncredibleEmployment.be  This test is no t yet approved or cleared by the [place] FDA and  has been authorized for detection and/or diagnosis of SARS-CoV-2 by FDA under an Emergency Use Authorization (EUA). This EUA will remain  in effect (meaning this test can be used) for the duration of the COVID-19 declaration under Section 564(b)(1) of the Act, 21 U.S.C.section 360bbb-3(b)(1), unless the authorization is terminated  or revoked sooner.       Influenza A by PCR NEGATIVE NEGATIVE Final   Influenza B by PCR NEGATIVE NEGATIVE Final    Comment: (NOTE) The Xpert Xpress SARS-CoV-2/FLU/RSV plus assay is intended as an aid in the diagnosis of influenza from Nasopharyngeal swab specimens and should not be used as a sole basis for treatment. Nasal washings and aspirates are unacceptable for Xpert Xpress SARS-CoV-2/FLU/RSV testing.  Fact Sheet for Patients: EntrepreneurPulse.com.au  Fact Sheet for Healthcare Providers: IncredibleEmployment.be  This test is not yet approved or cleared by the [place] FDA and has been authorized for detection and/or diagnosis of SARS-CoV-2 by FDA under an Emergency Use Authorization (EUA). This EUA will remain in effect (meaning this test can be used) for the duration of the COVID-19 declaration under Section 564(b)(1) of the Act, 21 U.S.C. section 360bbb-3(b)(1), unless the authorization is terminated or revoked.  Performed at [facility], [place] [address] [place], [place] [address]      Labs:  COVID-19 Labs  Recent Labs    04/03/21 0349  CRP 2.2*    Lab Results  Component Value Date   SARSCOV2NAA NEGATIVE 04/02/2021   [place] NEGATIVE 01/24/2020      Basic Metabolic  Panel: Recent Labs  Lab 04/02/21 1713 04/03/21 0349  NA 138 137  K 3.5 3.2*  CL 103 104  CO2 24 22  GLUCOSE 130* 111*  BUN 13 15  CREATININE 1.32* 1.03*  CALCIUM 8.7* 8.1*   Liver Function Tests: Recent Labs  Lab 04/02/21 1713  AST 25  ALT 28  ALKPHOS 79  BILITOT 0.3  PROT 7.0  ALBUMIN 3.5   Recent Labs  Lab 04/03/21 0349  LIPASE 23    CBC: Recent Labs  Lab 04/02/21 1713 04/03/21 0349  WBC 13.6* 12.8*  NEUTROABS 1.7 1.1*  HGB 12.4 11.5*  HCT 38.8 35.6*  MCV 83.1 81.1  PLT 237 219   Cardiac Enzymes: Recent Labs  Lab 04/03/21 0005  CKTOTAL 120     CBG: Recent Labs  Lab 04/03/21 0805 04/03/21 1204  GLUCAP 95 134*     IMAGING STUDIES DG Chest 1 View  Result Date: 04/02/2021 CLINICAL DATA:  Headache, body aches, mild cough. Recent diagnosis of urinary tract infection. Nausea and flank pain. EXAM: CHEST  1 VIEW COMPARISON:  02/11/2020 FINDINGS: The heart size and mediastinal contours are within normal limits. Both lungs are clear. The visualized skeletal structures are unremarkable. IMPRESSION: No active disease. Electronically Signed   By: Lucienne Capers M.D.   On: 04/02/2021 17:57   CT Head Wo Contrast  Result Date: 04/03/2021 CLINICAL DATA:  Headache EXAM: CT HEAD WITHOUT CONTRAST TECHNIQUE: Contiguous axial images were obtained from the base  of the skull through the vertex without intravenous contrast. COMPARISON:  None. FINDINGS: Brain: There is no mass, hemorrhage or extra-axial collection. The size and configuration of the ventricles and extra-axial CSF spaces are normal. The brain parenchyma is normal, without acute or chronic infarction. Vascular: No abnormal hyperdensity of the major intracranial arteries or dural venous sinuses. No intracranial atherosclerosis. Skull: The visualized skull base, calvarium and extracranial soft tissues are normal. Sinuses/Orbits: No fluid levels or advanced mucosal thickening of the visualized paranasal sinuses.  No mastoid or middle ear effusion. The orbits are normal. IMPRESSION: Normal head CT. Electronically Signed   By: Ulyses Jarred M.D.   On: 04/03/2021 01:28   CT ABDOMEN PELVIS W CONTRAST  Result Date: 03/31/2021 CLINICAL DATA:  Abdominal pain, diarrhea, UTI for 1 week, fever to 102 degrees, LEFT lower quadrant pain question diverticulitis, history of uterine fibroids, Caesarean section EXAM: CT ABDOMEN AND PELVIS WITH CONTRAST TECHNIQUE: Multidetector CT imaging of the abdomen and pelvis was performed using the standard protocol following bolus administration of intravenous contrast. Sagittal and coronal MPR images reconstructed from axial data set. Patient vomited during contrast administration and patient was reinjected and re-scanned. CONTRAST:  149m ISOVUE-300 IOPAMIDOL (ISOVUE-300) INJECTION 61% IV. No oral contrast. COMPARISON:  06/10/2018 FINDINGS: Lower chest: Minimal bibasilar atelectasis Hepatobiliary: Gallbladder liver normal appearance Pancreas: Normal appearance Spleen: Normal appearance Adrenals/Urinary Tract: Adrenal glands, kidneys, ureters, and bladder normal appearance Stomach/Bowel: Normal appendix. Mild sigmoid diverticulosis without evidence of diverticulitis. Stomach and bowel loops otherwise normal appearance Vascular/Lymphatic: Atherosclerotic calcification aorta and iliac arteries without aneurysm. No adenopathy. Reproductive: Calcified leiomyomata within uterus. Unremarkable adnexa. Other: No free air or free fluid. Small umbilical hernia containing fat. Musculoskeletal: Unremarkable IMPRESSION: Mild sigmoid diverticulosis without evidence of diverticulitis. Calcified uterine leiomyomata. Small umbilical hernia containing fat. No acute intra-abdominal or intrapelvic abnormalities. Aortic Atherosclerosis (ICD10-I70.0). Electronically Signed   By: MLavonia DanaM.D.   On: 03/31/2021 15:00    DISCHARGE EXAMINATION: See progress note from earlier today   DISPOSITION: Home  Discharge  Instructions     Call MD for:  difficulty breathing, headache or visual disturbances   Complete by: As directed    Call MD for:  extreme fatigue   Complete by: As directed    Call MD for:  hives   Complete by: As directed    Call MD for:  persistant dizziness or light-headedness   Complete by: As directed    Call MD for:  persistant nausea and vomiting   Complete by: As directed    Call MD for:  severe uncontrolled pain   Complete by: As directed    Call MD for:  temperature >100.4   Complete by: As directed    Diet Carb Modified   Complete by: As directed    Discharge instructions   Complete by: As directed    Please follow up with your PCP in 1 week. Take prednisone as instructed till your PCP gives further instructions. She may need to refer you to a skin specialist. Seek attention if rash gets worse. Your PCP will need to follow up on the results of the blood tests done in the hospital the results of which are not back yet.  You were cared for by a hospitalist during your hospital stay. If you have any questions about your discharge medications or the care you received while you were in the hospital after you are discharged, you can call the unit and asked to speak with the hospitalist on  call if the hospitalist that took care of you is not available. Once you are discharged, your primary care physician will handle any further medical issues. Please note that NO REFILLS for any discharge medications will be authorized once you are discharged, as it is imperative that you return to your primary care physician (or establish a relationship with a primary care physician if you do not have one) for your aftercare needs so that they can reassess your need for medications and monitor your lab values. If you do not have a primary care physician, you can call [phone] for a physician referral.   Increase activity slowly   Complete by: As directed          Allergies as of 04/03/2021        Reactions   Bactrim [sulfamethoxazole-trimethoprim] Other (See Comments)   Appears to be causing DRESS   Bupropion Hives   Lexapro [escitalopram] Diarrhea, Nausea Only   Augmentin [amoxicillin-pot Clavulanate] Itching   Cyclobenzaprine Other (See Comments)   Excessive sleepiness   Pravastatin Other (See Comments)   Leg cramps/ mylagia   Atorvastatin Other (See Comments)   Leg cramps   Doxepin Hcl Other (See Comments)   Nightmares        Medication List     STOP taking these medications    loratadine 10 MG tablet Commonly known as: CLARITIN   sulfamethoxazole-trimethoprim 800-160 MG tablet Commonly known as: BACTRIM DS   triamcinolone 55 MCG/ACT Aero nasal inhaler Commonly known as: NASACORT       TAKE these medications    cetirizine 10 MG tablet Commonly known as: ZYRTEC Take 1 tablet twice daily as needed What changed:  how much to take how to take this when to take this reasons to take this additional instructions   Dexcom G6 Sensor Misc Inject 1 Device into the skin continuous for 10 days.   Dexcom G6 Transmitter Misc Use every 3 (three) months.   fluticasone 50 MCG/ACT nasal spray Commonly known as: FLONASE USE 2 SPRAYS IN EACH NOSTRIL ONCE A DAY What changed:  when to take this reasons to take this   FreeStyle Lite w/Device Kit USE TO CHECK BLOOD SUGAR 4 TIMES DAILY   HumaLOG KwikPen 200 UNIT/ML KwikPen Generic drug: insulin lispro INJECT UNDER SKIN 20-25 UNITS BEFORE EVERY MEAL, AND 10-15 UNITS BEFORE A SNACK (UP TO 100 UNITS A DAY).   ibuprofen 800 MG tablet Commonly known as: ADVIL Take 800 mg by mouth every 8 (eight) hours as needed for mild pain or moderate pain.   Insulin Pen Needle 32G X 4 MM Misc USE TO INJECT INSULIN   Unifine Pentips 32G X 4 MM Misc Generic drug: Insulin Pen Needle Use 5 times a day   levothyroxine 100 MCG tablet Commonly known as: SYNTHROID TAKE 1 TABLET BY MOUTH DAILY What changed: how much to take    metFORMIN 500 MG 24 hr tablet Commonly known as: Glucophage XR Take 2 tablets (1,000 mg total) by mouth 2 (two) times daily with a meal.   omeprazole 40 MG capsule Commonly known as: PRILOSEC Take 1 capsule (40 mg total) by mouth 2 (two) times daily as needed.   ondansetron 4 MG disintegrating tablet Commonly known as: ZOFRAN-ODT Dissolve 1 tablet by mouth 3 times daily for 3 days   OneTouch Delica Lancets 27N Misc Use to check blood sugar daily   freestyle lancets USE TO CHECK BLOOD SUGAR 4 TIMES DAILY   OneTouch Verio test strip Generic drug:  glucose blood Use as instructed to check blood sugar daily   FREESTYLE LITE test strip Generic drug: glucose blood USE TO CHECK BLOOD SUGAR 4 TIMES DAILY   Ozempic (0.25 or 0.5 MG/DOSE) 2 MG/1.5ML Sopn Generic drug: Semaglutide(0.25 or 0.5MG/DOS) Inject 0.5 mg into the skin once a week. What changed: additional instructions   predniSONE 10 MG tablet Commonly known as: DELTASONE Take 5 tablets once daily for 7 days followed by 4 tablets once daily till you see your PCP for further instructions.   rosuvastatin 5 MG tablet Commonly known as: CRESTOR Take 1 tablet (5 mg total) by mouth daily. What changed: Another medication with the same name was removed. Continue taking this medication, and follow the directions you see here.   Semglee (yfgn) 100 UNIT/ML Pen Generic drug: insulin glargine-yfgn Inject 40 units into the skin every morning and 40 units every evening at bedtime   sertraline 100 MG tablet Commonly known as: ZOLOFT Take 1 tablet (100 mg total) by mouth daily. What changed: Another medication with the same name was removed. Continue taking this medication, and follow the directions you see here.   Vitamin D (Ergocalciferol) 50 MCG (2000 UT) Caps Take 2,000 Units by mouth daily.          Follow-up Information     Harlan Stains, MD Follow up in 1 week(s).   Specialty: Family Medicine Contact  information: [place] [id number] [phone]                 TOTAL DISCHARGE TIME: 21 minutes  [place]  [facility] Pager on [email or web address]  04/04/2021, 12:18 PM

## 2021-04-03 NOTE — Progress Notes (Signed)
[facility] PROGRESS NOTE   Samantha [name] LAG:[id number] DOB: May 23, 1966 DOA: 04/02/2021  PCP: Harlan Stains, MD  Brief History/Interval Summary: 55 y.o. female with medical history significant of DM2, hypothyroidism. Pt has had intermittent headache for past 2 weeks. She reports that almost 2 weeks ago that she was having nausea, vomiting, diarrhea, and upper abdominal pain.  She was seen at urgent care and diagnosed with a UTI.  She was started on bactrim. Pt continued to feel poorly so presented to [facility] on 7/21. Found to be febrile at 102, UA neg, left without being seen after being in waiting room for 5h, no blood work drawn at that time. She followed up with her PCP who had an outpatient CT of her abdomen and pelvis performed, which was unremarkable.  However, she reports that she continues to have intermittent headaches, myalgias, and joint pain.  She has also noticed a red rash on her arms and legs.  She tells me the rash is new over the past couple of days.  Rash is not pruritic.  She is unsure if it has been spreading.  Found to have eosinophilia on blood work.  Concern was for DRESS.  ED providers discussed with dermatology in [facility] who recommended starting her on steroids.  Consultants: ED providers discussed with dermatology at [facility]  Procedures: None  Antibiotics: Anti-infectives (From admission, onward)    None       Subjective/Interval History: Patient mentions that she is feeling slightly better compared to yesterday but still very fatigued.  Denies any nausea or vomiting.  She did have a reddish rash over her arms and legs and abdomen.  Slightly better today compared to yesterday.     Assessment/Plan:  Concern for DRESS Likely triggered by recent Bactrim use for UTI.  Autoimmune work-up has been initiated at the time of admission including ANCA, ANA, RF, C3, C4, CRP, ESR, tryptase. Levels normal.  Lipase was  normal.  CRP 2.2.  ESR 24.  Other work-up is still pending.  HIV nonreactive.  Patient started on steroids after discussions with dermatology in [facility]. Rash appears to be slightly better.  Continue prednisone for now.  Diabetes mellitus type 2 Metformin on hold.  Lantus being continued.  Monitor CBGs.  HbA1c was 11.9 in May.  Hypokalemia This will be repleted.  Check magnesium.  Hypothyroidism Continue levothyroxine.  History of hyperlipidemia Continue Crestor.   DVT Prophylaxis: Lovenox Code Status: Full code Family Communication: Discussed with the patient Disposition Plan: Mobilize.  Hopefully discharge home when improved  Status is: Observation  The patient will require care spanning > 2 midnights and should be moved to inpatient because: Inpatient level of care appropriate due to severity of illness  Dispo: The patient is from: Home              Anticipated d/c is to: Home              Patient currently is not medically stable to d/c.   Difficult to place patient No       Medications: Scheduled:  enoxaparin (LOVENOX) injection  40 mg Subcutaneous Q24H   insulin aspart  0-20 Units Subcutaneous TID WC   insulin aspart  10 Units Subcutaneous TID WC   insulin glargine  40 Units Subcutaneous BID   levothyroxine  100 mcg Oral Daily   pantoprazole  80 mg Oral Daily   predniSONE  50 mg Oral Q breakfast  rosuvastatin  5 mg Oral Daily   sertraline  100 mg Oral Daily   Continuous: FXT:[id number] **OR** acetaminophen, ondansetron **OR** ondansetron (ZOFRAN) IV   Objective:  Vital Signs  Vitals:   04/03/21 0600 04/03/21 0700 04/03/21 0800 04/03/21 1000  BP: 99/67 97/65 138/75 125/81  Pulse: 71 71 95 91  Resp: 15  15 (!) 22  Temp:      TempSrc:      SpO2: 98% 98% 98% 100%   No intake or output data in the 24 hours ending 04/03/21 1017 There were no vitals filed for this visit.  General appearance: Awake alert.  In no distress Resp: Clear  to auscultation bilaterally.  Normal effort Cardio: S1-S2 is normal regular.  No S3-S4.  No rubs murmurs or bruit GI: Abdomen is soft.  Nontender nondistended.  Bowel sounds are present normal.  No masses organomegaly Extremities: No edema.  Full range of motion of lower extremities. Skin: Maculopapular rash identified in the lower extremities and some in the upper extremities. Neurologic: Alert and oriented x3.  No focal neurological deficits.    Lab Results:  Data Reviewed: I have personally reviewed following labs and imaging studies  CBC: Recent Labs  Lab 04/02/21 1713 04/03/21 0349  WBC 13.6* 12.8*  NEUTROABS 1.7 1.1*  HGB 12.4 11.5*  HCT 38.8 35.6*  MCV 83.1 81.1  PLT 237 426    Basic Metabolic Panel: Recent Labs  Lab 04/02/21 1713 04/03/21 0349  NA 138 137  K 3.5 3.2*  CL 103 104  CO2 24 22  GLUCOSE 130* 111*  BUN 13 15  CREATININE 1.32* 1.03*  CALCIUM 8.7* 8.1*    GFR: Estimated Creatinine Clearance: 66.6 mL/min (A) (by C-G formula based on SCr of 1.03 mg/dL (H)).  Liver Function Tests: Recent Labs  Lab 04/02/21 1713  AST 25  ALT 28  ALKPHOS 79  BILITOT 0.3  PROT 7.0  ALBUMIN 3.5    Recent Labs  Lab 04/03/21 0349  LIPASE 23     Cardiac Enzymes: Recent Labs  Lab 04/03/21 0005  CKTOTAL 120      CBG: Recent Labs  Lab 04/03/21 0805  GLUCAP 95     Recent Results (from the past 240 hour(s))  Resp Panel by RT-PCR (Flu A&B, Covid) Nasopharyngeal Swab     Status: None   Collection Time: 04/02/21  9:09 PM   Specimen: Nasopharyngeal Swab; Nasopharyngeal(NP) swabs in vial transport medium  Result Value Ref Range Status   SARS Coronavirus 2 by RT PCR NEGATIVE NEGATIVE Final    Comment: (NOTE) SARS-CoV-2 target nucleic acids are NOT DETECTED.  The SARS-CoV-2 RNA is generally detectable in upper respiratory specimens during the acute phase of infection. The lowest concentration of SARS-CoV-2 viral copies this assay can detect is 138  copies/mL. A negative result does not preclude SARS-Cov-2 infection and should not be used as the sole basis for treatment or other patient management decisions. A negative result may occur with  improper specimen collection/handling, submission of specimen other than nasopharyngeal swab, presence of viral mutation(s) within the areas targeted by this assay, and inadequate number of viral copies(<138 copies/mL). A negative result must be combined with clinical observations, patient history, and epidemiological information. The expected result is Negative.  Fact Sheet for Patients:  EntrepreneurPulse.com.au  Fact Sheet for Healthcare Providers:  IncredibleEmployment.be  This test is no t yet approved or cleared by the [place] FDA and  has been authorized for detection and/or diagnosis of SARS-CoV-2 by  FDA under an Emergency Use Authorization (EUA). This EUA will remain  in effect (meaning this test can be used) for the duration of the COVID-19 declaration under Section 564(b)(1) of the Act, 21 U.S.C.section 360bbb-3(b)(1), unless the authorization is terminated  or revoked sooner.       Influenza A by PCR NEGATIVE NEGATIVE Final   Influenza B by PCR NEGATIVE NEGATIVE Final    Comment: (NOTE) The Xpert Xpress SARS-CoV-2/FLU/RSV plus assay is intended as an aid in the diagnosis of influenza from Nasopharyngeal swab specimens and should not be used as a sole basis for treatment. Nasal washings and aspirates are unacceptable for Xpert Xpress SARS-CoV-2/FLU/RSV testing.  Fact Sheet for Patients: EntrepreneurPulse.com.au  Fact Sheet for Healthcare Providers: IncredibleEmployment.be  This test is not yet approved or cleared by the [place] FDA and has been authorized for detection and/or diagnosis of SARS-CoV-2 by FDA under an Emergency Use Authorization (EUA). This EUA will remain in effect (meaning  this test can be used) for the duration of the COVID-19 declaration under Section 564(b)(1) of the Act, 21 U.S.C. section 360bbb-3(b)(1), unless the authorization is terminated or revoked.  Performed at [facility], [place] [address], [place], [place] [address]       Radiology Studies: DG Chest 1 View  Result Date: 04/02/2021 CLINICAL DATA:  Headache, body aches, mild cough. Recent diagnosis of urinary tract infection. Nausea and flank pain. EXAM: CHEST  1 VIEW COMPARISON:  02/11/2020 FINDINGS: The heart size and mediastinal contours are within normal limits. Both lungs are clear. The visualized skeletal structures are unremarkable. IMPRESSION: No active disease. Electronically Signed   By: Lucienne Capers M.D.   On: 04/02/2021 17:57   CT Head Wo Contrast  Result Date: 04/03/2021 CLINICAL DATA:  Headache EXAM: CT HEAD WITHOUT CONTRAST TECHNIQUE: Contiguous axial images were obtained from the base of the skull through the vertex without intravenous contrast. COMPARISON:  None. FINDINGS: Brain: There is no mass, hemorrhage or extra-axial collection. The size and configuration of the ventricles and extra-axial CSF spaces are normal. The brain parenchyma is normal, without acute or chronic infarction. Vascular: No abnormal hyperdensity of the major intracranial arteries or dural venous sinuses. No intracranial atherosclerosis. Skull: The visualized skull base, calvarium and extracranial soft tissues are normal. Sinuses/Orbits: No fluid levels or advanced mucosal thickening of the visualized paranasal sinuses. No mastoid or middle ear effusion. The orbits are normal. IMPRESSION: Normal head CT. Electronically Signed   By: Ulyses Jarred M.D.   On: 04/03/2021 01:28       LOS: 0 days   [facility] Pager on [email or web address]  04/03/2021, 10:17 AM

## 2021-04-03 NOTE — H&P (Signed)
History and Physical    Samantha [name] XBL:[id number] DOB: [birth year] DOA: 04/02/2021  PCP: Harlan Stains, MD  Patient coming from: Home  I have personally briefly reviewed patient's old medical records in [place]  Chief Complaint: Body aches  HPI: Samantha [name] is a 55 y.o. female with medical history significant of DM2, hypothyroidism.  Pt has had intermittent headache for past 2 weeks.  She reports that almost 2 weeks ago that she was having nausea, vomiting, diarrhea, and upper abdominal pain.  She was seen at urgent care and diagnosed with a UTI.  She was started on bactrim.  Pt continued to feel poorly so presented to [facility] on 7/21.  Found to be febrile at 102, UA neg, left without being seen after being in waiting room for 5h, no blood work drawn at that time.  She followed up with her PCP who had an outpatient CT of her abdomen and pelvis performed, which was unremarkable.  However, she reports that she continues to have intermittent headaches, myalgias, and joint pain.  She has also noticed a red rash on her arms and legs.  She tells me the rash is new over the past couple of days.  Rash is not pruritic.  She is unsure if it has been spreading.  Denies CP, SOB, dysuria, hematuria, sore throat.   ED Course: Mild AKI with creat 1.3 up from 0.7 baseline today.  Most interestingly, pt has eosinophilia on peripheral blood smear with 8700 Eos.  CXR neg.  UA with just 30 protein but looks like this may be chronic.  COVID neg.   Review of Systems: As per HPI, otherwise all review of systems negative.  Past Medical History:  Diagnosis Date   Chest pain of uncertain etiology    Diabetes mellitus without complication ([place])    Type 2   Palpitations    Thyroid disease    Urticaria     Past Surgical History:  Procedure Laterality Date   CESAREAN SECTION     UTERINE FIBROID EMBOLIZATION       reports that she has never smoked. She has  never used smokeless tobacco. She reports that she does not drink alcohol and does not use drugs.  Allergies  Allergen Reactions   Bactrim [Sulfamethoxazole-Trimethoprim] Other (See Comments)    Appears to be causing DRESS   Bupropion Hives   Lexapro [Escitalopram] Diarrhea and Nausea Only   Augmentin [Amoxicillin-Pot Clavulanate] Itching   Cyclobenzaprine Other (See Comments)    Excessive sleepiness   Pravastatin Other (See Comments)    Leg cramps/ mylagia   Atorvastatin Other (See Comments)    Leg cramps   Doxepin Hcl Other (See Comments)    Nightmares     Family History  Problem Relation Age of Onset   Dementia Mother    Diabetes Mother    Stroke Father      Prior to Admission medications   Medication Sig Start Date End Date Taking? Authorizing Provider  Blood Glucose Monitoring Suppl (FREESTYLE LITE) w/Device KIT USE TO CHECK BLOOD SUGAR 4 TIMES DAILY 08/19/20 08/19/21  Philemon Kingdom, MD  cetirizine (ZYRTEC) 10 MG tablet Take 1 tablet twice daily as needed 08/01/18   Kennith Gain, MD  Continuous Blood Gluc Sensor (DEXCOM G6 SENSOR) MISC Inject 1 Device into the skin continuous for 10 days. 01/12/21 04/17/21  Philemon Kingdom, MD  Continuous Blood Gluc Transmit (DEXCOM G6 TRANSMITTER) MISC Use every 3 (three) months. 01/12/21   Gherghe,  Salena Saner, MD  fluticasone [facility]) 50 MCG/ACT nasal spray USE 2 SPRAYS IN [facility] NOSTRIL ONCE A DAY 10/25/20 10/25/21  Harlan Stains, MD  glucose blood (ONETOUCH VERIO) test strip Use as instructed to check blood sugar daily 03/11/20   Philemon Kingdom, MD  glucose blood test strip USE TO CHECK BLOOD SUGAR 4 TIMES DAILY Patient taking differently: USE TO CHECK BLOOD SUGAR 4 TIMES DAILY 08/19/20 08/19/21  Philemon Kingdom, MD  ibuprofen (ADVIL,MOTRIN) 800 MG tablet Take 800 mg by mouth every 8 (eight) hours as needed for mild pain or moderate pain.     [provider]  Insulin Glargine-yfgn (SEMGLEE, YFGN,) 100 UNIT/ML SOLN  Inject 40 Units into the skin in the morning and at bedtime. 01/13/21   Philemon Kingdom, MD  Insulin Glargine-yfgn (SEMGLEE, YFGN,) 100 UNIT/ML SOPN Inject 40 units into the skin every morning and 40 units every evening at bedtime 01/13/21   Philemon Kingdom, MD  insulin lispro (HUMALOG) 200 UNIT/ML KwikPen INJECT UNDER SKIN 20-25 UNITS BEFORE EVERY MEAL, AND 10-15 UNITS BEFORE A SNACK (UP TO 100 UNITS A DAY). 09/20/20 09/20/21  Philemon Kingdom, MD  Insulin Pen Needle (UNIFINE PENTIPS) 32G X 4 MM MISC Use 5 times a day 01/12/21   Philemon Kingdom, MD  Insulin Pen Needle 32G X 4 MM MISC USE TO INJECT INSULIN 12/28/20 12/28/21  Philemon Kingdom, MD  Lancets (FREESTYLE) lancets USE TO CHECK BLOOD SUGAR 4 TIMES DAILY Patient taking differently: USE TO CHECK BLOOD SUGAR 4 TIMES DAILY 08/19/20 08/19/21  Philemon Kingdom, MD  levothyroxine (SYNTHROID) 100 MCG tablet TAKE 1 TABLET BY MOUTH DAILY 02/14/21 02/14/22  Philemon Kingdom, MD  loratadine (CLARITIN) 10 MG tablet TAKE 1 TABLET BY MOUTH ONCE A DAY 10/25/20 10/25/21  Harlan Stains, MD  metFORMIN (GLUCOPHAGE XR) 500 MG 24 hr tablet Take 2 tablets (1,000 mg total) by mouth 2 (two) times daily with a meal. 01/12/21   Philemon Kingdom, MD  omeprazole (PRILOSEC) 40 MG capsule Take 40 mg by mouth daily.     [provider]  omeprazole (PRILOSEC) 40 MG capsule Take 1 capsule (40 mg total) by mouth 2 (two) times daily as needed. 12/15/20     ondansetron (ZOFRAN-ODT) 4 MG disintegrating tablet Dissolve 1 tablet by mouth 3 times daily for 3 days 4/50/38     OneTouch Delica Lancets 88K MISC Use to check blood sugar daily 03/15/20   Philemon Kingdom, MD  rosuvastatin (CRESTOR) 5 MG tablet Take 1 tablet (5 mg total) by mouth at bedtime. Patient taking differently: Take 5 mg by mouth daily. 10/26/16   Philemon Kingdom, MD  rosuvastatin (CRESTOR) 5 MG tablet Take 1 tablet (5 mg total) by mouth daily. 12/28/20     Semaglutide,0.25 or 0.5MG/DOS, (OZEMPIC, 0.25 OR 0.5  MG/DOSE,) 2 MG/1.5ML SOPN Inject 0.5 mg into the skin once a week. 01/12/21   Philemon Kingdom, MD  sertraline (ZOLOFT) 100 MG tablet Take 100 mg by mouth daily. 01/14/20   [provider]  sertraline (ZOLOFT) 100 MG tablet Take 1 tablet (100 mg total) by mouth daily. 12/28/20     triamcinolone (NASACORT) 55 MCG/ACT AERO nasal inhaler Place 2 sprays into each nostril once daily. 12/15/20     VITAMIN D, ERGOCALCIFEROL, PO Take 1 capsule by mouth daily.    [provider]    Physical Exam: Vitals:   04/03/21 0000 04/03/21 0030 04/03/21 0138 04/03/21 0400  BP: 109/71 116/82 103/73 (!) 95/58  Pulse: 88 88 80 71  Resp: 15  19 15  Temp:    98.5 F (36.9 C)  TempSrc:    Oral  SpO2: 99% 92% 96% 97%    Constitutional: NAD, calm, comfortable Eyes: PERRL, lids and conjunctivae normal ENMT: Mucous membranes are moist. Posterior pharynx clear of any exudate or lesions.Normal dentition.  Neck: normal, supple, no masses, no thyromegaly Respiratory: clear to auscultation bilaterally, no wheezing, no crackles. Normal respiratory effort. No accessory muscle use.  Cardiovascular: Regular rate and rhythm, no murmurs / rubs / gallops. No extremity edema. 2+ pedal pulses. No carotid bruits.  Abdomen: no tenderness, no masses palpated. No hepatosplenomegaly. Bowel sounds positive.  Musculoskeletal: no clubbing / cyanosis. No joint deformity upper and lower extremities. Good ROM, no contractures. Normal muscle tone.  Skin: Diffuse Maculopapular rash Neurologic: CN 2-12 grossly intact. Sensation intact, DTR normal. Strength 5/5 in all 4.  Psychiatric: Normal judgment and insight. Alert and oriented x 3. Normal mood.    Labs on Admission: I have personally reviewed following labs and imaging studies  CBC: Recent Labs  Lab 04/02/21 1713 04/03/21 0349  WBC 13.6* 12.8*  NEUTROABS 1.7  --   HGB 12.4 11.5*  HCT 38.8 35.6*  MCV 83.1 81.1  PLT 237 831   Basic Metabolic Panel: Recent Labs   Lab 04/02/21 1713 04/03/21 0349  NA 138 137  K 3.5 3.2*  CL 103 104  CO2 24 22  GLUCOSE 130* 111*  BUN 13 15  CREATININE 1.32* 1.03*  CALCIUM 8.7* 8.1*   GFR: Estimated Creatinine Clearance: 66.6 mL/min (A) (by C-G formula based on SCr of 1.03 mg/dL (H)). Liver Function Tests: Recent Labs  Lab 04/02/21 1713  AST 25  ALT 28  ALKPHOS 79  BILITOT 0.3  PROT 7.0  ALBUMIN 3.5   Recent Labs  Lab 04/03/21 0349  LIPASE 23   No results for input(s): AMMONIA in the last 168 hours. Coagulation Profile: No results for input(s): INR, PROTIME in the last 168 hours. Cardiac Enzymes: Recent Labs  Lab 04/03/21 0005  CKTOTAL 120   BNP (last 3 results) No results for input(s): PROBNP in the last 8760 hours. HbA1C: No results for input(s): HGBA1C in the last 72 hours. CBG: No results for input(s): GLUCAP in the last 168 hours. Lipid Profile: No results for input(s): CHOL, HDL, LDLCALC, TRIG, CHOLHDL, LDLDIRECT in the last 72 hours. Thyroid Function Tests: No results for input(s): TSH, T4TOTAL, FREET4, T3FREE, THYROIDAB in the last 72 hours. Anemia Panel: No results for input(s): VITAMINB12, FOLATE, FERRITIN, TIBC, IRON, RETICCTPCT in the last 72 hours. Urine analysis:    Component Value Date/Time   COLORURINE YELLOW 04/02/2021 1717   APPEARANCEUR HAZY (A) 04/02/2021 1717   LABSPEC 1.030 04/02/2021 1717   PHURINE 5.0 04/02/2021 1717   GLUCOSEU NEGATIVE 04/02/2021 1717   HGBUR NEGATIVE 04/02/2021 1717   BILIRUBINUR NEGATIVE 04/02/2021 1717   KETONESUR 5 (A) 04/02/2021 1717   PROTEINUR 30 (A) 04/02/2021 1717   NITRITE NEGATIVE 04/02/2021 1717   LEUKOCYTESUR TRACE (A) 04/02/2021 1717    Radiological Exams on Admission: DG Chest 1 View  Result Date: 04/02/2021 CLINICAL DATA:  Headache, body aches, mild cough. Recent diagnosis of urinary tract infection. Nausea and flank pain. EXAM: CHEST  1 VIEW COMPARISON:  02/11/2020 FINDINGS: The heart size and mediastinal contours  are within normal limits. Both lungs are clear. The visualized skeletal structures are unremarkable. IMPRESSION: No active disease. Electronically Signed   By: Lucienne Capers M.D.   On: 04/02/2021 17:57   CT  Head Wo Contrast  Result Date: 04/03/2021 CLINICAL DATA:  Headache EXAM: CT HEAD WITHOUT CONTRAST TECHNIQUE: Contiguous axial images were obtained from the base of the skull through the vertex without intravenous contrast. COMPARISON:  None. FINDINGS: Brain: There is no mass, hemorrhage or extra-axial collection. The size and configuration of the ventricles and extra-axial CSF spaces are normal. The brain parenchyma is normal, without acute or chronic infarction. Vascular: No abnormal hyperdensity of the major intracranial arteries or dural venous sinuses. No intracranial atherosclerosis. Skull: The visualized skull base, calvarium and extracranial soft tissues are normal. Sinuses/Orbits: No fluid levels or advanced mucosal thickening of the visualized paranasal sinuses. No mastoid or middle ear effusion. The orbits are normal. IMPRESSION: Normal head CT. Electronically Signed   By: Ulyses Jarred M.D.   On: 04/03/2021 01:28    EKG: Independently reviewed.  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[place]), unspecified Active Problems:   Uncontrolled type 2 diabetes mellitus with hyperglycemia, with long-term current use of insulin (HCC)   Hypereosinophilic syndrome    Hypereosinophilic syndrome - Concern primarily for early / mild DRESS in setting of recent bactrim use for UTI DDx includes EGPA (previously called churg-strauss) EDP spoke with derm at [facility]: Recd 0.5-1 mg/kg/day of prednisone given mild AKI Check trop and lipase Check ANCA, ANA, RF, C3, C4, CRP, ESR, tryptase Smear saved and pathologist smear review Adding on a Diff to this AMs labs to re-confirm eosinophilia. DM2 - Continue ozempic when med rec completed Hold metformin Continue Lantus 40u BID Hold  lispro 10u novolog TID AC Add SSI resistant scale May need additional mealtime coverage, pt takes up to 100 units of lispro a day! And this is before adding steroids into the mix Getting diabetes coordinator consult due to high insulin requirements, that will surely only be higher with steroids.  DVT prophylaxis: Lovenox Code Status: Full Family Communication: No family in room Disposition Plan: Home after clinical improvement Consults called: EDP spoke with Dermatology at [facility], unable to take transfers at this time. Admission status: Place in obs   Michall Noffke, [facility]  How to contact the [facility] Attending or Consulting provider [place] or covering provider during after hours [place], for this patient?  Check the care team in [facility] and look for a) attending/consulting [facility] provider listed and b) the [facility] team listed Log into [email or web address]  Amion Physician Scheduling and messaging for groups and whole hospitals  On call and physician scheduling software for group practices, residents, hospitalists and other medical providers for call, clinic, rotation and shift schedules. OnCall [employer] is a hospital-wide system for scheduling doctors and paging doctors on call. EasyPlot is for scientific plotting and data analysis.  [email or web address]  and use [place]'s universal password to access. If you do not have the password, please contact the hospital operator.  Locate the [facility] provider you are looking for under [facility] and page to a number that you can be directly reached. If you still have difficulty reaching the provider, please page the [facility] (Director on Call) for the Hospitalists listed on amion for assistance.  04/03/2021, 5:39 AM

## 2021-04-04 ENCOUNTER — Other Ambulatory Visit (HOSPITAL_COMMUNITY): Payer: Self-pay

## 2021-04-04 ENCOUNTER — Other Ambulatory Visit: Payer: Self-pay | Admitting: Internal Medicine

## 2021-04-04 LAB — HEMOGLOBIN A1C
Hgb A1c MFr Bld: 6.9 % — ABNORMAL HIGH (ref 4.8–5.6)
Mean Plasma Glucose: 151 mg/dL

## 2021-04-04 LAB — RHEUMATOID FACTOR: Rheumatoid fact SerPl-aCnc: <10 IU/mL (ref <14.0)

## 2021-04-04 LAB — ANCA TITERS
Atypical P-ANCA titer: <1:20 {titer}
C-ANCA: <1:20 {titer}
P-ANCA: <1:20 {titer}

## 2021-04-04 LAB — C3 COMPLEMENT: C3 Complement: 121 mg/dL (ref 82–167)

## 2021-04-04 LAB — C4 COMPLEMENT: Complement C4, Body Fluid: 52 mg/dL — ABNORMAL HIGH (ref 12–38)

## 2021-04-04 LAB — TRYPTASE: Tryptase: 3.6 ug/L (ref 2.2–13.2)

## 2021-04-04 LAB — ANA: Anti Nuclear Antibody (ANA): NEGATIVE

## 2021-04-04 MED ORDER — DAPAGLIFLOZIN PROPANEDIOL 10 MG PO TABS
ORAL_TABLET | Freq: Every day | ORAL | 1 refills | Status: DC
  Filled 2021-04-04: qty 90, 90d supply, fill #0
  Filled 2021-09-12: qty 90, 90d supply, fill #1

## 2021-04-05 DIAGNOSIS — D721 Eosinophilia, unspecified: Secondary | ICD-10-CM | POA: Diagnosis not present

## 2021-04-05 DIAGNOSIS — D7212 Drug rash with eosinophilia and systemic symptoms syndrome: Secondary | ICD-10-CM | POA: Diagnosis not present

## 2021-04-05 DIAGNOSIS — T50905A Adverse effect of unspecified drugs, medicaments and biological substances, initial encounter: Secondary | ICD-10-CM | POA: Diagnosis not present

## 2021-04-06 ENCOUNTER — Other Ambulatory Visit (HOSPITAL_COMMUNITY): Payer: Self-pay

## 2021-04-06 ENCOUNTER — Other Ambulatory Visit: Payer: Self-pay | Admitting: Internal Medicine

## 2021-04-07 ENCOUNTER — Other Ambulatory Visit (HOSPITAL_COMMUNITY): Payer: Self-pay

## 2021-04-08 ENCOUNTER — Other Ambulatory Visit (HOSPITAL_COMMUNITY): Payer: Self-pay

## 2021-04-10 ENCOUNTER — Other Ambulatory Visit (HOSPITAL_COMMUNITY): Payer: Self-pay

## 2021-04-11 ENCOUNTER — Other Ambulatory Visit (HOSPITAL_COMMUNITY): Payer: Self-pay

## 2021-04-20 ENCOUNTER — Other Ambulatory Visit: Payer: Self-pay | Admitting: Internal Medicine

## 2021-04-20 ENCOUNTER — Other Ambulatory Visit (HOSPITAL_COMMUNITY): Payer: Self-pay

## 2021-04-20 MED FILL — Insulin Lispro Soln Pen-injector 200 Unit/ML: SUBCUTANEOUS | 28 days supply | Qty: 12 | Fill #2 | Status: AC

## 2021-04-21 ENCOUNTER — Other Ambulatory Visit (HOSPITAL_COMMUNITY): Payer: Self-pay

## 2021-04-21 ENCOUNTER — Other Ambulatory Visit: Payer: Self-pay | Admitting: Internal Medicine

## 2021-04-22 ENCOUNTER — Other Ambulatory Visit (HOSPITAL_COMMUNITY): Payer: Self-pay

## 2021-04-24 ENCOUNTER — Other Ambulatory Visit (HOSPITAL_COMMUNITY): Payer: Self-pay

## 2021-04-24 ENCOUNTER — Other Ambulatory Visit: Payer: Self-pay | Admitting: Internal Medicine

## 2021-04-24 NOTE — Telephone Encounter (Signed)
Pt needs refill on   Insulin Glargine-yfgn (SEMGLEE, YFGN,) 100 UNIT/ML SOPN  [facility]

## 2021-04-25 ENCOUNTER — Other Ambulatory Visit (HOSPITAL_COMMUNITY): Payer: Self-pay

## 2021-04-25 MED FILL — Insulin Glargine-yfgn Soln Pen-Injector 100 Unit/ML: SUBCUTANEOUS | 30 days supply | Qty: 30 | Fill #0 | Status: AC

## 2021-05-01 DIAGNOSIS — D7212 Drug rash with eosinophilia and systemic symptoms syndrome: Secondary | ICD-10-CM | POA: Diagnosis not present

## 2021-05-01 DIAGNOSIS — R102 Pelvic and perineal pain: Secondary | ICD-10-CM | POA: Diagnosis not present

## 2021-05-01 DIAGNOSIS — M549 Dorsalgia, unspecified: Secondary | ICD-10-CM | POA: Diagnosis not present

## 2021-05-04 ENCOUNTER — Other Ambulatory Visit (HOSPITAL_COMMUNITY): Payer: Self-pay

## 2021-05-11 DIAGNOSIS — L270 Generalized skin eruption due to drugs and medicaments taken internally: Secondary | ICD-10-CM | POA: Diagnosis not present

## 2021-05-17 DIAGNOSIS — M25511 Pain in right shoulder: Secondary | ICD-10-CM | POA: Diagnosis not present

## 2021-05-17 DIAGNOSIS — M5412 Radiculopathy, cervical region: Secondary | ICD-10-CM | POA: Diagnosis not present

## 2021-05-18 ENCOUNTER — Other Ambulatory Visit: Payer: Self-pay

## 2021-05-18 ENCOUNTER — Encounter: Payer: Self-pay | Admitting: Internal Medicine

## 2021-05-18 ENCOUNTER — Ambulatory Visit (INDEPENDENT_AMBULATORY_CARE_PROVIDER_SITE_OTHER): Payer: 59 | Admitting: Internal Medicine

## 2021-05-18 VITALS — BP 128/82 | HR 92 | Ht 66.0 in | Wt 187.6 lb

## 2021-05-18 DIAGNOSIS — E890 Postprocedural hypothyroidism: Secondary | ICD-10-CM

## 2021-05-18 DIAGNOSIS — E1165 Type 2 diabetes mellitus with hyperglycemia: Secondary | ICD-10-CM | POA: Diagnosis not present

## 2021-05-18 DIAGNOSIS — E782 Mixed hyperlipidemia: Secondary | ICD-10-CM | POA: Diagnosis not present

## 2021-05-18 DIAGNOSIS — E11311 Type 2 diabetes mellitus with unspecified diabetic retinopathy with macular edema: Secondary | ICD-10-CM

## 2021-05-18 DIAGNOSIS — IMO0002 Reserved for concepts with insufficient information to code with codable children: Secondary | ICD-10-CM

## 2021-05-18 NOTE — Progress Notes (Signed)
Patient ID: Samantha [name], female   DOB: [birth year], 55 y.o.   MRN: [id number]  This visit occurred during the SARS-CoV-2 public health emergency.  Safety protocols were in place, including screening questions prior to the visit, additional usage of staff PPE, and extensive cleaning of exam room while observing appropriate contact time as indicated for disinfecting solutions.   HPI: Samantha [name] is a 55 y.o.-year-old female, returning for follow-up for DM2, dx 2012, insulin-dependent since 2015, uncontrolled, withcomplications (DR) and medication noncompliance.  Last visit 3 months ago.  Interim history: No increased urination, blurry vision, nausea, chest pain. She had a severe allergic reaction >> on Prednisone taper  - will finish in 1 week. She also had a steroid inj in shoulder yesterday. She has some weight loss since last visit.  Reviewed HbA1c levels: Lab Results  Component Value Date   HGBA1C 6.9 (H) 04/03/2021   HGBA1C 11.9 (A) 01/12/2021   HGBA1C 7.7 (A) 03/18/2020   HGBA1C 12.5 (A) 12/24/2019   HGBA1C 10.3 (A) 06/18/2019   HGBA1C 12.1 (A) 03/16/2019   HGBA1C 10.0 (A) 11/13/2018   HGBA1C 7.4 (A) 03/27/2018   HGBA1C 8.4 12/25/2017   HGBA1C 11.7 04/18/2017   HGBA1C 8.2 08/20/2016   HGBA1C 6.7 02/27/2016   HGBA1C 9.8 11/28/2015   HGBA1C 8.5 08/18/2015   HGBA1C 8.9 (H) 05/19/2015   HGBA1C 11.3 (H) 02/15/2015   HGBA1C 12.2 (H) 09/13/2014   05/27/2014: 12% 02/2014: 9%  She is on: - Metformin 1000 mg 2x a day with meals-restarted 01/2021 - Farxiga 10 mg before b'fast - Humalog (20-)25 units before every meal  - Basaglar >> Semglee 40 units 2x a day - Ozempic 0.5 mg weekly - started 01/2021 She was on Farxiga x 2 years in the past, but this was stopped b/c weight loss (205 >> 179 lbs), urinary frequency.  Tolerated well now. She tried regular metformin >> GI sxs.  She had a freestyle libre CGM in the past but this was not covered anymore at last visit.  She  now has a Dexcom CGM -checking sugars more than 4 times a day:    Prev: - am: 67, 96-145, 160 - 2h after b'fast: n/c - lunch: 88-159 - 2h after lunch: 167-177 - dinner: 54, 78-122 - 2h after dinner: n/c - bedtime: n/c  Previously:   Lowest sugar was 50s >> 119 >> 54 ; it is unclear at which level she has hypoglycemia unawareness. Highest sugar was 500 >> 300s >> 490 >> 177.  Pt's meals are: - Breakfast: cereal or bisquit - Lunch: hamburger, chinese - Dinner: chicken  - Snacks: 2-3: Frosted flakes! Fruit punch!  We discussed about the importance of stopping these. At last visit she was drinking sodas and shakes, now less.  -No CKD: Last BUN/creatinine: 12/28/2020: 23/0.95, glucose 143 Lab Results  Component Value Date   BUN 15 04/03/2021   BUN 13 04/02/2021   CREATININE 1.03 (H) 04/03/2021   CREATININE 1.32 (H) 04/02/2021  02/10/2015: 13/0.83, GFR 86 11/16/2013: 20/0.92, GFR 79  -+ HL; lipid panel: 12/28/2020: 186/207/46/104 01/29/2019: 226/265/48/125 Lab Results  Component Value Date   CHOL 130 12/25/2017   HDL 35.90 (L) 12/25/2017   LDLCALC 77 12/25/2017   TRIG 248 (H) 01/24/2020   CHOLHDL 4 12/25/2017  02/10/2015: 185/168/44/107  On Crestor.  - last eye exam was  11/16/2020: + DR - [name].  -No numbness and tingling in her feet.  Hypothyroidism after RAI treatment for Graves' disease -History  of medication noncompliance  Pt is on levothyroxine 100 mcg daily: - in am - fasting - at least 30 min from b'fast - no Ca, Fe, MVI, PPIs - not on Biotin  Review TFTs: Lab Results  Component Value Date   TSH 3.75 01/12/2021   TSH 0.38 (A) 01/14/2020   TSH 1.95 06/18/2019   TSH 1.028 06/10/2018   TSH 3.56 03/27/2018   FREET4 0.81 01/12/2021   FREET4 0.76 06/18/2019   FREET4 0.83 03/27/2018   FREET4 0.84 12/25/2017   FREET4 0.61 02/27/2016  07/28/2013: TSH 1.69  Pt denies: - feeling nodules in neck - hoarseness - dysphagia - choking - SOB  with lying down  ROS Constitutional: no weight gain/+ weight loss, no fatigue, no subjective hyperthermia, no subjective hypothermia Eyes: no  blurry vision, no xerophthalmia ENT: no sore throat, + see HPI Cardiovascular: no CP/no SOB/no palpitations/no leg swelling Respiratory: no cough/no SOB/no wheezing Gastrointestinal: no N/no V/no D/no C/no acid reflux Musculoskeletal: no muscle aches/no joint aches Skin: + Resolved rash, no hair loss Neurological: no tremors/no numbness/no tingling/no dizziness  I reviewed pt's medications, allergies, PMH, social hx, family hx, and changes were documented in the history of present illness. Otherwise, unchanged from my initial visit note.  Past Medical History:  Diagnosis Date   Chest pain of uncertain etiology    Diabetes mellitus without complication ([place])    Type 2   Palpitations    Thyroid disease    Urticaria    Past Surgical History:  Procedure Laterality Date   CESAREAN SECTION     UTERINE FIBROID EMBOLIZATION     Social History   Socioeconomic History   Marital status: Single    Spouse name: Not on file   Number of children: Not on file   Years of education: Not on file   Highest education level: Not on file  Occupational History   Not on file  Tobacco Use   Smoking status: Never   Smokeless tobacco: Never  Vaping Use   Vaping Use: Never used  Substance and Sexual Activity   Alcohol use: No    Alcohol/week: 0.0 standard drinks   Drug use: Never   Sexual activity: Not on file  Other Topics Concern   Not on file  Social History Narrative   Not on file   Social Determinants of Health   Financial Resource Strain: Not on file  Food Insecurity: Not on file  Transportation Needs: Not on file  Physical Activity: Not on file  Stress: Not on file  Social Connections: Not on file  Intimate Partner Violence: Not on file   Current Outpatient Medications on File Prior to Visit  Medication Sig Dispense Refill   Blood  Glucose Monitoring Suppl (FREESTYLE LITE) w/Device KIT USE TO CHECK BLOOD SUGAR 4 TIMES DAILY 1 kit 0   cetirizine (ZYRTEC) 10 MG tablet Take 1 tablet twice daily as needed 60 tablet 5   Continuous Blood Gluc Sensor (DEXCOM G6 SENSOR) MISC Inject 1 Device into the skin continuous for 10 days. 9 each 3   Continuous Blood Gluc Transmit (DEXCOM G6 TRANSMITTER) MISC Use every 3 (three) months. 1 each 3   dapagliflozin propanediol (FARXIGA) 10 MG TABS tablet TAKE 1 TABLET BY MOUTH DAILY 90 tablet 1   fluticasone (FLONASE) 50 MCG/ACT nasal spray USE 2 SPRAYS IN EACH NOSTRIL ONCE A DAY 48 g 3   glucose blood (ONETOUCH VERIO) test strip Use as instructed to check blood sugar daily 100 each 12  glucose blood test strip USE TO CHECK BLOOD SUGAR 4 TIMES DAILY 100 strip 12   ibuprofen (ADVIL,MOTRIN) 800 MG tablet Take 800 mg by mouth every 8 (eight) hours as needed for mild pain or moderate pain.      insulin lispro (HUMALOG) 200 UNIT/ML KwikPen INJECT UNDER SKIN 20-25 UNITS BEFORE EVERY MEAL, AND 10-15 UNITS BEFORE A SNACK (UP TO 100 UNITS A DAY). 18 mL 3   Insulin Pen Needle (UNIFINE PENTIPS) 32G X 4 MM MISC Use 5 times a day 400 each 3   Insulin Pen Needle 32G X 4 MM MISC USE TO INJECT INSULIN 100 each 0   Lancets (FREESTYLE) lancets USE TO CHECK BLOOD SUGAR 4 TIMES DAILY 100 each 12   levothyroxine (SYNTHROID) 100 MCG tablet TAKE 1 TABLET BY MOUTH DAILY 90 tablet 1   metFORMIN (GLUCOPHAGE XR) 500 MG 24 hr tablet Take 2 tablets (1,000 mg total) by mouth 2 (two) times daily with a meal. 360 tablet 3   omeprazole (PRILOSEC) 40 MG capsule Take 1 capsule (40 mg total) by mouth 2 (two) times daily as needed. 180 capsule 1   ondansetron (ZOFRAN-ODT) 4 MG disintegrating tablet Dissolve 1 tablet by mouth 3 times daily for 3 days 9 tablet 0   OneTouch Delica Lancets 44I MISC Use to check blood sugar daily 100 each 11   predniSONE (DELTASONE) 10 MG tablet Take 5 tablets once daily for 7 days followed by 4 tablets  once daily till you see your PCP for further instructions. 120 tablet 0   rosuvastatin (CRESTOR) 5 MG tablet Take 1 tablet (5 mg total) by mouth daily. 90 tablet 3   Semaglutide,0.25 or 0.5MG/DOS, (OZEMPIC, 0.25 OR 0.5 MG/DOSE,) 2 MG/1.5ML SOPN Inject 0.5 mg into the skin once a week. 4.5 mL 3   insulin glargine-yfgn (SEMGLEE, YFGN,) 100 UNIT/ML Pen Inject 40 units into the skin every morning and 40 units every evening at bedtime 30 mL 1   sertraline (ZOLOFT) 100 MG tablet Take 1 tablet (100 mg total) by mouth daily. 90 tablet 1   Vitamin D, Ergocalciferol, 50 MCG (2000 UT) CAPS Take 2,000 Units by mouth daily.     [DISCONTINUED] loratadine (CLARITIN) 10 MG tablet TAKE 1 TABLET BY MOUTH ONCE A DAY (Patient taking differently: Take 10 mg by mouth daily as needed for allergies.) 90 tablet 3   [DISCONTINUED] triamcinolone (NASACORT) 55 MCG/ACT AERO nasal inhaler Place 2 sprays into each nostril once daily. 1 each 12   No current facility-administered medications on file prior to visit.   Allergies  Allergen Reactions   Bactrim [Sulfamethoxazole-Trimethoprim] Other (See Comments)    Appears to be causing DRESS   Bupropion Hives   Lexapro [Escitalopram] Diarrhea and Nausea Only   Augmentin [Amoxicillin-Pot Clavulanate] Itching   Cyclobenzaprine Other (See Comments)    Excessive sleepiness   Pravastatin Other (See Comments)    Leg cramps/ mylagia   Atorvastatin Other (See Comments)    Leg cramps   Doxepin Hcl Other (See Comments)    Nightmares    Family History  Problem Relation Age of Onset   Dementia Mother    Diabetes Mother    Stroke Father     PE: BP 128/82 (BP Location: Right Arm, Patient Position: Sitting, Cuff Size: Normal)   Pulse 92   Ht _0  (1.676 m)   Wt 187 lb 9.6 oz (85.1 kg)   LMP 07/10/2012   SpO2 98%   BMI 30.28 kg/m  Body mass index  is 30.28 kg/m.  Wt Readings from Last 3 Encounters:  05/18/21 187 lb 9.6 oz (85.1 kg)  03/30/21 181 lb (82.1 kg)  02/16/21  189 lb 9.6 oz (86 kg)   Constitutional: overweight, in NAD Eyes: PERRLA, EOMI, no exophthalmos ENT: moist mucous membranes, no thyromegaly, no cervical lymphadenopathy Cardiovascular: tachycardia, RR, No MRG Respiratory: CTA B Gastrointestinal: abdomen soft, NT, ND, BS+ Musculoskeletal: no deformities, strength intact in all 4 Skin: moist, warm, no rashes Neurological: no tremor with outstretched hands, DTR normal in all 4  ASSESSMENT: 1. DM2, insulin-dependent, uncontrolled, with complications - DR  No family history of medullary thyroid cancer or personal history of pancreatitis.  2. Hypothyroidism - postablative for Graves ds.  3. HL  PLAN:  1.  Patient with history of uncontrolled type 2 diabetes, with spectacular improvement of diabetes control since 01/2021 after she cutdown sweet drinks, and an HbA1c decreased from 11.9% to 6.9% 1.5 months ago!  She also lost 15 pounds from 196 pounds in 01/2021! -She is on a regimen containing basal-bolus insulin regimen, oral medications (metformin, SGLT2 inhibitor), and weekly GLP-1 receptor agonist.  At last visit, sugars were much improved, to the point of lows (50s).  At that time, I advised her to use lower doses of Humalog with meals but otherwise continued the same regimen.  We did not increase Ozempic dose since she was having mild nausea with it. CGM interpretation: -At today's visit, we reviewed her CGM downloads: It appears that 68.8% of values are in target range (goal >70%), while 29.7% are higher than 180 (goal <25%), and 1.5% are lower than 70 (goal <4%).  The calculated average blood sugar is 155.  The projected HbA1c for the next 3 months ([facility]) is 7.0%. -Reviewing the CGM trends, it appears that the sugars are excellent during the night, they are higher occasionally after breakfast but they are much higher after lunch.  Upon questioning, she just started to take her Humalog pen with her whenever she eats lunch out, which is  frequently.  Even if she is injecting the insulin before lunch, sugars increased abruptly after this meal.  I encouraged her that for now, since she is still on prednisone, she may need a slightly higher dose before a larger meal, up to 28 units.  She will be finishing the prednisone taper next week and I expect the sugars to improve at that time.  We did discuss about potentially increasing the Ozempic dose but for now she would like to continue the same lower dose.  She also continues to drink sodas with lunch and I again advised her to stop these. - I suggested to:  Patient Instructions  Please continue: - Metformin 1000 mg 2x a day with meals - Farxiga 10 mg before b'fast - Humalog 20-25 units before every meal (may need 28 units before lunch or dinner) - Semglee 40 units 2x a day - Ozempic 0.5 mg weekly   TRY TO STOP SWEET DRINKS!!!!!  Please return in 3-4 months with your sugar log.   - advised to check sugars at different times of the day - 3-4x a day, rotating check times - advised for yearly eye exams >> she is UTD - return to clinic in 3-4 months  2. Hypothyroidism - latest thyroid labs reviewed with pt. >> normal: Lab Results  Component Value Date   TSH 3.75 01/12/2021  - she continues on LT4 100 mcg daily - pt feels good on this dose. - we discussed  about taking the thyroid hormone every day, with water, >30 minutes before breakfast, separated by >4 hours from acid reflux medications, calcium, iron, multivitamins. Pt. is taking it correctly.  3. HL  - Reviewed latest lipid panel from 12/2020: Triglycerides above target, LDL above goal of less than 70 - Continues Crestor 5 mg daily without side effects. -At last visit she was telling me that she reduced sodas to only once a day.  I strongly advised him to stop this completely.  We also discussed about the importance of cutting out processed foods  Philemon Kingdom, MD PhD [facility] Endocrinology

## 2021-05-18 NOTE — Patient Instructions (Addendum)
Please continue: - Metformin 1000 mg 2x a day with meals - Farxiga 10 mg before b'fast - Humalog 20-25 units before every meal (may need 28 units before lunch or dinner) - Semglee 40 units 2x a day - Ozempic 0.5 mg weekly   TRY TO STOP SWEET DRINKS!!!!!  Please return in 3-4 months with your sugar log.

## 2021-05-19 ENCOUNTER — Ambulatory Visit: Payer: 59 | Admitting: Internal Medicine

## 2021-05-22 ENCOUNTER — Other Ambulatory Visit (HOSPITAL_COMMUNITY): Payer: Self-pay

## 2021-05-23 ENCOUNTER — Other Ambulatory Visit (HOSPITAL_COMMUNITY): Payer: Self-pay

## 2021-05-26 ENCOUNTER — Other Ambulatory Visit (HOSPITAL_COMMUNITY): Payer: Self-pay

## 2021-05-29 ENCOUNTER — Other Ambulatory Visit (HOSPITAL_COMMUNITY): Payer: Self-pay

## 2021-05-30 ENCOUNTER — Encounter: Payer: Self-pay | Admitting: Internal Medicine

## 2021-05-31 ENCOUNTER — Other Ambulatory Visit (HOSPITAL_COMMUNITY): Payer: Self-pay

## 2021-06-05 MED FILL — Insulin Glargine-yfgn Soln Pen-Injector 100 Unit/ML: SUBCUTANEOUS | 30 days supply | Qty: 30 | Fill #1 | Status: AC

## 2021-06-06 ENCOUNTER — Other Ambulatory Visit (HOSPITAL_COMMUNITY): Payer: Self-pay

## 2021-06-23 ENCOUNTER — Other Ambulatory Visit (HOSPITAL_COMMUNITY): Payer: Self-pay

## 2021-06-23 DIAGNOSIS — R3989 Other symptoms and signs involving the genitourinary system: Secondary | ICD-10-CM | POA: Diagnosis not present

## 2021-06-23 DIAGNOSIS — N3000 Acute cystitis without hematuria: Secondary | ICD-10-CM | POA: Diagnosis not present

## 2021-06-23 DIAGNOSIS — J019 Acute sinusitis, unspecified: Secondary | ICD-10-CM | POA: Diagnosis not present

## 2021-06-23 MED ORDER — DOXYCYCLINE HYCLATE 100 MG PO TABS
ORAL_TABLET | ORAL | 0 refills | Status: DC
  Filled 2021-06-23: qty 14, 7d supply, fill #0

## 2021-06-24 ENCOUNTER — Other Ambulatory Visit (HOSPITAL_COMMUNITY): Payer: Self-pay

## 2021-06-29 ENCOUNTER — Other Ambulatory Visit (HOSPITAL_COMMUNITY): Payer: Self-pay

## 2021-06-29 DIAGNOSIS — F324 Major depressive disorder, single episode, in partial remission: Secondary | ICD-10-CM | POA: Diagnosis not present

## 2021-06-29 DIAGNOSIS — M62838 Other muscle spasm: Secondary | ICD-10-CM | POA: Diagnosis not present

## 2021-06-29 DIAGNOSIS — F419 Anxiety disorder, unspecified: Secondary | ICD-10-CM | POA: Diagnosis not present

## 2021-06-29 MED ORDER — SERTRALINE HCL 100 MG PO TABS
ORAL_TABLET | ORAL | 1 refills | Status: DC
  Filled 2021-06-29 – 2021-11-21 (×2): qty 90, 90d supply, fill #0

## 2021-07-04 MED FILL — Insulin Lispro Soln Pen-injector 200 Unit/ML: SUBCUTANEOUS | 28 days supply | Qty: 12 | Fill #3 | Status: AC

## 2021-07-05 ENCOUNTER — Other Ambulatory Visit (HOSPITAL_COMMUNITY): Payer: Self-pay

## 2021-07-10 ENCOUNTER — Other Ambulatory Visit (HOSPITAL_COMMUNITY): Payer: Self-pay

## 2021-07-19 ENCOUNTER — Other Ambulatory Visit (HOSPITAL_COMMUNITY): Payer: Self-pay

## 2021-07-19 DIAGNOSIS — G4719 Other hypersomnia: Secondary | ICD-10-CM | POA: Diagnosis not present

## 2021-07-19 DIAGNOSIS — M791 Myalgia, unspecified site: Secondary | ICD-10-CM | POA: Diagnosis not present

## 2021-07-19 DIAGNOSIS — R0683 Snoring: Secondary | ICD-10-CM | POA: Diagnosis not present

## 2021-07-19 MED ORDER — IBUPROFEN 800 MG PO TABS
800.0000 mg | ORAL_TABLET | Freq: Three times a day (TID) | ORAL | 0 refills | Status: DC | PRN
  Filled 2021-07-19: qty 60, 20d supply, fill #0

## 2021-07-24 ENCOUNTER — Other Ambulatory Visit (HOSPITAL_COMMUNITY): Payer: Self-pay

## 2021-08-01 ENCOUNTER — Encounter (HOSPITAL_BASED_OUTPATIENT_CLINIC_OR_DEPARTMENT_OTHER): Payer: Self-pay

## 2021-08-01 DIAGNOSIS — G4710 Hypersomnia, unspecified: Secondary | ICD-10-CM

## 2021-08-01 DIAGNOSIS — R0683 Snoring: Secondary | ICD-10-CM

## 2021-08-01 DIAGNOSIS — G4733 Obstructive sleep apnea (adult) (pediatric): Secondary | ICD-10-CM

## 2021-08-01 NOTE — Progress Notes (Unsigned)
ome 

## 2021-08-04 ENCOUNTER — Other Ambulatory Visit (HOSPITAL_COMMUNITY): Payer: Self-pay

## 2021-08-13 ENCOUNTER — Other Ambulatory Visit: Payer: Self-pay | Admitting: Internal Medicine

## 2021-08-14 ENCOUNTER — Other Ambulatory Visit (HOSPITAL_COMMUNITY): Payer: Self-pay

## 2021-08-14 MED ORDER — INSULIN GLARGINE-YFGN 100 UNIT/ML ~~LOC~~ SOPN
PEN_INJECTOR | SUBCUTANEOUS | 1 refills | Status: DC
Start: 2021-08-14 — End: 2021-11-21
  Filled 2021-08-14: qty 30, 37d supply, fill #0
  Filled 2021-09-12: qty 30, 37d supply, fill #1

## 2021-08-25 ENCOUNTER — Ambulatory Visit (HOSPITAL_BASED_OUTPATIENT_CLINIC_OR_DEPARTMENT_OTHER): Payer: 59 | Attending: Internal Medicine | Admitting: Internal Medicine

## 2021-08-25 DIAGNOSIS — R0683 Snoring: Secondary | ICD-10-CM

## 2021-08-25 DIAGNOSIS — G4733 Obstructive sleep apnea (adult) (pediatric): Secondary | ICD-10-CM | POA: Insufficient documentation

## 2021-08-25 DIAGNOSIS — G4710 Hypersomnia, unspecified: Secondary | ICD-10-CM | POA: Diagnosis not present

## 2021-08-29 ENCOUNTER — Telehealth: Payer: Self-pay

## 2021-08-29 NOTE — Telephone Encounter (Signed)
Inbound fax requesting forms be completed and faxed with recent clinical notes. Forms completed and faxed to [facility] at [phone].

## 2021-09-05 DIAGNOSIS — G4710 Hypersomnia, unspecified: Secondary | ICD-10-CM | POA: Diagnosis not present

## 2021-09-06 NOTE — Procedures (Signed)
° ° °  NAME: Samantha [name] DATE OF BIRTH:  07-01-1966 MEDICAL RECORD NUMBER [id number]  LOCATION: [facility]  PHYSICIAN: Marius Ditch  DATE OF STUDY: 08/25/2021  SLEEP STUDY TYPE: Out of Center Sleep Test                REFERRING PHYSICIAN: Harlan Stains, MD  EPWORTH SLEEPINESS SCORE:  14 HEIGHT:    WEIGHT:      There is no height or weight on file to calculate BMI.  NECK SIZE: 16 in.  CLINICAL INFORMATION The patient was referred to the sleep center for evaluation of witnessed apnea, snoring, awakening choking, and non-restorative sleep. She also has excessive daytime sleepiness.  MEDICATIONS Patient reports that no sleep medicine was administered.  SLEEP STUDY TECHNIQUE A multi-channel overnight portable sleep study was performed. The channels recorded were: nasal and oral airflow, thoracic and abdominal respiratory movement, and oxygen saturation with a pulse oximetry. Snoring and body position were also monitored.  TECHNICIAN COMMENTS Comments added by Technician: N/A Comments added by Scorer: N/A  RECORDING SUMMARY The study was initiated at 11:25:44 PM and terminated at 4:25:14 AM. The total recorded time was 299.5 minutes. Time in bed was 299.0 minutes.  RESPIRATORY PARAMETERS The overall AHI was 4.2 per hour, with a central apnea index of 0 per hour. The patient was supine for 56.3%. The arousal index was 0.0 per hour.The oxygen nadir was 92% during sleep.  CARDIAC DATA Mean heart rate during sleep was 79.2 bpm.  IMPRESSIONS - No Significant Obstructive Sleep Apnea (OSA) - No desaturations.  - Many flow limitations that may be Respiratory Effort Related Arousals are noted.   DIAGNOSIS - Normal study  RECOMMENDATIONS - Patient should consider consultation in sleep center to assess for other causes of daytime sleepiness (or to consider polysomnogram).   Marius Ditch Sleep specialist, [place] [employer]  ELECTRONICALLY SIGNED ON:  09/06/2021, 3:54 PM [place] PH: [phone]   FX: [phone] [place]

## 2021-09-12 ENCOUNTER — Other Ambulatory Visit (HOSPITAL_COMMUNITY): Payer: Self-pay

## 2021-09-15 ENCOUNTER — Other Ambulatory Visit (HOSPITAL_COMMUNITY): Payer: Self-pay

## 2021-09-15 ENCOUNTER — Ambulatory Visit: Payer: 59 | Admitting: Internal Medicine

## 2021-09-15 MED ORDER — HYDROCODONE-ACETAMINOPHEN 5-325 MG PO TABS
ORAL_TABLET | ORAL | 0 refills | Status: DC
  Filled 2021-09-15: qty 20, 5d supply, fill #0

## 2021-09-22 ENCOUNTER — Encounter (HOSPITAL_BASED_OUTPATIENT_CLINIC_OR_DEPARTMENT_OTHER): Payer: Self-pay

## 2021-09-22 DIAGNOSIS — G4710 Hypersomnia, unspecified: Secondary | ICD-10-CM

## 2021-09-22 DIAGNOSIS — R0683 Snoring: Secondary | ICD-10-CM

## 2021-10-04 ENCOUNTER — Other Ambulatory Visit: Payer: Self-pay | Admitting: Internal Medicine

## 2021-10-04 MED FILL — Fluticasone Propionate Nasal Susp 50 MCG/ACT: NASAL | 90 days supply | Qty: 48 | Fill #0 | Status: AC

## 2021-10-05 ENCOUNTER — Other Ambulatory Visit (HOSPITAL_COMMUNITY): Payer: Self-pay

## 2021-10-05 MED ORDER — HUMALOG KWIKPEN 200 UNIT/ML ~~LOC~~ SOPN
PEN_INJECTOR | SUBCUTANEOUS | 0 refills | Status: DC
  Filled 2021-10-05: qty 12, 28d supply, fill #0

## 2021-10-05 MED ORDER — METHOCARBAMOL 500 MG PO TABS
ORAL_TABLET | ORAL | 0 refills | Status: DC
  Filled 2021-10-05: qty 20, 20d supply, fill #0

## 2021-10-06 ENCOUNTER — Other Ambulatory Visit (HOSPITAL_COMMUNITY): Payer: Self-pay

## 2021-10-09 ENCOUNTER — Other Ambulatory Visit (HOSPITAL_COMMUNITY): Payer: Self-pay

## 2021-10-18 ENCOUNTER — Other Ambulatory Visit: Payer: Self-pay | Admitting: Internal Medicine

## 2021-10-18 ENCOUNTER — Other Ambulatory Visit (HOSPITAL_COMMUNITY): Payer: Self-pay

## 2021-10-18 MED ORDER — LEVOTHYROXINE SODIUM 100 MCG PO TABS
ORAL_TABLET | Freq: Every day | ORAL | 0 refills | Status: DC
  Filled 2021-10-18: qty 90, 90d supply, fill #0

## 2021-10-24 ENCOUNTER — Other Ambulatory Visit: Payer: Self-pay

## 2021-10-24 ENCOUNTER — Ambulatory Visit (HOSPITAL_BASED_OUTPATIENT_CLINIC_OR_DEPARTMENT_OTHER): Payer: No Typology Code available for payment source | Attending: Internal Medicine | Admitting: Sleep Medicine

## 2021-10-24 DIAGNOSIS — R0683 Snoring: Secondary | ICD-10-CM | POA: Diagnosis not present

## 2021-10-24 DIAGNOSIS — G4710 Hypersomnia, unspecified: Secondary | ICD-10-CM | POA: Insufficient documentation

## 2021-10-30 ENCOUNTER — Other Ambulatory Visit (HOSPITAL_COMMUNITY): Payer: Self-pay

## 2021-10-31 ENCOUNTER — Other Ambulatory Visit (HOSPITAL_COMMUNITY): Payer: Self-pay

## 2021-10-31 MED ORDER — AZITHROMYCIN 250 MG PO TABS
ORAL_TABLET | ORAL | 0 refills | Status: DC
  Filled 2021-10-31: qty 6, 5d supply, fill #0

## 2021-11-03 ENCOUNTER — Encounter (INDEPENDENT_AMBULATORY_CARE_PROVIDER_SITE_OTHER): Payer: Self-pay

## 2021-11-06 ENCOUNTER — Other Ambulatory Visit (HOSPITAL_COMMUNITY): Payer: Self-pay

## 2021-11-09 ENCOUNTER — Other Ambulatory Visit: Payer: Self-pay

## 2021-11-09 ENCOUNTER — Other Ambulatory Visit (HOSPITAL_COMMUNITY): Payer: Self-pay

## 2021-11-09 ENCOUNTER — Encounter: Payer: Self-pay | Admitting: Internal Medicine

## 2021-11-09 ENCOUNTER — Ambulatory Visit: Payer: No Typology Code available for payment source | Admitting: Internal Medicine

## 2021-11-09 VITALS — BP 120/72 | HR 89 | Ht 66.0 in | Wt 194.8 lb

## 2021-11-09 DIAGNOSIS — Z794 Long term (current) use of insulin: Secondary | ICD-10-CM

## 2021-11-09 DIAGNOSIS — E890 Postprocedural hypothyroidism: Secondary | ICD-10-CM

## 2021-11-09 DIAGNOSIS — E1165 Type 2 diabetes mellitus with hyperglycemia: Secondary | ICD-10-CM

## 2021-11-09 DIAGNOSIS — E11311 Type 2 diabetes mellitus with unspecified diabetic retinopathy with macular edema: Secondary | ICD-10-CM | POA: Diagnosis not present

## 2021-11-09 DIAGNOSIS — E782 Mixed hyperlipidemia: Secondary | ICD-10-CM | POA: Diagnosis not present

## 2021-11-09 LAB — POCT GLYCOSYLATED HEMOGLOBIN (HGB A1C): Hemoglobin A1C: 8.5 % — AB (ref 4.0–5.6)

## 2021-11-09 MED ORDER — OZEMPIC (1 MG/DOSE) 4 MG/3ML ~~LOC~~ SOPN
1.0000 mg | PEN_INJECTOR | SUBCUTANEOUS | 3 refills | Status: DC
  Filled 2021-11-09: qty 9, 84d supply, fill #0
  Filled 2022-02-13: qty 9, 84d supply, fill #1
  Filled 2022-05-20: qty 9, 84d supply, fill #2

## 2021-11-09 NOTE — Progress Notes (Signed)
Patient ID: Samantha [name], female   DOB: 10-28-1965, 56 y.o.   MRN: [id number]  This visit occurred during the SARS-CoV-2 public health emergency.  Safety protocols were in place, including screening questions prior to the visit, additional usage of staff PPE, and extensive cleaning of exam room while observing appropriate contact time as indicated for disinfecting solutions.   HPI: Samantha [name] is a 56 y.o.-year-old female, returning for follow-up for DM2, dx 2012, insulin-dependent since 2015, uncontrolled, withcomplications (DR) and medication noncompliance.  Last visit 6 months ago.  Interim history: She mentions increased urination, blurry vision, and also chest pain -off-and-on. She restarted to drink many sodas, also got steroid inj in R shoulder - last 3 mo ago.  Sugars have been much higher. She will start an exercise.   Reviewed HbA1c levels: Lab Results  Component Value Date   HGBA1C 6.9 (H) 04/03/2021   HGBA1C 11.9 (A) 01/12/2021   HGBA1C 7.7 (A) 03/18/2020   HGBA1C 12.5 (A) 12/24/2019   HGBA1C 10.3 (A) 06/18/2019   HGBA1C 12.1 (A) 03/16/2019   HGBA1C 10.0 (A) 11/13/2018   HGBA1C 7.4 (A) 03/27/2018   HGBA1C 8.4 12/25/2017   HGBA1C 11.7 04/18/2017   HGBA1C 8.2 08/20/2016   HGBA1C 6.7 02/27/2016   HGBA1C 9.8 11/28/2015   HGBA1C 8.5 08/18/2015   HGBA1C 8.9 (H) 05/19/2015   HGBA1C 11.3 (H) 02/15/2015   HGBA1C 12.2 (H) 09/13/2014  05/27/2014: 12% 02/2014: 9%  She is on: - Metformin 1000 mg 2x a day with meals-restarted 01/2021 - Farxiga 10 mg before b'fast - Humalog (20-)25 units before every meal  - ACTUALLY takes it AFTER the meal - Basaglar >> Semglee 40 units 2x a day - Ozempic 0.5 mg weekly - started 01/2021 She was on Farxiga x 2 years in the past, but this was stopped b/c weight loss (205 >> 179 lbs), urinary frequency.  Tolerated well now. She tried regular metformin >> GI sxs.  She had a freestyle libre CGM in the past but this was not covered  anymore at last visit.  She now has a Dexcom CGM -checking sugars more than 4 times a day:   Previously:   Lowest sugar was 50s >> 119 >> 54 >> 100; it is unclear at which level she has hypoglycemia unawareness. Highest sugar was 500 >> 300s >> 490 >> 177 >> 400.  Pt's meals are: - Breakfast: cereal or bisquit - Lunch: hamburger, chinese - Dinner: chicken  - Snacks: 2-3: Frosted flakes! Fruit punch!  We discussed about the importance of stopping these. At last visit she was drinking sodas and shakes, now less.  -No CKD: Last BUN/creatinine: 04/05/2021: 11/0.91, GFR 74, glucose 83 12/28/2020: 23/0.95, glucose 143 Lab Results  Component Value Date   BUN 15 04/03/2021   BUN 13 04/02/2021   CREATININE 1.03 (H) 04/03/2021   CREATININE 1.32 (H) 04/02/2021  02/10/2015: 13/0.83, GFR 86 11/16/2013: 20/0.92, GFR 79  -+ HL; lipid panel: 12/28/2020: 186/207/46/104 01/29/2019: 226/265/48/125 Lab Results  Component Value Date   CHOL 130 12/25/2017   HDL 35.90 (L) 12/25/2017   LDLCALC 77 12/25/2017   TRIG 248 (H) 01/24/2020   CHOLHDL 4 12/25/2017  02/10/2015: 185/168/44/107  On Crestor.  - last eye exam was  11/16/2020: + DR - [name].  -+ numbness and tingling in her feet recently.  Hypothyroidism after RAI treatment for Graves' disease -History of medication noncompliance  Pt is on levothyroxine 100 mcg daily: - in am - fasting - at  least 30 min from b'fast - no Ca, Fe, MVI, PPIs - not on Biotin  Review TFTs: Lab Results  Component Value Date   TSH 3.75 01/12/2021   TSH 0.38 (A) 01/14/2020   TSH 1.95 06/18/2019   TSH 1.028 06/10/2018   TSH 3.56 03/27/2018   FREET4 0.81 01/12/2021   FREET4 0.76 06/18/2019   FREET4 0.83 03/27/2018   FREET4 0.84 12/25/2017   FREET4 0.61 02/27/2016  07/28/2013: TSH 1.69  Pt denies: - feeling nodules in neck - hoarseness - dysphagia - choking - SOB with lying down  ROS + see HPI  I reviewed pt's medications, allergies,  PMH, social hx, family hx, and changes were documented in the history of present illness. Otherwise, unchanged from my initial visit note.  Past Medical History:  Diagnosis Date   Chest pain of uncertain etiology    Diabetes mellitus without complication ([place])    Type 2   Palpitations    Thyroid disease    Urticaria    Past Surgical History:  Procedure Laterality Date   CESAREAN SECTION     UTERINE FIBROID EMBOLIZATION     Social History   Socioeconomic History   Marital status: Single    Spouse name: Not on file   Number of children: Not on file   Years of education: Not on file   Highest education level: Not on file  Occupational History   Not on file  Tobacco Use   Smoking status: Never   Smokeless tobacco: Never  Vaping Use   Vaping Use: Never used  Substance and Sexual Activity   Alcohol use: No    Alcohol/week: 0.0 standard drinks   Drug use: Never   Sexual activity: Not on file  Other Topics Concern   Not on file  Social History Narrative   Not on file   Social Determinants of Health   Financial Resource Strain: Not on file  Food Insecurity: Not on file  Transportation Needs: Not on file  Physical Activity: Not on file  Stress: Not on file  Social Connections: Not on file  Intimate Partner Violence: Not on file   Current Outpatient Medications on File Prior to Visit  Medication Sig Dispense Refill   azithromycin (ZITHROMAX) 250 MG tablet Take 2 tablets by mouth on 1 day, then take 1 tablet daily for 4 days 6 tablet 0   cetirizine (ZYRTEC) 10 MG tablet Take 1 tablet twice daily as needed 60 tablet 5   Continuous Blood Gluc Sensor (DEXCOM G6 SENSOR) MISC Inject 1 device into the skin continuously for 10 days. 9 each 3   Continuous Blood Gluc Transmit (DEXCOM G6 TRANSMITTER) MISC Use every 3 (three) months. 1 each 3   dapagliflozin propanediol (FARXIGA) 10 MG TABS tablet TAKE 1 TABLET BY MOUTH DAILY 90 tablet 1   doxycycline (VIBRA-TABS) 100 MG tablet  Take 1 tablet by mouth twice a day for 7 days 14 tablet 0   fluticasone (FLONASE) 50 MCG/ACT nasal spray USE 2 SPRAYS IN EACH NOSTRIL ONCE A DAY 48 g 3   glucose blood (ONETOUCH VERIO) test strip Use as instructed to check blood sugar daily 100 each 12   HYDROcodone-acetaminophen (NORCO/VICODIN) 5-325 MG tablet Take 1 tablet by mouth every 6 hours as needed for 5 days. 20 tablet 0   ibuprofen (ADVIL) 800 MG tablet Take 1 tablet by mouth with food every 8 hours as needed for pain 60 tablet 0   ibuprofen (ADVIL,MOTRIN) 800 MG tablet Take 800  mg by mouth every 8 (eight) hours as needed for mild pain or moderate pain.      insulin glargine-yfgn (SEMGLEE, YFGN,) 100 UNIT/ML Pen Inject 40 units into the skin every morning and 40 units every evening at bedtime 30 mL 1   insulin lispro (HUMALOG KWIKPEN) 200 UNIT/ML KwikPen INJECT 20-25 UNITS UNDER THE SKIN BEFORE EVERY MEAL, AND 10-15 UNITS BEFORE A SNACK (UP TO 100 UNITS A DAY). 12 mL 0   Insulin Pen Needle (UNIFINE PENTIPS) 32G X 4 MM MISC Use 5 times a day 400 each 3   Insulin Pen Needle 32G X 4 MM MISC USE TO INJECT INSULIN 100 each 0   Lancets (FREESTYLE) lancets USE TO CHECK BLOOD SUGAR 4 TIMES DAILY 100 each 12   levothyroxine (SYNTHROID) 100 MCG tablet TAKE 1 TABLET BY MOUTH DAILY (must make appt for refills) 90 tablet 0   metFORMIN (GLUCOPHAGE XR) 500 MG 24 hr tablet Take 2 tablets (1,000 mg total) by mouth 2 (two) times daily with a meal. 360 tablet 3   methocarbamol (ROBAXIN) 500 MG tablet Take 1 tablet by mouth daily as needed 20 tablet 0   omeprazole (PRILOSEC) 40 MG capsule Take 1 capsule (40 mg total) by mouth 2 (two) times daily as needed. 180 capsule 1   ondansetron (ZOFRAN-ODT) 4 MG disintegrating tablet Dissolve 1 tablet by mouth 3 times daily for 3 days 9 tablet 0   OneTouch Delica Lancets 81X MISC Use to check blood sugar daily 100 each 11   predniSONE (DELTASONE) 10 MG tablet Take 5 tablets once daily for 7 days followed by 4 tablets  once daily till you see your PCP for further instructions. 120 tablet 0   rosuvastatin (CRESTOR) 5 MG tablet Take 1 tablet (5 mg total) by mouth daily. 90 tablet 3   Semaglutide,0.25 or 0.5MG /DOS, (OZEMPIC, 0.25 OR 0.5 MG/DOSE,) 2 MG/1.5ML SOPN Inject 0.5 mg into the skin once a week. 4.5 mL 3   sertraline (ZOLOFT) 100 MG tablet Take 1 tablet by mouth once daily 90 tablet 1   Vitamin D, Ergocalciferol, 50 MCG (2000 UT) CAPS Take 2,000 Units by mouth daily.     [DISCONTINUED] loratadine (CLARITIN) 10 MG tablet TAKE 1 TABLET BY MOUTH ONCE A DAY (Patient taking differently: Take 10 mg by mouth daily as needed for allergies.) 90 tablet 3   [DISCONTINUED] triamcinolone (NASACORT) 55 MCG/ACT AERO nasal inhaler Place 2 sprays into each nostril once daily. 1 each 12   No current facility-administered medications on file prior to visit.   Allergies  Allergen Reactions   Bactrim [Sulfamethoxazole-Trimethoprim] Other (See Comments)    Appears to be causing DRESS   Bupropion Hives   Lexapro [Escitalopram] Diarrhea and Nausea Only   Augmentin [Amoxicillin-Pot Clavulanate] Itching   Cyclobenzaprine Other (See Comments)    Excessive sleepiness   Pravastatin Other (See Comments)    Leg cramps/ mylagia   Atorvastatin Other (See Comments)    Leg cramps   Doxepin Hcl Other (See Comments)    Nightmares    Family History  Problem Relation Age of Onset   Dementia Mother    Diabetes Mother    Stroke Father     PE: BP 120/72 (BP Location: Right Arm, Patient Position: Sitting, Cuff Size: Normal)    Pulse 89    Ht 5\' 6"  (1.676 m)    Wt 194 lb 12.8 oz (88.4 kg)    LMP 07/10/2012    SpO2 99%    BMI 31.44  kg/m    Wt Readings from Last 3 Encounters:  11/09/21 194 lb 12.8 oz (88.4 kg)  10/24/21 190 lb (86.2 kg)  05/18/21 187 lb 9.6 oz (85.1 kg)   Constitutional: overweight, in NAD Eyes: PERRLA, EOMI, no exophthalmos ENT: moist mucous membranes, no thyromegaly, no cervical  lymphadenopathy Cardiovascular: tachycardia, RR, No MRG Respiratory: CTA B Musculoskeletal: no deformities, strength intact in all 4 Skin: moist, warm, no rashes Neurological: no tremor with outstretched hands, DTR normal in all 4 Diabetic Foot Exam - Simple   Simple Foot Form Diabetic Foot exam was performed with the following findings: Yes 11/09/2021 12:04 PM  Visual Inspection No deformities, no ulcerations, no other skin breakdown bilaterally: Yes Sensation Testing Intact to touch and monofilament testing bilaterally: Yes Pulse Check See comments: Yes Comments Dorsalis pedis pulses difficult to locate bilaterally    ASSESSMENT: 1. DM2, insulin-dependent, uncontrolled, with complications - DR  No family history of medullary thyroid cancer or personal history of pancreatitis.  2. Hypothyroidism - postablative for Graves ds.  3. HL  PLAN:  1.  Patient with history of uncontrolled type 2 diabetes, with spectacular improvement of her diabetes control after she cut down sweet drinks and an HbA1c decreased from 11.9% to 6.9% before last visit.  She also lost 15 pounds.  However, she was lost for follow-up after last visit, for the last 6 months.  At today's visit, sugars are much higher. CGM interpretation: -At today's visit, we reviewed her CGM downloads: It appears that 38% of values are in target range (goal >70%), while 62% are higher than 180 (goal <25%), and 0% are lower than 70 (goal <4%).  The calculated average blood sugar is 218.  The projected HbA1c for the next 3 months (GMI) is 8.5%. -Reviewing the CGM trends, sugars are much higher throughout the day, increasing in a stepwise fashion after every meal, with the most dramatic increase after dinner.  Sugars are then dropping abruptly overnight to a nadir in the 160s around 3 AM. -Upon questioning, she restarted to drink many sodas, and also had steroid injections since last visit.  Moreover, she is taking Humalog after meals  instead of 15 minutes before and my suspicion is that she is also missing some doses. -At today's visit, we discussed about the absolute need to stop sodas.  She agrees with this. I again advised her to take Humalog before every meal.  She also plans to start an exercise class. -We discussed about taking her diabetes care more seriously. She has hyperglycemic symptoms including increased urination, blurry vision, and she also had some chest pain.  She feels that this is coming and going.  I advised her that she will need to go to the emergency room and this happens again.  She also has neuropathy sxs. - I suggested to:  Patient Instructions  Please continue: - Metformin 1000 mg 2x a day with meals - Farxiga 10 mg before b'fast - Humalog 20-25 units before every meal  - Semglee 40 units 2x a day  Try to increase: - Ozempic 1 mg weekly   STOP SWEET DRINKS!!!!!  Please return in 4 months.   - we checked her HbA1c: 8.5% (higher) - advised to check sugars at different times of the day - 4x a day, rotating check times - advised for yearly eye exams >> she is UTD - return to clinic in 3 months  2. Hypothyroidism - latest thyroid labs reviewed with pt. >> normal: Lab Results  Component Value Date   TSH 3.75 01/12/2021  - she continues on LT4 100 mcg daily - pt feels good on this dose. - we discussed about taking the thyroid hormone every day, with water, >30 minutes before breakfast, separated by >4 hours from acid reflux medications, calcium, iron, multivitamins. Pt. is taking it correctly. - will check thyroid tests at next visit with me or with PCP (she has an annual physical exam coming up)  3. HL  -Reviewed latest lipid panel from 12/2020: Triglycerides above target, LDL also above our goal of less than 70 -Continues Crestor 5 mg daily without side effects -We again discussed at last visit about stopping sodas completely and cutting out processed foods  Philemon Kingdom, MD  PhD [facility] Endocrinology

## 2021-11-09 NOTE — Patient Instructions (Addendum)
Please continue: ?- Metformin 1000 mg 2x a day with meals ?- Farxiga 10 mg before b'fast ?- Humalog 20-25 units before every meal  ?- Semglee 40 units 2x a day ? ?Try to increase: ?- Ozempic 1 mg weekly  ? ?STOP SWEET DRINKS!!!!! ? ?Please return in 4 months.  ?

## 2021-11-13 NOTE — Procedures (Signed)
° °  NAME: Samantha [name] DATE OF BIRTH:  1966-03-15 MEDICAL RECORD NUMBER [id number]  LOCATION: [facility]  PHYSICIAN: Vinicius Brockman D Margi Edmundson  DATE OF STUDY: 10/24/2021  SLEEP STUDY TYPE: Nocturnal Polysomnogram               REFERRING PHYSICIAN: Harlan Stains, MD  CLINICAL INFORMATION [name] is a 56 year old Female and was referred to the sleep center for evaluation of OSA (G47.33). Indications include Daytime Fatigue, Depression, Diabetes, Insomnia, Morning Headaches, Parasomnias, Snoring, Witnesses Apnea/ Gasping During Sleep.   Most recent polysomnogram dated 08/25/2021 revealed an AHI of 4.2/h. MEDICATIONS Patient self administered medications include: Semglee Insulin, Humalog. No sleep medicine administered.  SLEEP STUDY TECHNIQUE A multi-channel overnight Polysomnography study was performed. The channels recorded and monitored were central and occipital EEG, electrooculogram (EOG), submentalis EMG (chin), nasal and oral airflow, thoracic and abdominal wall motion, anterior tibialis EMG, snore microphone, electrocardiogram, and a pulse oximetry. TECHNICAL COMMENTS Comments added by Technician: Patient had difficulty initiating sleep. Comments added by Scorer: N/A SLEEP ARCHITECTURE The study was initiated at 10:49:41 PM and terminated at 5:31:51 AM. The total recorded time was 402.2 minutes. EEG confirmed total sleep time was 248.5 minutes yielding a sleep efficiency of 61.8%. Sleep onset after lights out was 92.3 minutes with a REM latency of 99.5 minutes. The patient spent 9.9% of the night in stage N1 sleep, 80.1% in stage N2 sleep, 0.0% in stage N3 and 10.1% in REM. Wake after sleep onset (WASO) was 61.4 minutes. The Arousal Index was 11.1/hour. RESPIRATORY PARAMETERS There were a total of 3 respiratory disturbances out of which 0 were apneas (0 obstructive, 0 mixed, 0 central) and 3 hypopneas. The apnea/hypopnea index (AHI) was 0.7 events/hour. The  central sleep apnea index was 0 events/hour. The REM AHI was 0.0 events/hour and NREM AHI was 0.8 events/hour. The supine AHI was N/A events/hour and the non supine AHI was 0.7 supine during 0.00% of sleep. Respiratory disturbances index was 4.6 events/hour, associated with oxygen desaturation down to a nadir of 91.0% during sleep. The mean oxygen saturation during the study was 93.9%.  LEG MOVEMENT DATA The total leg movements were 0 with a resulting leg movement index of 0.0/hr. Associated arousal with leg movement index was 0.0/hr.  CARDIAC DATA The underlying cardiac rhythm was most consistent with sinus rhythm. Mean heart rate during sleep was 90.1 bpm. Additional rhythm abnormalities include None. IMPRESSIONS - No Significant Obstructive Sleep apnea (OSA) DIAGNOSIS - Primary Snoring (R06.83) RECOMMENDATIONS - Recommend a trial of Oral Appliance for snoring.  - Avoid alcohol, sedatives and other CNS depressants that may worsen sleep apnea and disrupt normal sleep architecture. - Sleep hygiene should be reviewed to assess factors that may improve sleep quality. - Weight management and regular exercise should be initiated or continued.  Elmarie Mainland, MD Diplomate, American Board of Sleep Medicine  ELECTRONICALLY SIGNED ON:  11/13/2021, 11:13 AM [place] PH: [phone]   FX: [phone] [place]

## 2021-11-14 ENCOUNTER — Other Ambulatory Visit (HOSPITAL_COMMUNITY): Payer: Self-pay

## 2021-11-14 ENCOUNTER — Other Ambulatory Visit: Payer: Self-pay | Admitting: Internal Medicine

## 2021-11-14 DIAGNOSIS — E1165 Type 2 diabetes mellitus with hyperglycemia: Secondary | ICD-10-CM

## 2021-11-14 MED ORDER — DEXCOM G7 SENSOR MISC
3.0000 | 1 refills | Status: DC
  Filled 2021-11-14: qty 3, 30d supply, fill #0

## 2021-11-15 ENCOUNTER — Other Ambulatory Visit (HOSPITAL_COMMUNITY): Payer: Self-pay

## 2021-11-15 MED ORDER — CLOBETASOL PROPIONATE 0.05 % EX SOLN
CUTANEOUS | 2 refills | Status: DC
  Filled 2021-11-15: qty 25, 30d supply, fill #0
  Filled 2021-12-21: qty 50, 30d supply, fill #1

## 2021-11-16 ENCOUNTER — Other Ambulatory Visit (HOSPITAL_COMMUNITY): Payer: Self-pay

## 2021-11-17 ENCOUNTER — Other Ambulatory Visit: Payer: Self-pay | Admitting: Family Medicine

## 2021-11-17 ENCOUNTER — Other Ambulatory Visit (HOSPITAL_COMMUNITY): Payer: Self-pay

## 2021-11-17 DIAGNOSIS — Z1231 Encounter for screening mammogram for malignant neoplasm of breast: Secondary | ICD-10-CM

## 2021-11-17 LAB — HM DIABETES EYE EXAM

## 2021-11-21 ENCOUNTER — Other Ambulatory Visit: Payer: Self-pay | Admitting: Internal Medicine

## 2021-11-22 ENCOUNTER — Other Ambulatory Visit (HOSPITAL_COMMUNITY): Payer: Self-pay

## 2021-11-22 MED ORDER — INSULIN GLARGINE-YFGN 100 UNIT/ML ~~LOC~~ SOPN
PEN_INJECTOR | SUBCUTANEOUS | 1 refills | Status: DC
  Filled 2021-11-22: qty 24, 30d supply, fill #0
  Filled 2022-01-23: qty 24, 30d supply, fill #1
  Filled 2022-03-31: qty 12, 15d supply, fill #2

## 2021-12-05 ENCOUNTER — Encounter: Payer: Self-pay | Admitting: Internal Medicine

## 2021-12-11 ENCOUNTER — Ambulatory Visit
Admission: RE | Admit: 2021-12-11 | Discharge: 2021-12-11 | Disposition: A | Discharge status: Home / Self Care | Payer: No Typology Code available for payment source | Source: Ambulatory Visit | Attending: Family Medicine | Admitting: Family Medicine

## 2021-12-11 DIAGNOSIS — Z1231 Encounter for screening mammogram for malignant neoplasm of breast: Secondary | ICD-10-CM

## 2021-12-18 ENCOUNTER — Other Ambulatory Visit (HOSPITAL_COMMUNITY): Payer: Self-pay

## 2021-12-18 ENCOUNTER — Other Ambulatory Visit: Payer: Self-pay | Admitting: Internal Medicine

## 2021-12-18 MED ORDER — DEXCOM G6 SENSOR MISC
1.0000 | 3 refills | Status: AC
  Filled 2021-12-18: qty 9, 90d supply, fill #0

## 2021-12-20 ENCOUNTER — Other Ambulatory Visit (HOSPITAL_COMMUNITY): Payer: Self-pay

## 2021-12-21 ENCOUNTER — Other Ambulatory Visit (HOSPITAL_COMMUNITY): Payer: Self-pay

## 2021-12-22 ENCOUNTER — Other Ambulatory Visit (HOSPITAL_COMMUNITY): Payer: Self-pay

## 2021-12-22 ENCOUNTER — Other Ambulatory Visit: Payer: Self-pay | Admitting: Internal Medicine

## 2021-12-22 MED ORDER — FREESTYLE LANCETS MISC
12 refills | Status: DC
  Filled 2021-12-22: qty 100, 25d supply, fill #0

## 2021-12-23 ENCOUNTER — Other Ambulatory Visit (HOSPITAL_COMMUNITY): Payer: Self-pay

## 2021-12-23 MED ORDER — FREESTYLE LITE TEST VI STRP
ORAL_STRIP | 0 refills | Status: DC
  Filled 2021-12-23: qty 100, 25d supply, fill #0

## 2022-01-05 ENCOUNTER — Other Ambulatory Visit (HOSPITAL_COMMUNITY): Payer: Self-pay

## 2022-01-05 MED ORDER — ROSUVASTATIN CALCIUM 5 MG PO TABS
5.0000 mg | ORAL_TABLET | Freq: Every day | ORAL | 3 refills | Status: DC
  Filled 2022-01-05 – 2022-06-20 (×2): qty 90, 90d supply, fill #0

## 2022-01-05 MED ORDER — SERTRALINE HCL 100 MG PO TABS
100.0000 mg | ORAL_TABLET | Freq: Every day | ORAL | 1 refills | Status: DC
  Filled 2022-06-20: qty 90, 90d supply, fill #0

## 2022-01-05 MED ORDER — TRIAMCINOLONE ACETONIDE 55 MCG/ACT NA AERO
INHALATION_SPRAY | NASAL | 12 refills | Status: DC
  Filled 2022-01-05: qty 1, 30d supply, fill #0

## 2022-01-08 ENCOUNTER — Other Ambulatory Visit (HOSPITAL_COMMUNITY): Payer: Self-pay

## 2022-01-09 ENCOUNTER — Other Ambulatory Visit (HOSPITAL_COMMUNITY): Payer: Self-pay

## 2022-01-09 MED ORDER — AMLODIPINE BESYLATE 5 MG PO TABS
ORAL_TABLET | ORAL | 5 refills | Status: DC
  Filled 2022-01-09: qty 30, 30d supply, fill #0

## 2022-01-17 ENCOUNTER — Other Ambulatory Visit (HOSPITAL_COMMUNITY): Payer: Self-pay

## 2022-01-24 ENCOUNTER — Encounter: Payer: Self-pay | Admitting: Pulmonary Disease

## 2022-01-24 ENCOUNTER — Other Ambulatory Visit (HOSPITAL_COMMUNITY): Payer: Self-pay

## 2022-01-24 ENCOUNTER — Ambulatory Visit (INDEPENDENT_AMBULATORY_CARE_PROVIDER_SITE_OTHER): Payer: No Typology Code available for payment source

## 2022-01-24 ENCOUNTER — Ambulatory Visit (INDEPENDENT_AMBULATORY_CARE_PROVIDER_SITE_OTHER): Payer: No Typology Code available for payment source | Admitting: Pulmonary Disease

## 2022-01-24 VITALS — BP 120/80 | HR 91 | Ht 66.0 in | Wt 188.0 lb

## 2022-01-24 DIAGNOSIS — R0609 Other forms of dyspnea: Secondary | ICD-10-CM

## 2022-01-24 MED ORDER — FLUTICASONE-SALMETEROL 115-21 MCG/ACT IN AERO
2.0000 | INHALATION_SPRAY | Freq: Two times a day (BID) | RESPIRATORY_TRACT | 6 refills | Status: DC
  Filled 2022-01-24: qty 12, 30d supply, fill #0

## 2022-01-24 NOTE — Progress Notes (Signed)
? ?      ?Samantha [name]    [id number]    [birth year] ? ?Primary Care Physician:White, Caren Griffins, MD ? ?Referring Physician: Harlan Stains, MD ?[address] ?[address] ?[place],  [place] [address] ? ?Chief complaint:   ?Patient being seen for shortness of breath ? ?HPI: ? ?She feels her breathing has not been right since having a pneumonia in 2021 ? ?We did look up a CT scan of the chest that showed multifocal infiltrate ?She did have an abdominal CT that was done about 9 months ago showing resolution of basal infiltrates ? ?She does have a cough, wheezing, chest pressure ? ?Exercise tolerance is decreased usually can tolerate an exercise bike for about 5 to 7 minutes ? ?Occasional cough, cough is nonproductive ? ?She does have nasal stuffiness and congestion ?History of anxiety/depression ? ?She works in healthcare ? ?Never smoker ? ?She does not recollect being on any medications that may have contributed to breathing problems ? ?No history of asthma ?But occasionally wheezes ? ?Outpatient Encounter Medications as of 01/24/2022  ?Medication Sig  ? amLODipine (NORVASC) 5 MG tablet Take 1 tablet by mouth once a day  ? cetirizine (ZYRTEC) 10 MG tablet Take 1 tablet twice daily as needed  ? clobetasol (TEMOVATE) 0.05 % external solution Apply a small amount to scalp twice a day  ? Continuous Blood Gluc Sensor (DEXCOM G7 SENSOR) MISC Use one sensor every 10 days  ? Continuous Blood Gluc Transmit (DEXCOM G6 TRANSMITTER) MISC Use every 3 (three) months.  ? dapagliflozin propanediol (FARXIGA) 10 MG TABS tablet TAKE 1 TABLET BY MOUTH DAILY  ? glucose blood (ONETOUCH VERIO) test strip Use as instructed to check blood sugar daily  ? ibuprofen (ADVIL) 800 MG tablet Take 1 tablet by mouth with food every 8 hours as needed for pain  ? insulin glargine-yfgn (SEMGLEE, YFGN,) 100 UNIT/ML Pen Inject 40 units into the skin every morning and 40 units every evening at bedtime  ? insulin lispro (HUMALOG KWIKPEN) 200 UNIT/ML  KwikPen INJECT 20-25 UNITS UNDER THE SKIN BEFORE EVERY MEAL, AND 10-15 UNITS BEFORE A SNACK (UP TO 100 UNITS A DAY).  ? Insulin Pen Needle (UNIFINE PENTIPS) 32G X 4 MM MISC Use 5 times a day  ? Lancets (FREESTYLE) lancets USE TO CHECK BLOOD SUGAR 4 TIMES DAILY  ? levothyroxine (SYNTHROID) 100 MCG tablet TAKE 1 TABLET BY MOUTH DAILY (must make appt for refills)  ? metFORMIN (GLUCOPHAGE XR) 500 MG 24 hr tablet Take 2 tablets (1,000 mg total) by mouth 2 (two) times daily with a meal.  ? methocarbamol (ROBAXIN) 500 MG tablet Take 1 tablet by mouth daily as needed  ? omeprazole (PRILOSEC) 40 MG capsule Take 1 capsule (40 mg total) by mouth 2 (two) times daily as needed.  ? ondansetron (ZOFRAN-ODT) 4 MG disintegrating tablet Dissolve 1 tablet by mouth 3 times daily for 3 days  ? OneTouch Delica Lancets 97D MISC Use to check blood sugar daily  ? predniSONE (DELTASONE) 10 MG tablet Take 5 tablets once daily for 7 days followed by 4 tablets once daily till you see your PCP for further instructions.  ? rosuvastatin (CRESTOR) 5 MG tablet Take 1 tablet by mouth once a day  ? Semaglutide, 1 MG/DOSE, (OZEMPIC, 1 MG/DOSE,) 4 MG/3ML SOPN Inject 1 mg into the skin once a week.  ? sertraline (ZOLOFT) 100 MG tablet Take 1 tablet by mouth once daily  ? sertraline (ZOLOFT) 100 MG tablet Take 1 tablet  by mouth Once a day  ? triamcinolone (NASACORT ALLERGY 24HR) 55 MCG/ACT AERO nasal inhaler Use 2 sparys in each nostril  Once a day  ? Vitamin D, Ergocalciferol, 50 MCG (2000 UT) CAPS Take 2,000 Units by mouth daily.  ? fluticasone (FLONASE) 50 MCG/ACT nasal spray USE 2 SPRAYS IN EACH NOSTRIL ONCE A DAY  ? [DISCONTINUED] azithromycin (ZITHROMAX) 250 MG tablet Take 2 tablets by mouth on 1 day, then take 1 tablet daily for 4 days (Patient not taking: Reported on 01/24/2022)  ? [DISCONTINUED] doxycycline (VIBRA-TABS) 100 MG tablet Take 1 tablet by mouth twice a day for 7 days (Patient not taking: Reported on 01/24/2022)  ? [DISCONTINUED]  HYDROcodone-acetaminophen (NORCO/VICODIN) 5-325 MG tablet Take 1 tablet by mouth every 6 hours as needed for 5 days. (Patient not taking: Reported on 01/24/2022)  ? [DISCONTINUED] ibuprofen (ADVIL,MOTRIN) 800 MG tablet Take 800 mg by mouth every 8 (eight) hours as needed for mild pain or moderate pain.  (Patient not taking: Reported on 01/24/2022)  ? [DISCONTINUED] loratadine (CLARITIN) 10 MG tablet TAKE 1 TABLET BY MOUTH ONCE A DAY (Patient taking differently: Take 10 mg by mouth daily as needed for allergies.)  ? ?No facility-administered encounter medications on file as of 01/24/2022.  ? ? ?Allergies as of 01/24/2022 - Review Complete 01/24/2022  ?Allergen Reaction Noted  ? Bactrim [sulfamethoxazole-trimethoprim] Other (See Comments) 04/03/2021  ? Bupropion Hives 06/24/2014  ? Augmentin [amoxicillin-pot clavulanate] Itching 07/31/2018  ? Cyclobenzaprine Other (See Comments) 07/31/2018  ? Lexapro [escitalopram] Diarrhea and Nausea Only 07/31/2018  ? Pravastatin Other (See Comments) 07/31/2018  ? Atorvastatin Other (See Comments) 06/24/2014  ? Doxepin hcl Other (See Comments) 06/24/2014  ? ? ?Past Medical History:  ?Diagnosis Date  ? Chest pain of uncertain etiology   ? Diabetes mellitus without complication ([place])   ? Type 2  ? Palpitations   ? Thyroid disease   ? Urticaria   ? ? ?Past Surgical History:  ?Procedure Laterality Date  ? CESAREAN SECTION    ? UTERINE FIBROID EMBOLIZATION    ? ? ?Family History  ?Problem Relation Age of Onset  ? Dementia Mother   ? Diabetes Mother   ? Stroke Father   ? ? ?Social History  ? ?Socioeconomic History  ? Marital status: Single  ?  Spouse name: Not on file  ? Number of children: Not on file  ? Years of education: Not on file  ? Highest education level: Not on file  ?Occupational History  ? Not on file  ?Tobacco Use  ? Smoking status: Never  ? Smokeless tobacco: Never  ?Vaping Use  ? Vaping Use: Never used  ?Substance and Sexual Activity  ? Alcohol use: No  ?  Alcohol/week: 0.0  standard drinks  ? Drug use: Never  ? Sexual activity: Not on file  ?Other Topics Concern  ? Not on file  ?Social History Narrative  ? Not on file  ? ?Social Determinants of Health  ? ?Financial Resource Strain: Not on file  ?Food Insecurity: Not on file  ?Transportation Needs: Not on file  ?Physical Activity: Not on file  ?Stress: Not on file  ?Social Connections: Not on file  ?Intimate Partner Violence: Not on file  ? ? ?Review of Systems  ?Constitutional:  Negative for fatigue.  ?Respiratory:  Positive for cough and shortness of breath.   ? ?Vitals:  ? 01/24/22 1433  ?BP: 120/80  ?Pulse: 91  ?SpO2: 98%  ? ? ? ?Physical Exam ?Constitutional:   ?  Appearance: She is obese.  ?HENT:  ?   Head: Normocephalic.  ?   Mouth/Throat:  ?   Mouth: Mucous membranes are moist.  ?Cardiovascular:  ?   Rate and Rhythm: Normal rate and regular rhythm.  ?   Heart sounds: No murmur heard. ?  No friction rub.  ?Pulmonary:  ?   Effort: No respiratory distress.  ?   Breath sounds: No stridor. No wheezing or rhonchi.  ?Musculoskeletal:  ?   Cervical back: No rigidity or tenderness.  ?Neurological:  ?   Mental Status: She is alert.  ?Psychiatric:     ?   Mood and Affect: Mood normal.  ? ? ? ?Data Reviewed: ?Past CT scan of the chest and abdomen reviewed ? ?Most recent sleep study reviewed showing AHI less than 5 ? ?Recent echocardiogram in 2021-normal left and right cardiac size and pressures ? ?Assessment:  ?Shortness of breath ? ?Persistent cough with wheezing ? ?Possible chronic bronchitis ? ?Possible airway hyperactivity ? ?Does not appear to have an acute infectious process contributing to symptoms at present ? ?Plan/Recommendations: ?Obtain pulmonary function test ? ?Obtain a chest x-ray today ? ?Empiric inhaler-Advair HFA ? ?Graded exercises as tolerated ? ?Encouraged to call with any significant concerns ? ? ? ? ?Sherrilyn Rist MD ?[facility] ?01/24/2022, 3:01 PM ? ?CC: Harlan Stains, MD ? ? ?

## 2022-01-24 NOTE — Patient Instructions (Addendum)
Schedule for breathing study ? ?Chest x-ray can be done today ? ?Prescription for Advair ? ?I will see you back in about 2 to 3 months ? ?Graded exercise as tolerated ? ?Call with significant concerns ? ?

## 2022-01-25 ENCOUNTER — Other Ambulatory Visit (HOSPITAL_COMMUNITY): Payer: Self-pay

## 2022-01-29 ENCOUNTER — Telehealth: Payer: Self-pay

## 2022-01-29 NOTE — Telephone Encounter (Signed)
Pt called and requested paperwork for Samantha [name] be completed. [name] supplies ordered via [employer] through online portal.

## 2022-01-30 ENCOUNTER — Other Ambulatory Visit (HOSPITAL_COMMUNITY): Payer: Self-pay

## 2022-01-30 ENCOUNTER — Other Ambulatory Visit: Payer: Self-pay | Admitting: Internal Medicine

## 2022-01-30 MED ORDER — FREESTYLE LITE TEST VI STRP
ORAL_STRIP | 0 refills | Status: AC
  Filled 2022-01-30: qty 100, 25d supply, fill #0

## 2022-02-08 ENCOUNTER — Telehealth: Payer: Self-pay

## 2022-02-09 ENCOUNTER — Other Ambulatory Visit (HOSPITAL_COMMUNITY): Payer: Self-pay

## 2022-02-09 ENCOUNTER — Ambulatory Visit: Payer: No Typology Code available for payment source | Admitting: Internal Medicine

## 2022-02-09 MED ORDER — HYDROCODONE-ACETAMINOPHEN 5-325 MG PO TABS
ORAL_TABLET | ORAL | 0 refills | Status: DC
  Filled 2022-02-09: qty 20, 5d supply, fill #0

## 2022-02-13 ENCOUNTER — Other Ambulatory Visit (HOSPITAL_COMMUNITY): Payer: Self-pay

## 2022-02-13 ENCOUNTER — Telehealth: Payer: Self-pay | Admitting: Pharmacy Technician

## 2022-02-13 ENCOUNTER — Other Ambulatory Visit: Payer: Self-pay | Admitting: Internal Medicine

## 2022-02-13 NOTE — Telephone Encounter (Signed)
Patient Advocate Encounter   Received notification from [employer] that prior authorization for Dexcom is required by his/her insurance [employer].  Key [id number] Archived. - per notes pt is getting though DME supplier, not through pharmacy benefit.

## 2022-02-14 ENCOUNTER — Other Ambulatory Visit (HOSPITAL_COMMUNITY): Payer: Self-pay

## 2022-02-14 MED ORDER — HUMALOG KWIKPEN 200 UNIT/ML ~~LOC~~ SOPN
PEN_INJECTOR | SUBCUTANEOUS | 0 refills | Status: DC
  Filled 2022-02-14: qty 12, 28d supply, fill #0

## 2022-02-19 ENCOUNTER — Encounter: Payer: Self-pay | Admitting: Gastroenterology

## 2022-02-20 ENCOUNTER — Other Ambulatory Visit: Payer: Self-pay | Admitting: Pulmonary Disease

## 2022-02-20 ENCOUNTER — Other Ambulatory Visit (HOSPITAL_COMMUNITY): Payer: Self-pay

## 2022-02-21 ENCOUNTER — Other Ambulatory Visit (HOSPITAL_COMMUNITY): Payer: Self-pay

## 2022-02-21 MED ORDER — FLUTICASONE-SALMETEROL 115-21 MCG/ACT IN AERO
2.0000 | INHALATION_SPRAY | Freq: Two times a day (BID) | RESPIRATORY_TRACT | 6 refills | Status: DC
  Filled 2022-02-21 – 2022-02-22 (×2): qty 12, 30d supply, fill #0

## 2022-02-22 ENCOUNTER — Telehealth: Payer: Self-pay | Admitting: Pulmonary Disease

## 2022-02-22 ENCOUNTER — Other Ambulatory Visit (HOSPITAL_COMMUNITY): Payer: Self-pay

## 2022-02-22 NOTE — Telephone Encounter (Signed)
I will be glad to put her on anything that is affordable for her

## 2022-02-22 NOTE — Telephone Encounter (Signed)
Called and spoke with patient. She stated that the brand name Advair costs $75. She was advised by the pharmacy that there is an generic option that costs $5. She prefers to have this.   [place]. The pharmacist tech ran a test claim on the generic. Her insurance does not cover the generic version at all.   Called and spoke with patient. She is aware of the above medication.   Dr. Ander Slade, do you have any other recommendations for an inhaler for her? We can send a message to the pharmacy team to check on the prices.

## 2022-02-23 ENCOUNTER — Other Ambulatory Visit (HOSPITAL_COMMUNITY): Payer: Self-pay

## 2022-02-23 MED ORDER — FLUTICASONE-SALMETEROL 250-50 MCG/ACT IN AEPB
1.0000 | INHALATION_SPRAY | Freq: Two times a day (BID) | RESPIRATORY_TRACT | 11 refills | Status: DC
Start: 2022-02-23 — End: 2023-02-13
  Filled 2022-02-23: qty 60, 30d supply, fill #0
  Filled 2022-06-20: qty 60, 30d supply, fill #1

## 2022-02-23 NOTE — Telephone Encounter (Signed)
Called the pharmacy back just to make sure that the RX that I sent in went through and it's only $5. They confirmed that it went through and it's only $5. I called and verified it with the patient and apologized for any confusion. She expressed understanding. Nothing further needed at this time.

## 2022-02-23 NOTE — Telephone Encounter (Signed)
Called and spoke with patient, advised her that the pharmacy ran a claim with the generic for Advair and they do not cover that medication at all.  I advised her to contact her insurance company, ask what is on their preferred list and then contact us back with that list so Dr. Ander Slade can prescribe something that is affordable.  She verbalized understanding.  I will leave this note open for when she calls back with the list.

## 2022-02-23 NOTE — Telephone Encounter (Signed)
Called and spoke with pharmacy to ask them which inhaler need to be sent in as the generic for Advair wixella or Advair Diskus. She advised it would be Advair Diskus. RX has been sent in. Called and told patient above information. She expressed understanding. Told her to call with any issues. Nothing further needed at this time.

## 2022-02-27 ENCOUNTER — Other Ambulatory Visit: Payer: Self-pay | Admitting: Internal Medicine

## 2022-02-28 ENCOUNTER — Other Ambulatory Visit (HOSPITAL_COMMUNITY): Payer: Self-pay

## 2022-02-28 MED ORDER — DAPAGLIFLOZIN PROPANEDIOL 10 MG PO TABS
ORAL_TABLET | Freq: Every day | ORAL | 1 refills | Status: DC
  Filled 2022-02-28: qty 90, 90d supply, fill #0
  Filled 2022-09-10 – 2022-09-18 (×2): qty 90, 90d supply, fill #1

## 2022-03-11 ENCOUNTER — Other Ambulatory Visit: Payer: Self-pay | Admitting: Internal Medicine

## 2022-03-12 ENCOUNTER — Other Ambulatory Visit (HOSPITAL_COMMUNITY): Payer: Self-pay

## 2022-03-12 MED ORDER — LEVOTHYROXINE SODIUM 100 MCG PO TABS
ORAL_TABLET | Freq: Every day | ORAL | 0 refills | Status: DC
  Filled 2022-03-12: qty 90, 90d supply, fill #0

## 2022-03-16 ENCOUNTER — Ambulatory Visit (AMBULATORY_SURGERY_CENTER): Payer: Self-pay | Admitting: *Deleted

## 2022-03-16 ENCOUNTER — Other Ambulatory Visit (HOSPITAL_COMMUNITY): Payer: Self-pay

## 2022-03-16 VITALS — Ht 66.0 in | Wt 188.0 lb

## 2022-03-16 DIAGNOSIS — Z1211 Encounter for screening for malignant neoplasm of colon: Secondary | ICD-10-CM

## 2022-03-16 MED ORDER — CLOBETASOL PROPIONATE 0.05 % EX SOLN
CUTANEOUS | 0 refills | Status: DC
  Filled 2022-03-16: qty 25, 30d supply, fill #0

## 2022-03-16 MED ORDER — ONDANSETRON HCL 4 MG PO TABS
4.0000 mg | ORAL_TABLET | ORAL | 0 refills | Status: DC
  Filled 2022-03-16: qty 2, 1d supply, fill #0

## 2022-03-16 MED ORDER — NA SULFATE-K SULFATE-MG SULF 17.5-3.13-1.6 GM/177ML PO SOLN
2.0000 | Freq: Once | ORAL | 0 refills | Status: AC
  Filled 2022-03-16: qty 354, 1d supply, fill #0

## 2022-03-16 NOTE — Progress Notes (Signed)
No egg or soy allergy known to patient  No issues known to pt with past sedation with any surgeries or procedures Patient denies ever being told they had issues or difficulty with intubation  No FH of Malignant Hyperthermia Pt is not on diet pills Pt is not on  home 02  Pt is not on blood thinners  Pt denies issues with constipation  No A fib or A flutter   Discussed with pt there will be an out-of-pocket cost for prep and that varies from $0 to 70 +  dollars - pt verbalized understanding    PV completed in person. Pt verified name, DOB.  Procedure explained to pt. Prep instructions reviewed, questions answered. Pt encouraged to call with questions or issues.  If pt has My chart, procedure instructions sent via My Chart

## 2022-03-19 ENCOUNTER — Other Ambulatory Visit (HOSPITAL_COMMUNITY): Payer: Self-pay

## 2022-03-22 ENCOUNTER — Other Ambulatory Visit (HOSPITAL_COMMUNITY): Payer: Self-pay

## 2022-04-02 ENCOUNTER — Other Ambulatory Visit (HOSPITAL_COMMUNITY): Payer: Self-pay

## 2022-04-04 ENCOUNTER — Other Ambulatory Visit (HOSPITAL_COMMUNITY): Payer: Self-pay

## 2022-04-05 ENCOUNTER — Other Ambulatory Visit (HOSPITAL_COMMUNITY): Payer: Self-pay

## 2022-04-05 MED ORDER — PREDNISONE 10 MG PO TABS
ORAL_TABLET | ORAL | 0 refills | Status: DC
  Filled 2022-04-05: qty 30, 12d supply, fill #0

## 2022-04-06 ENCOUNTER — Telehealth: Payer: Self-pay | Admitting: Gastroenterology

## 2022-04-06 NOTE — Telephone Encounter (Signed)
Inbound call from patient stating she stated a new medication called Prednisone 10 mg. Patient is scheduled for an upcoming procedure 04/13/22. Please give patient a call to further advise.  Thank you

## 2022-04-06 NOTE — Telephone Encounter (Signed)
Spoke with pt. Informed OK to continue prednisone without issues.

## 2022-04-10 ENCOUNTER — Encounter: Payer: Self-pay | Admitting: Gastroenterology

## 2022-04-13 ENCOUNTER — Encounter: Payer: Self-pay | Admitting: Gastroenterology

## 2022-04-13 ENCOUNTER — Ambulatory Visit (AMBULATORY_SURGERY_CENTER): Payer: No Typology Code available for payment source | Admitting: Gastroenterology

## 2022-04-13 VITALS — BP 130/80 | HR 78 | Temp 96.2°F | Resp 12 | Ht 66.0 in | Wt 188.0 lb

## 2022-04-13 DIAGNOSIS — Z1211 Encounter for screening for malignant neoplasm of colon: Secondary | ICD-10-CM | POA: Diagnosis present

## 2022-04-13 MED ORDER — SODIUM CHLORIDE 0.9 % IV SOLN: 500.0000 mL | INTRAVENOUS | Status: DC

## 2022-04-13 NOTE — Progress Notes (Signed)
Report given to PACU, vss 

## 2022-04-13 NOTE — Progress Notes (Signed)
Pt's states no medical or surgical changes since previsit or office visit. 

## 2022-04-13 NOTE — Op Note (Signed)
[facility] Patient Name: Samantha [name] Procedure Date: 04/13/2022 8:52 AM MRN: [id number] Endoscopist: Mallie Mussel L. Loletha Carrow , MD Age: 56 Referring MD:  Date of Birth: 01-27-1966 Gender: Female Account #: [id number] Procedure:                Colonoscopy Indications:              Screening for colorectal malignant neoplasm                           reports no polyps on diagnostic colonoscopy 10                            years ago Medicines:                Monitored Anesthesia Care Procedure:                Pre-Anesthesia Assessment:                           - Prior to the procedure, a History and Physical                            was performed, and patient medications and                            allergies were reviewed. The patient's tolerance of                            previous anesthesia was also reviewed. The risks                            and benefits of the procedure and the sedation                            options and risks were discussed with the patient.                            All questions were answered, and informed consent                            was obtained. Prior Anticoagulants: The patient has                            taken no previous anticoagulant or antiplatelet                            agents. ASA Grade Assessment: II - A patient with                            mild systemic disease. After reviewing the risks                            and benefits, the patient was deemed in  satisfactory condition to undergo the procedure.                           After obtaining informed consent, the colonoscope                            was passed under direct vision. Throughout the                            procedure, the patient's blood pressure, pulse, and                            oxygen saturations were monitored continuously. The                            [id number]-HQ190L Colonoscope was introduced through the                             anus and advanced to the the cecum, identified by                            appendiceal orifice and ileocecal valve. The                            colonoscopy was somewhat difficult due to multiple                            diverticula in the colon, a redundant colon and a                            tortuous colon. Successful completion of the                            procedure was aided by using manual pressure,                            straightening and shortening the scope to obtain                            bowel loop reduction and lavage. The patient                            tolerated the procedure well. The quality of the                            bowel preparation was good. The ileocecal valve,                            appendiceal orifice, and rectum were photographed. Scope In: 8:57:02 AM Scope Out: 9:13:51 AM Scope Withdrawal Time: 0 hours 9 minutes 1 second  Total Procedure Duration: 0 hours 16 minutes 49 seconds  Findings:                 The perianal and digital rectal examinations  were                            normal.                           Multiple diverticula were found in the left colon                            and right colon.                           Repeat examination of right colon under NBI                            performed.                           Retroflexion in the rectum was not performed due to                            anatomy.                           The exam was otherwise without abnormality. Complications:            No immediate complications. Estimated Blood Loss:     Estimated blood loss: none. Impression:               - Diverticulosis in the left colon and in the right                            colon.                           - The examination was otherwise normal.                           - No specimens collected. Recommendation:           - Patient has a contact number available for                             emergencies. The signs and symptoms of potential                            delayed complications were discussed with the                            patient. Return to normal activities tomorrow.                            Written discharge instructions were provided to the                            patient.                           - Resume  previous diet.                           - Continue present medications.                           - Repeat colonoscopy in 10 years for screening                            purposes. Raheen Capili L. Loletha Carrow, MD 04/13/2022 9:18:42 AM This report has been signed electronically.

## 2022-04-13 NOTE — Patient Instructions (Signed)
Resume previous diet and medications. Repeat Colonoscopy in 10 years for surveillance purposes.  YOU HAD AN ENDOSCOPIC PROCEDURE TODAY AT [facility]:   Refer to the procedure report that was given to you for any specific questions about what was found during the examination.  If the procedure report does not answer your questions, please call your gastroenterologist to clarify.  If you requested that your care partner not be given the details of your procedure findings, then the procedure report has been included in a sealed envelope for you to review at your convenience later.  YOU SHOULD EXPECT: Some feelings of bloating in the abdomen. Passage of more gas than usual.  Walking can help get rid of the air that was put into your GI tract during the procedure and reduce the bloating. If you had a lower endoscopy (such as a colonoscopy or flexible sigmoidoscopy) you may notice spotting of blood in your stool or on the toilet paper. If you underwent a bowel prep for your procedure, you may not have a normal bowel movement for a few days.  Please Note:  You might notice some irritation and congestion in your nose or some drainage.  This is from the oxygen used during your procedure.  There is no need for concern and it should clear up in a day or so.  SYMPTOMS TO REPORT IMMEDIATELY:  Following lower endoscopy (colonoscopy or flexible sigmoidoscopy):  Excessive amounts of blood in the stool  Significant tenderness or worsening of abdominal pains  Swelling of the abdomen that is new, acute  Fever of 100F or higher  For urgent or emergent issues, a gastroenterologist can be reached at any hour by calling [phone]. Do not use MyChart messaging for urgent concerns.    DIET:  We do recommend a small meal at first, but then you may proceed to your regular diet.  Drink plenty of fluids but you should avoid alcoholic beverages for 24 hours.  ACTIVITY:  You should plan to take it  easy for the rest of today and you should NOT DRIVE or use heavy machinery until tomorrow (because of the sedation medicines used during the test).    FOLLOW UP: Our staff will call the number listed on your records the next business day following your procedure.  We will call around 7:15- 8:00 am to check on you and address any questions or concerns that you may have regarding the information given to you following your procedure. If we do not reach you, we will leave a message.  If you develop any symptoms (ie: fever, flu-like symptoms, shortness of breath, cough etc.) before then, please call [phone].  If you test positive for Covid 19 in the 2 weeks post procedure, please call and report this information to [place].    If any biopsies were taken you will be contacted by phone or by letter within the next 1-3 weeks.  Please call us at [phone] if you have not heard about the biopsies in 3 weeks.    SIGNATURES/CONFIDENTIALITY: You and/or your care partner have signed paperwork which will be entered into your electronic medical record.  These signatures attest to the fact that that the information above on your After Visit Summary has been reviewed and is understood.  Full responsibility of the confidentiality of this discharge information lies with you and/or your care-partner.

## 2022-04-13 NOTE — Progress Notes (Signed)
History and Physical:  This patient presents for endoscopic testing for: Encounter Diagnosis  Name Primary?   Special screening for malignant neoplasms, colon Yes    Average risk - first colonoscopy Patient denies chronic abdominal pain, rectal bleeding, constipation or diarrhea.   Patient is otherwise without complaints or active issues today.   Past Medical History: Past Medical History:  Diagnosis Date   Anxiety    Chest pain of uncertain etiology    Diabetes mellitus without complication ([place])    Type 2   Hyperlipidemia    Palpitations    Thyroid disease    Urticaria      Past Surgical History: Past Surgical History:  Procedure Laterality Date   CESAREAN SECTION     UTERINE FIBROID EMBOLIZATION      Allergies: Allergies  Allergen Reactions   Bactrim [Sulfamethoxazole-Trimethoprim] Other (See Comments)    Appears to be causing DRESS   Bupropion Hives   Augmentin [Amoxicillin-Pot Clavulanate] Itching   Cyclobenzaprine Other (See Comments)    Excessive sleepiness   Lexapro [Escitalopram] Diarrhea and Nausea Only   Pravastatin Other (See Comments)    Leg cramps/ mylagia   Atorvastatin Other (See Comments)    Leg cramps   Doxepin Hcl Other (See Comments)    Nightmares     Outpatient Meds: Current Outpatient Medications  Medication Sig Dispense Refill   dapagliflozin propanediol (FARXIGA) 10 MG TABS tablet TAKE 1 TABLET BY MOUTH DAILY 90 tablet 1   insulin glargine-yfgn (SEMGLEE, YFGN,) 100 UNIT/ML Pen Inject 40 units into the skin every morning and 40 units every evening at bedtime 30 mL 1   insulin lispro (HUMALOG KWIKPEN) 200 UNIT/ML KwikPen INJECT 20-25 UNITS UNDER THE SKIN BEFORE EVERY MEAL, AND 10-15 UNITS BEFORE A SNACK (UP TO 100 UNITS A DAY). 12 mL 0   levothyroxine (SYNTHROID) 100 MCG tablet TAKE 1 TABLET BY MOUTH DAILY (must make appt for refills) 90 tablet 0   metFORMIN (GLUCOPHAGE XR) 500 MG 24 hr tablet Take 2 tablets (1,000 mg total) by mouth  2 (two) times daily with a meal. 360 tablet 3   methocarbamol (ROBAXIN) 500 MG tablet Take 1 tablet by mouth daily as needed 20 tablet 0   ondansetron (ZOFRAN) 4 MG tablet Take 1 tablet  by mouth as directed. Take 1 tablet 30 - 60 minutes prior to each colonoscopy prep dose 2 tablet 0   rosuvastatin (CRESTOR) 5 MG tablet Take 1 tablet by mouth once a day 90 tablet 3   Semaglutide, 1 MG/DOSE, (OZEMPIC, 1 MG/DOSE,) 4 MG/3ML SOPN Inject 1 mg into the skin once a week. 9 mL 3   sertraline (ZOLOFT) 100 MG tablet Take 1 tablet by mouth once daily 90 tablet 1   sertraline (ZOLOFT) 100 MG tablet Take 1 tablet by mouth Once a day 90 tablet 1   Vitamin D, Ergocalciferol, 50 MCG (2000 UT) CAPS Take 2,000 Units by mouth daily.     amLODipine (NORVASC) 5 MG tablet Take 1 tablet by mouth once a day 30 tablet 5   cetirizine (ZYRTEC) 10 MG tablet Take 1 tablet twice daily as needed 60 tablet 5   clobetasol (TEMOVATE) 0.05 % external solution Apply a small amount to scalp twice a day 25 mL 0   Continuous Blood Gluc Sensor (DEXCOM G7 SENSOR) MISC Use one sensor every 10 days 3 each 1   Continuous Blood Gluc Transmit (DEXCOM G6 TRANSMITTER) MISC Use every 3 (three) months. 1 each 3   fluticasone (FLONASE)  50 MCG/ACT nasal spray USE 2 SPRAYS IN EACH NOSTRIL ONCE A DAY 48 g 3   fluticasone-salmeterol (ADVAIR DISKUS) 250-50 MCG/ACT AEPB Inhale 1 puff into the lungs in the morning and at bedtime. 60 each 11   glucose blood (ONETOUCH VERIO) test strip Use as instructed to check blood sugar daily 100 each 12   HYDROcodone-acetaminophen (NORCO/VICODIN) 5-325 MG tablet Take 1 tablet every 6 hours by mouth as needed for 5 days. (Patient not taking: Reported on 03/16/2022) 20 tablet 0   ibuprofen (ADVIL) 800 MG tablet Take 1 tablet by mouth with food every 8 hours as needed for pain 60 tablet 0   Insulin Pen Needle (UNIFINE PENTIPS) 32G X 4 MM MISC Use 5 times a day 400 each 3   Lancets (FREESTYLE) lancets USE TO CHECK BLOOD  SUGAR 4 TIMES DAILY 100 each 12   omeprazole (PRILOSEC) 40 MG capsule Take 1 capsule (40 mg total) by mouth 2 (two) times daily as needed. 180 capsule 1   ondansetron (ZOFRAN-ODT) 4 MG disintegrating tablet Dissolve 1 tablet by mouth 3 times daily for 3 days (Patient not taking: Reported on 03/16/2022) 9 tablet 0   OneTouch Delica Lancets 70W MISC Use to check blood sugar daily 100 each 11   predniSONE (DELTASONE) 10 MG tablet Take 5 tablets once daily for 7 days followed by 4 tablets once daily till you see your PCP for further instructions. 120 tablet 0   predniSONE (DELTASONE) 10 MG tablet Take 4 tablets daily for 3 days --3 tablets daily for 3 days--2 tablets daily for 3 days--1 tablet daily for 3 days 30 tablet 0   triamcinolone (NASACORT ALLERGY 24HR) 55 MCG/ACT AERO nasal inhaler Use 2 sparys in each nostril  Once a day 1 each 12   Current Facility-Administered Medications  Medication Dose Route Frequency Provider Last Rate Last Admin   0.9 %  sodium chloride infusion  500 mL Intravenous Continuous Danis, Estill Cotta III, MD          ___________________________________________________________________ Objective   Exam:  BP 127/74   Pulse 85   Temp (!) 96.2 F (35.7 C)   Ht '5\' 6"'$  (1.676 m)   Wt 188 lb (85.3 kg)   LMP 07/10/2012   SpO2 96%   BMI 30.34 kg/m   CV: RRR without murmur, S1/S2 Resp: clear to auscultation bilaterally, normal RR and effort noted GI: soft, no tenderness, with active bowel sounds.   Assessment: Encounter Diagnosis  Name Primary?   Special screening for malignant neoplasms, colon Yes     Plan: Colonoscopy  The benefits and risks of the planned procedure were described in detail with the patient or (when appropriate) their health care proxy.  Risks were outlined as including, but not limited to, bleeding, infection, perforation, adverse medication reaction leading to cardiac or pulmonary decompensation, pancreatitis (if ERCP).  The limitation of  incomplete mucosal visualization was also discussed.  No guarantees or warranties were given.    The patient is appropriate for an endoscopic procedure in the ambulatory setting.   - Wilfrid Lund, MD

## 2022-04-16 ENCOUNTER — Telehealth: Payer: Self-pay

## 2022-04-16 NOTE — Telephone Encounter (Signed)
  Follow up Call-     04/13/2022    8:24 AM  Call back number  Post procedure Call Back phone  # [phone]  Permission to leave phone message Yes     Patient questions:  Do you have a fever, pain , or abdominal swelling? No. Pain Score  0 *  Have you tolerated food without any problems? Yes.    Have you been able to return to your normal activities? Yes.    Do you have any questions about your discharge instructions: Diet   No. Medications  No. Follow up visit  No.  Do you have questions or concerns about your Care? No.  Actions: * If pain score is 4 or above: No action needed, pain <4.

## 2022-04-23 ENCOUNTER — Other Ambulatory Visit (HOSPITAL_COMMUNITY): Payer: Self-pay

## 2022-04-23 MED ORDER — HYDROCODONE-ACETAMINOPHEN 5-325 MG PO TABS
ORAL_TABLET | ORAL | 0 refills | Status: DC
  Filled 2022-04-23: qty 15, 4d supply, fill #0

## 2022-04-24 ENCOUNTER — Other Ambulatory Visit (HOSPITAL_COMMUNITY): Payer: Self-pay

## 2022-04-25 ENCOUNTER — Other Ambulatory Visit: Payer: Self-pay | Admitting: Internal Medicine

## 2022-04-25 ENCOUNTER — Other Ambulatory Visit (HOSPITAL_COMMUNITY): Payer: Self-pay

## 2022-04-25 MED ORDER — INSULIN GLARGINE-YFGN 100 UNIT/ML ~~LOC~~ SOPN
PEN_INJECTOR | SUBCUTANEOUS | 0 refills | Status: DC
  Filled 2022-04-25: qty 30, 37d supply, fill #0

## 2022-04-29 ENCOUNTER — Other Ambulatory Visit (HOSPITAL_COMMUNITY): Payer: Self-pay

## 2022-05-01 ENCOUNTER — Other Ambulatory Visit (HOSPITAL_COMMUNITY): Payer: Self-pay

## 2022-05-01 MED ORDER — IBUPROFEN 800 MG PO TABS
ORAL_TABLET | ORAL | 0 refills | Status: DC
  Filled 2022-05-01: qty 60, 20d supply, fill #0

## 2022-05-03 ENCOUNTER — Ambulatory Visit (INDEPENDENT_AMBULATORY_CARE_PROVIDER_SITE_OTHER): Payer: No Typology Code available for payment source | Admitting: Pulmonary Disease

## 2022-05-03 ENCOUNTER — Encounter: Payer: Self-pay | Admitting: Pulmonary Disease

## 2022-05-03 VITALS — BP 128/82 | HR 90 | Temp 97.8°F | Ht 65.5 in | Wt 188.2 lb

## 2022-05-03 DIAGNOSIS — R0602 Shortness of breath: Secondary | ICD-10-CM | POA: Diagnosis not present

## 2022-05-03 DIAGNOSIS — R0609 Other forms of dyspnea: Secondary | ICD-10-CM | POA: Diagnosis not present

## 2022-05-03 LAB — PULMONARY FUNCTION TEST
DL/VA % pred: 108 %
DL/VA: 4.57 ml/min/mmHg/L
DLCO cor % pred: 76 %
DLCO cor: 16.55 ml/min/mmHg
DLCO unc % pred: 76 %
DLCO unc: 16.55 ml/min/mmHg
FEF 25-75 Post: 1.57 L/sec
FEF 25-75 Pre: 2.11 L/sec
FEF2575-%Change-Post: -25 %
FEF2575-%Pred-Post: 60 %
FEF2575-%Pred-Pre: 80 %
FEV1-%Change-Post: -4 %
FEV1-%Pred-Post: 62 %
FEV1-%Pred-Pre: 66 %
FEV1-Post: 1.76 L
FEV1-Pre: 1.85 L
FEV1FVC-%Change-Post: -1 %
FEV1FVC-%Pred-Pre: 105 %
FEV6-%Change-Post: -3 %
FEV6-%Pred-Post: 60 %
FEV6-%Pred-Pre: 63 %
FEV6-Post: 2.12 L
FEV6-Pre: 2.19 L
FEV6FVC-%Pred-Post: 103 %
FEV6FVC-%Pred-Pre: 103 %
FVC-%Change-Post: -3 %
FVC-%Pred-Post: 59 %
FVC-%Pred-Pre: 61 %
FVC-Post: 2.15 L
FVC-Pre: 2.22 L
Post FEV1/FVC ratio: 82 %
Post FEV6/FVC ratio: 100 %
Pre FEV1/FVC ratio: 83 %
Pre FEV6/FVC Ratio: 100 %
RV % pred: 75 %
RV: 1.5 L
TLC % pred: 76 %
TLC: 4.05 L

## 2022-05-03 NOTE — Progress Notes (Signed)
Full PFT Performed Today  

## 2022-05-03 NOTE — Patient Instructions (Signed)
Full PFT Performed Today  

## 2022-05-03 NOTE — Patient Instructions (Signed)
Continue Advair  Advair to be used twice a day regardless of how you are feeling If you get to a point where you feel you may not need inhalers, you can stop it for couple of weeks and see how your symptoms play out  Albuterol should be used as needed, up to 4 times a day if needed, sometimes you may go many days to weeks without needing it  I will see you in about 6 months Call with significant concerns

## 2022-05-03 NOTE — Progress Notes (Signed)
Samantha [name]    [id number]    [birth year]  Primary Care Physician:White, Caren Griffins, MD  Referring Physician: Harlan Stains, MD [place],  [place] [id number]  Chief complaint:   Patient being seen for shortness of breath  HPI:  Follow-up for shortness of breath Breathing has been about the same  Using Advair as needed  Breathing relatively stable Works from home at present so not as active   Denies a cough, no significant wheezing or chest pressure  She does have a cough, wheezing, chest pressure  Exercise tolerance is decreased usually can tolerate an exercise bike for about 5 to 7 minutes  Occasional cough, cough is nonproductive  She does have nasal stuffiness and congestion History of anxiety/depression  She works in healthcare  Never smoker  She does not recollect being on any medications that may have contributed to breathing problems  No history of asthma But occasionally wheezes  Outpatient Encounter Medications as of 05/03/2022  Medication Sig   cetirizine (ZYRTEC) 10 MG tablet Take 1 tablet twice daily as needed   Continuous Blood Gluc Sensor (DEXCOM G7 SENSOR) MISC Use one sensor every 10 days   Continuous Blood Gluc Transmit (DEXCOM G6 TRANSMITTER) MISC Use every 3 (three) months.   dapagliflozin propanediol (FARXIGA) 10 MG TABS tablet TAKE 1 TABLET BY MOUTH DAILY   fluticasone-salmeterol (ADVAIR DISKUS) 250-50 MCG/ACT AEPB Inhale 1 puff into the lungs in the morning and at bedtime.   glucose blood (ONETOUCH VERIO) test strip Use as instructed to check blood sugar daily   ibuprofen (ADVIL) 800 MG tablet Take 1 tablet by mouth every 8 hours as needed for pain with food   insulin glargine-yfgn (SEMGLEE, YFGN,) 100 UNIT/ML Pen Inject 40 units into the skin every morning and 40 units every evening at bedtime   insulin lispro (HUMALOG KWIKPEN) 200 UNIT/ML KwikPen INJECT 20-25 UNITS UNDER THE SKIN BEFORE EVERY MEAL,  AND 10-15 UNITS BEFORE A SNACK (UP TO 100 UNITS A DAY).   Insulin Pen Needle (UNIFINE PENTIPS) 32G X 4 MM MISC Use 5 times a day   Lancets (FREESTYLE) lancets USE TO CHECK BLOOD SUGAR 4 TIMES DAILY   levothyroxine (SYNTHROID) 100 MCG tablet TAKE 1 TABLET BY MOUTH DAILY (must make appt for refills)   metFORMIN (GLUCOPHAGE XR) 500 MG 24 hr tablet Take 2 tablets (1,000 mg total) by mouth 2 (two) times daily with a meal.   methocarbamol (ROBAXIN) 500 MG tablet Take 1 tablet by mouth daily as needed   omeprazole (PRILOSEC) 40 MG capsule Take 1 capsule (40 mg total) by mouth 2 (two) times daily as needed.   ondansetron (ZOFRAN) 4 MG tablet Take 1 tablet  by mouth as directed. Take 1 tablet 30 - 60 minutes prior to each colonoscopy prep dose   OneTouch Delica Lancets 02H MISC Use to check blood sugar daily   rosuvastatin (CRESTOR) 5 MG tablet Take 1 tablet by mouth once a day   Semaglutide, 1 MG/DOSE, (OZEMPIC, 1 MG/DOSE,) 4 MG/3ML SOPN Inject 1 mg into the skin once a week.   sertraline (ZOLOFT) 100 MG tablet Take 1 tablet by mouth Once a day   triamcinolone (NASACORT ALLERGY 24HR) 55 MCG/ACT AERO nasal inhaler Use 2 sparys in each nostril  Once a day   Vitamin D, Ergocalciferol, 50 MCG (2000 UT) CAPS Take 2,000 Units by mouth daily.   fluticasone (FLONASE) 50 MCG/ACT nasal spray USE 2 SPRAYS  IN EACH NOSTRIL ONCE A DAY   [DISCONTINUED] amLODipine (NORVASC) 5 MG tablet Take 1 tablet by mouth once a day   [DISCONTINUED] clobetasol (TEMOVATE) 0.05 % external solution Apply a small amount to scalp twice a day (Patient not taking: Reported on 05/03/2022)   [DISCONTINUED] HYDROcodone-acetaminophen (NORCO/VICODIN) 5-325 MG tablet Take 1 tablet by mouth every 6 hours as needed for pain for 4 days (Patient not taking: Reported on 05/03/2022)   [DISCONTINUED] ibuprofen (ADVIL) 800 MG tablet Take 1 tablet by mouth with food every 8 hours as needed for pain (Patient not taking: Reported on 05/03/2022)   [DISCONTINUED]  loratadine (CLARITIN) 10 MG tablet TAKE 1 TABLET BY MOUTH ONCE A DAY (Patient taking differently: Take 10 mg by mouth daily as needed for allergies.)   [DISCONTINUED] ondansetron (ZOFRAN-ODT) 4 MG disintegrating tablet Dissolve 1 tablet by mouth 3 times daily for 3 days (Patient not taking: Reported on 05/03/2022)   [DISCONTINUED] predniSONE (DELTASONE) 10 MG tablet Take 5 tablets once daily for 7 days followed by 4 tablets once daily till you see your PCP for further instructions. (Patient not taking: Reported on 05/03/2022)   [DISCONTINUED] predniSONE (DELTASONE) 10 MG tablet Take 4 tablets daily for 3 days --3 tablets daily for 3 days--2 tablets daily for 3 days--1 tablet daily for 3 days (Patient not taking: Reported on 05/03/2022)   [DISCONTINUED] sertraline (ZOLOFT) 100 MG tablet Take 1 tablet by mouth once daily (Patient not taking: Reported on 05/03/2022)   No facility-administered encounter medications on file as of 05/03/2022.    Allergies as of 05/03/2022 - Review Complete 05/03/2022  Allergen Reaction Noted   Bactrim [sulfamethoxazole-trimethoprim] Other (See Comments) 04/03/2021   Bupropion Hives 06/24/2014   Augmentin [amoxicillin-pot clavulanate] Itching 07/31/2018   Cyclobenzaprine Other (See Comments) 07/31/2018   Lexapro [escitalopram] Diarrhea and Nausea Only 07/31/2018   Pravastatin Other (See Comments) 07/31/2018   Atorvastatin Other (See Comments) 06/24/2014   Doxepin hcl Other (See Comments) 06/24/2014    Past Medical History:  Diagnosis Date   Anxiety    Chest pain of uncertain etiology    Diabetes mellitus without complication ([place])    Type 2   Hyperlipidemia    Palpitations    Thyroid disease    Urticaria     Past Surgical History:  Procedure Laterality Date   CESAREAN SECTION     UTERINE FIBROID EMBOLIZATION      Family History  Problem Relation Age of Onset   Dementia Mother    Diabetes Mother    Stroke Father    Colon polyps Neg Hx    Colon cancer  Neg Hx    Esophageal cancer Neg Hx    Stomach cancer Neg Hx    Rectal cancer Neg Hx     Social History   Socioeconomic History   Marital status: Single    Spouse name: Not on file   Number of children: Not on file   Years of education: Not on file   Highest education level: Not on file  Occupational History   Not on file  Tobacco Use   Smoking status: Never   Smokeless tobacco: Never  Vaping Use   Vaping Use: Never used  Substance and Sexual Activity   Alcohol use: No    Alcohol/week: 0.0 standard drinks of alcohol   Drug use: Never   Sexual activity: Not on file  Other Topics Concern   Not on file  Social History Narrative   Not on file   Social  Determinants of Health   Financial Resource Strain: Not on file  Food Insecurity: Not on file  Transportation Needs: Not on file  Physical Activity: Not on file  Stress: Not on file  Social Connections: Not on file  Intimate Partner Violence: Not on file    Review of Systems  Constitutional:  Negative for fatigue.  Respiratory:  Positive for cough and shortness of breath.     There were no vitals filed for this visit.    Physical Exam Constitutional:      Appearance: She is obese.  HENT:     Head: Normocephalic.     Mouth/Throat:     Mouth: Mucous membranes are moist.  Cardiovascular:     Rate and Rhythm: Normal rate and regular rhythm.     Heart sounds: No murmur heard.    No friction rub.  Pulmonary:     Effort: No respiratory distress.     Breath sounds: No stridor. No wheezing or rhonchi.  Musculoskeletal:     Cervical back: No rigidity or tenderness.  Neurological:     Mental Status: She is alert.  Psychiatric:        Mood and Affect: Mood normal.     Data Reviewed: Past CT scan of the chest and abdomen reviewed  Most recent sleep study reviewed showing AHI less than 5  Recent echocardiogram in 2021-normal left and right cardiac size and pressures  PFT today reviewed with the patient  showing no obstruction, no significant bronchodilator response, mild restriction with normal diffusing capacity  Assessment:  Shortness of breath  Possible chronic bronchitis  Possible airway hyperactivity  Does not appear to have an acute infectious process contributing to symptoms at present  Plan/Recommendations: Continue Advair HFA  Continue albuterol use as needed  Graded exercise as tolerated  Follow-up in 6 months   Sherrilyn Rist MD [facility] 05/03/2022, 2:11 PM  CC: Harlan Stains, MD

## 2022-05-21 ENCOUNTER — Other Ambulatory Visit (HOSPITAL_COMMUNITY): Payer: Self-pay

## 2022-05-21 MED ORDER — CLOBETASOL PROPIONATE 0.05 % EX SOLN
CUTANEOUS | 3 refills | Status: DC
  Filled 2022-05-21: qty 25, 30d supply, fill #0
  Filled 2022-07-09: qty 50, 30d supply, fill #1
  Filled 2022-08-26: qty 25, 30d supply, fill #2

## 2022-05-22 ENCOUNTER — Other Ambulatory Visit (HOSPITAL_COMMUNITY): Payer: Self-pay

## 2022-05-23 ENCOUNTER — Other Ambulatory Visit (HOSPITAL_COMMUNITY): Payer: Self-pay

## 2022-05-28 ENCOUNTER — Other Ambulatory Visit (HOSPITAL_COMMUNITY): Payer: Self-pay

## 2022-05-30 ENCOUNTER — Other Ambulatory Visit (HOSPITAL_COMMUNITY): Payer: Self-pay

## 2022-05-30 MED ORDER — HYDROCODONE-ACETAMINOPHEN 5-325 MG PO TABS
1.0000 | ORAL_TABLET | Freq: Four times a day (QID) | ORAL | 0 refills | Status: DC | PRN
  Filled 2022-05-30: qty 20, 5d supply, fill #0

## 2022-06-08 ENCOUNTER — Ambulatory Visit (INDEPENDENT_AMBULATORY_CARE_PROVIDER_SITE_OTHER): Payer: No Typology Code available for payment source | Admitting: Internal Medicine

## 2022-06-08 ENCOUNTER — Other Ambulatory Visit: Payer: Self-pay | Admitting: Internal Medicine

## 2022-06-08 ENCOUNTER — Other Ambulatory Visit (HOSPITAL_COMMUNITY): Payer: Self-pay

## 2022-06-08 ENCOUNTER — Encounter: Payer: Self-pay | Admitting: Internal Medicine

## 2022-06-08 VITALS — BP 138/82 | HR 79 | Ht 65.5 in | Wt 186.8 lb

## 2022-06-08 DIAGNOSIS — E11311 Type 2 diabetes mellitus with unspecified diabetic retinopathy with macular edema: Secondary | ICD-10-CM | POA: Diagnosis not present

## 2022-06-08 DIAGNOSIS — E1165 Type 2 diabetes mellitus with hyperglycemia: Secondary | ICD-10-CM

## 2022-06-08 DIAGNOSIS — E890 Postprocedural hypothyroidism: Secondary | ICD-10-CM

## 2022-06-08 DIAGNOSIS — E782 Mixed hyperlipidemia: Secondary | ICD-10-CM

## 2022-06-08 LAB — POCT GLYCOSYLATED HEMOGLOBIN (HGB A1C): Hemoglobin A1C: 7.7 % — AB (ref 4.0–5.6)

## 2022-06-08 LAB — T4, FREE: Free T4: 0.55 ng/dL — ABNORMAL LOW (ref 0.60–1.60)

## 2022-06-08 LAB — TSH: TSH: 31.9 u[IU]/mL — ABNORMAL HIGH (ref 0.35–5.50)

## 2022-06-08 MED ORDER — METFORMIN HCL ER 500 MG PO TB24
1000.0000 mg | ORAL_TABLET | Freq: Every day | ORAL | 3 refills | Status: DC
Start: 2022-06-08 — End: 2023-02-13

## 2022-06-08 MED ORDER — INSULIN GLARGINE-YFGN 100 UNIT/ML ~~LOC~~ SOPN: 40.0000 [IU] | PEN_INJECTOR | Freq: Every day | SUBCUTANEOUS | 1 refills | Status: DC

## 2022-06-08 MED ORDER — OZEMPIC (0.25 OR 0.5 MG/DOSE) 2 MG/3ML ~~LOC~~ SOPN: PEN_INJECTOR | SUBCUTANEOUS | 1 refills | Status: DC

## 2022-06-08 MED ORDER — LEVOTHYROXINE SODIUM 100 MCG PO TABS
100.0000 ug | ORAL_TABLET | Freq: Every day | ORAL | 0 refills | Status: DC
  Filled 2022-06-08: qty 90, 90d supply, fill #0

## 2022-06-08 NOTE — Patient Instructions (Addendum)
Please change: - Metformin 1000 mg with dinner - Farxiga 10 mg before b'fast - Humalog 12-20 units 15 min before every meal (only takes this if you eat) - Semglee 40 units 1x a day - Ozempic 0.5 mg weekly (36 clicks on the 1 mg pen)  STOP SWEET DRINKS!  Please return in 4 months.

## 2022-06-08 NOTE — Progress Notes (Addendum)
Patient ID: Samantha [name], female   DOB: Jan 27, 1966, 56 y.o.   MRN: [id number]  HPI: Samantha [name] is a 56 y.o.-year-old female, returning for follow-up for DM2, dx 2012, insulin-dependent since 2015, uncontrolled, withcomplications (DR) and medication noncompliance.  Last visit 7 months ago.  Interim history: She denies increased urination, blurry vision. She has decreased appetite. Before last visit, she restarted to drink many sodas, also got steroid inj in R shoulder -  Sugars were much higher. No more steroid inj's since last OV. She is now preparing for surgery and needs clearance today. She has been off all of her diabetes medicines for 2 weeks due to the lack of appetite !  Reviewed HbA1c levels: Lab Results  Component Value Date   HGBA1C 8.5 (A) 11/09/2021   HGBA1C 6.9 (H) 04/03/2021   HGBA1C 11.9 (A) 01/12/2021   HGBA1C 7.7 (A) 03/18/2020   HGBA1C 12.5 (A) 12/24/2019   HGBA1C 10.3 (A) 06/18/2019   HGBA1C 12.1 (A) 03/16/2019   HGBA1C 10.0 (A) 11/13/2018   HGBA1C 7.4 (A) 03/27/2018   HGBA1C 8.4 12/25/2017   HGBA1C 11.7 04/18/2017   HGBA1C 8.2 08/20/2016   HGBA1C 6.7 02/27/2016   HGBA1C 9.8 11/28/2015   HGBA1C 8.5 08/18/2015   HGBA1C 8.9 (H) 05/19/2015   HGBA1C 11.3 (H) 02/15/2015   HGBA1C 12.2 (H) 09/13/2014  05/27/2014: 12% 02/2014: 9%  She was on - off for 2 weeks -just took Ozempic and Humalog last night: - Metformin 1000 mg 2x a day with meals-restarted 01/2021 - Farxiga 10 mg before b'fast - Humalog (20-)25 units before every meal  - Basaglar >> Semglee 40 units 2x a day - Ozempic 0.5 mg weekly - started 01/2021 >> 1 mg weekly She was on Farxiga x 2 years in the past, but this was stopped b/c weight loss (205 >> 179 lbs), urinary frequency.  Tolerated well now. She tried regular metformin >> GI sxs.  She has a Dexcom CGM -checking sugars more than 4 times a day:    Previously:   Lowest sugar was 54 >> 100 >> 70s; it is unclear at which level she  has hypoglycemia unawareness. Highest sugar was 500 ... >> 400 >> 300s.  Pt's meals are: - Breakfast: cereal or bisquit - Lunch: hamburger, chinese - Dinner: chicken  - Snacks: 2-3: Frosted flakes! Fruit punch!  We discussed about the importance of stopping these. She is drinking sodas and shakes - less than before.  -No CKD: Last BUN/creatinine: 04/05/2021: 11/0.91, GFR 74, glucose 83 Lab Results  Component Value Date   BUN 15 04/03/2021   BUN 13 04/02/2021   CREATININE 1.03 (H) 04/03/2021   CREATININE 1.32 (H) 04/02/2021  12/28/2020: 23/0.95, glucose 143 02/10/2015: 13/0.83, GFR 86 11/16/2013: 20/0.92, GFR 79  -+ HL; lipid panel:  01/05/2022: 226/146/55/144 12/28/2020: 186/207/46/104 01/29/2019: 226/265/48/125 Lab Results  Component Value Date   CHOL 130 12/25/2017   HDL 35.90 (L) 12/25/2017   LDLCALC 77 12/25/2017   TRIG 248 (H) 01/24/2020   CHOLHDL 4 12/25/2017  02/10/2015: 185/168/44/107  On Crestor.  - last eye exam was 11/17/2021: + DR - [name].  -+ numbness and tingling in her feet recently.  Last foot exam 11/2021.  Hypothyroidism after RAI treatment for Graves' disease -History of medication noncompliance  Pt is on levothyroxine 100 mcg daily: - in am - fasting - at least 30 min from b'fast - no Ca, Fe, MVI - no PPIs - not on Biotin  Review TFTs: Lab  Results  Component Value Date   TSH 3.75 01/12/2021   TSH 0.38 (A) 01/14/2020   TSH 1.95 06/18/2019   TSH 1.028 06/10/2018   TSH 3.56 03/27/2018   FREET4 0.81 01/12/2021   FREET4 0.76 06/18/2019   FREET4 0.83 03/27/2018   FREET4 0.84 12/25/2017   FREET4 0.61 02/27/2016  07/28/2013: TSH 1.69  Pt denies: - feeling nodules in neck - hoarseness - dysphagia - choking  ROS + see HPI  I reviewed pt's medications, allergies, PMH, social hx, family hx, and changes were documented in the history of present illness. Otherwise, unchanged from my initial visit note.  Past Medical History:   Diagnosis Date   Anxiety    Chest pain of uncertain etiology    Diabetes mellitus without complication ([place])    Type 2   Hyperlipidemia    Palpitations    Thyroid disease    Urticaria    Past Surgical History:  Procedure Laterality Date   CESAREAN SECTION     UTERINE FIBROID EMBOLIZATION     Social History   Socioeconomic History   Marital status: Single    Spouse name: Not on file   Number of children: Not on file   Years of education: Not on file   Highest education level: Not on file  Occupational History   Not on file  Tobacco Use   Smoking status: Never   Smokeless tobacco: Never  Vaping Use   Vaping Use: Never used  Substance and Sexual Activity   Alcohol use: No    Alcohol/week: 0.0 standard drinks of alcohol   Drug use: Never   Sexual activity: Not on file  Other Topics Concern   Not on file  Social History Narrative   Not on file   Social Determinants of Health   Financial Resource Strain: Not on file  Food Insecurity: Not on file  Transportation Needs: Not on file  Physical Activity: Not on file  Stress: Not on file  Social Connections: Not on file  Intimate Partner Violence: Not on file   Current Outpatient Medications on File Prior to Visit  Medication Sig Dispense Refill   cetirizine (ZYRTEC) 10 MG tablet Take 1 tablet twice daily as needed 60 tablet 5   clobetasol (TEMOVATE) 0.05 % external solution Apply a small amount to scalp twice a day 25 mL 3   Continuous Blood Gluc Sensor (DEXCOM G7 SENSOR) MISC Use one sensor every 10 days 3 each 1   Continuous Blood Gluc Transmit (DEXCOM G6 TRANSMITTER) MISC Use every 3 (three) months. 1 each 3   dapagliflozin propanediol (FARXIGA) 10 MG TABS tablet TAKE 1 TABLET BY MOUTH DAILY 90 tablet 1   fluticasone (FLONASE) 50 MCG/ACT nasal spray USE 2 SPRAYS IN EACH NOSTRIL ONCE A DAY 48 g 3   fluticasone-salmeterol (ADVAIR DISKUS) 250-50 MCG/ACT AEPB Inhale 1 puff into the lungs in the morning and at bedtime.  60 each 11   glucose blood (ONETOUCH VERIO) test strip Use as instructed to check blood sugar daily 100 each 12   HYDROcodone-acetaminophen (NORCO/VICODIN) 5-325 MG tablet Take 1 tablet by mouth every 6 (six) hours as needed for 5 days. 20 tablet 0   ibuprofen (ADVIL) 800 MG tablet Take 1 tablet by mouth every 8 hours as needed for pain with food 60 tablet 0   insulin glargine-yfgn (SEMGLEE, YFGN,) 100 UNIT/ML Pen Inject 40 units into the skin every morning and 40 units every evening at bedtime 30 mL 0   insulin  lispro (HUMALOG KWIKPEN) 200 UNIT/ML KwikPen INJECT 20-25 UNITS UNDER THE SKIN BEFORE EVERY MEAL, AND 10-15 UNITS BEFORE A SNACK (UP TO 100 UNITS A DAY). 12 mL 0   Insulin Pen Needle (UNIFINE PENTIPS) 32G X 4 MM MISC Use 5 times a day 400 each 3   Lancets (FREESTYLE) lancets USE TO CHECK BLOOD SUGAR 4 TIMES DAILY 100 each 12   levothyroxine (SYNTHROID) 100 MCG tablet TAKE 1 TABLET BY MOUTH DAILY (must make appt for refills) 90 tablet 0   metFORMIN (GLUCOPHAGE XR) 500 MG 24 hr tablet Take 2 tablets (1,000 mg total) by mouth 2 (two) times daily with a meal. 360 tablet 3   methocarbamol (ROBAXIN) 500 MG tablet Take 1 tablet by mouth daily as needed 20 tablet 0   omeprazole (PRILOSEC) 40 MG capsule Take 1 capsule (40 mg total) by mouth 2 (two) times daily as needed. 180 capsule 1   ondansetron (ZOFRAN) 4 MG tablet Take 1 tablet  by mouth as directed. Take 1 tablet 30 - 60 minutes prior to each colonoscopy prep dose 2 tablet 0   OneTouch Delica Lancets 38S MISC Use to check blood sugar daily 100 each 11   rosuvastatin (CRESTOR) 5 MG tablet Take 1 tablet by mouth once a day 90 tablet 3   Semaglutide, 1 MG/DOSE, (OZEMPIC, 1 MG/DOSE,) 4 MG/3ML SOPN Inject 1 mg into the skin once a week. 9 mL 3   sertraline (ZOLOFT) 100 MG tablet Take 1 tablet by mouth Once a day 90 tablet 1   triamcinolone (NASACORT ALLERGY 24HR) 55 MCG/ACT AERO nasal inhaler Use 2 sparys in each nostril  Once a day 1 each 12    Vitamin D, Ergocalciferol, 50 MCG (2000 UT) CAPS Take 2,000 Units by mouth daily.     [DISCONTINUED] loratadine (CLARITIN) 10 MG tablet TAKE 1 TABLET BY MOUTH ONCE A DAY (Patient taking differently: Take 10 mg by mouth daily as needed for allergies.) 90 tablet 3   No current facility-administered medications on file prior to visit.   Allergies  Allergen Reactions   Bactrim [Sulfamethoxazole-Trimethoprim] Other (See Comments)    Appears to be causing DRESS   Bupropion Hives   Augmentin [Amoxicillin-Pot Clavulanate] Itching   Cyclobenzaprine Other (See Comments)    Excessive sleepiness   Lexapro [Escitalopram] Diarrhea and Nausea Only   Pravastatin Other (See Comments)    Leg cramps/ mylagia   Atorvastatin Other (See Comments)    Leg cramps   Doxepin Hcl Other (See Comments)    Nightmares    Family History  Problem Relation Age of Onset   Dementia Mother    Diabetes Mother    Stroke Father    Colon polyps Neg Hx    Colon cancer Neg Hx    Esophageal cancer Neg Hx    Stomach cancer Neg Hx    Rectal cancer Neg Hx     PE: BP 138/82 (BP Location: Left Arm, Patient Position: Sitting, Cuff Size: Normal)   Pulse 79   Ht 5' 5.5" (1.664 m)   Wt 186 lb 12.8 oz (84.7 kg)   LMP 07/10/2012   SpO2 98%   BMI 30.61 kg/m    Wt Readings from Last 3 Encounters:  06/08/22 186 lb 12.8 oz (84.7 kg)  05/03/22 188 lb 3.2 oz (85.4 kg)  04/13/22 188 lb (85.3 kg)   Constitutional: overweight, in NAD Eyes:  EOMI, no exophthalmos ENT: no neck masses, no cervical lymphadenopathy Cardiovascular: RRR, No MRG Respiratory: CTA B Musculoskeletal:  no deformities Skin:no rashes Neurological: no tremor with outstretched hands  ASSESSMENT: 1. DM2, insulin-dependent, uncontrolled, with complications - DR  No family history of medullary thyroid cancer or personal history of pancreatitis.  2. Hypothyroidism - postablative for Graves ds.  3. HL  PLAN:  1.  Patient with history of uncontrolled  type 2 diabetes, with spectacular improvement in her diabetes control after she cut down sweet drinks -HbA1c decreased from 11.9% to 6.9%.  She also lost 15 pounds.  However, afterwards, she was lost for follow-up and sugars were much higher at last visit, after she restarted to drink sodas and had steroid injections.  Also, she was not taking Humalog correctly at last visit, taking it after, rather than 15 minutes before meals and she was also missing doses. -At last visit I strongly advised her to stop any sweet drinks, and to take Humalog 15 minutes before each meal.  I also advised her to increase Ozempic to 1 mg weekly.  She was also planning to start an exercise class.  At that time she had hypoglycemic symptoms including increased urination, blurry vision and neuropathic symptoms.  HbA1c was 8.5%, higher. CGM interpretation: -At today's visit, we reviewed her CGM downloads: It appears that 34% of values are in target range (goal >70%), while 66% are higher than 180 (goal <25%), and 0% are lower than 70 (goal <4%).  The calculated average blood sugar is 205.  The projected HbA1c for the next 3 months (GMI) is 8.2%. -Reviewing the CGM trends, sugars are still mostly high, fluctuating about the upper limit of the target range with hyperglycemic values after breakfast, lunch, and dinner, more pronounced in the second half of the day and especially after dinner. -Upon questioning, for the last 2 weeks she had decreased appetite and she was off all of her diabetic regimen!  She just restarted last night to take Ozempic and Humalog.  She took Humalog after dinner and subsequently she dropped her sugars to the 70s overnight. At today's visit, I advised her that due to the poor control in the last 2 weeks, I cannot clear her for surgery. -To improve her appetite, I advised her to cut back on the Ozempic dose.  Also, she absolutely needs basal insulin but I advised her to start at the lower dose, at 40 is daily.   If she eats, she needs Humalog but I advised her to take a lower dose and always take it 15 minutes before the meal.  We will continue [place] for now.  Since she does describe nausea with metformin, I advised her to reduce the dose to only once a day.  Last but not least, she absolutely needs to stop drinking sweet drinks. -I advised her to get in touch with me in 2 weeks so I can review her CGM tracings and I may clear her for surgery at that time. - I suggested to:  Patient Instructions  Please change: - Metformin 1000 mg with dinner - Farxiga 10 mg before b'fast - Humalog 12-20 units 15 min before every meal (only takes this if you eat) - Semglee 40 units 1x a day - Ozempic 0.5 mg weekly (36 clicks on the 1 mg pen)  STOP SWEET DRINKS!  Please return in 4 months.   - we checked her HbA1c: 7.7% (lower) - advised to check sugars at different times of the day - 4x a day, rotating check times - advised for yearly eye exams >> she is UTD -  return to clinic in 4 months  2. Hypothyroidism - latest thyroid labs reviewed with pt. >> normal: Lab Results  Component Value Date   TSH 3.75 01/12/2021  - she continues on LT4 100 mcg daily - pt feels good on this dose. - we discussed about taking the thyroid hormone every day, with water, >30 minutes before breakfast, separated by >4 hours from acid reflux medications, calcium, iron, multivitamins. Pt. is taking it correctly. - will check thyroid tests today: TSH and fT4 - If labs are abnormal, she will need to return for repeat TFTs in 1.5 months  3. HL  - Reviewed latest lipid panel from 01/05/2022: 226/146/55/144 -LDL elevated - Continues Crestor 5 mg daily without side effects. -We did discuss about improving diet at last visit  Component     Latest Ref Rng 06/08/2022  TSH     0.35 - 5.50 uIU/mL 31.90 (H)   T4,Free(Direct)     0.60 - 1.60 ng/dL 0.55 (L)     TSH very high while at last visit, TSH was normal on the same dose.  I  suspect noncompliance.  We will continue the same thyroxine dose and will advise her to take it daily, correctly, and repeat blood tests in 5 weeks. I would suggest against surgery until we make sure her thyroid status improves.  Philemon Kingdom, MD PhD [facility]

## 2022-06-08 NOTE — Addendum Note (Signed)
Addended by: Philemon Kingdom on: 06/08/2022 04:47 PM   Modules accepted: Orders

## 2022-06-11 ENCOUNTER — Telehealth: Payer: Self-pay

## 2022-06-11 ENCOUNTER — Telehealth: Payer: Self-pay | Admitting: Pulmonary Disease

## 2022-06-11 NOTE — Telephone Encounter (Signed)
No respiratory preclusion to surgery  Cleared for surgery from a respiratory perspective

## 2022-06-11 NOTE — Telephone Encounter (Signed)
Called and spoke with patient. She verbalized understanding.   Nothing further needed.  

## 2022-06-11 NOTE — Telephone Encounter (Signed)
OV notes and clearance form have been faxed back to [facility]. Nothing further needed at this time. ?

## 2022-06-11 NOTE — Telephone Encounter (Signed)
Fax received from Dr. Susa Day with [facility] to perform a Rotator Cuff repair on patient.  Patient needs surgery clearance. Surgery is PENDING. Patient was seen on 05/03/2022. Office protocol is a risk assessment can be sent to surgeon if patient has been seen in 60 days or less.   Sending to Dr.O for risk assessment or recommendations if patient needs to be seen in office prior to surgical procedure.

## 2022-06-11 NOTE — Telephone Encounter (Signed)
Returned patients call regarding lab appt. Pt 5 wk f/u for labs has been scheduled and advised the patient that per Dr. Cruzita Lederer she is to inform us within 2 weeks (10/13) for her sugars to be reviewed and Dr. Cruzita Lederer may clear her for surgery per last OV.

## 2022-06-12 ENCOUNTER — Other Ambulatory Visit: Payer: Self-pay | Admitting: Internal Medicine

## 2022-06-12 ENCOUNTER — Other Ambulatory Visit (HOSPITAL_COMMUNITY): Payer: Self-pay

## 2022-06-12 MED ORDER — METFORMIN HCL ER 500 MG PO TB24
1000.0000 mg | ORAL_TABLET | Freq: Two times a day (BID) | ORAL | 3 refills | Status: DC
  Filled 2022-06-12: qty 360, 90d supply, fill #0

## 2022-06-13 ENCOUNTER — Other Ambulatory Visit (HOSPITAL_COMMUNITY): Payer: Self-pay

## 2022-06-14 ENCOUNTER — Other Ambulatory Visit (HOSPITAL_COMMUNITY): Payer: Self-pay

## 2022-06-19 NOTE — Telephone Encounter (Signed)
Pt contacted and advised From the diabetes point of view, I see no problems going on with the surgery.  However, her TSH was also high, at 31, so unfortunately I cannot clear her for surgery due to this.  Please tell her to take the medication consistently and lets have her back for repeat TSH and free T4 in 2 weeks.  If these are better, I will clear her.  At that time I will need to look again at the CGM.  Please tell her to continue exactly how she is doing now in terms of diabetes management. Lab appt scheduled for 2 weeks.

## 2022-06-19 NOTE — Telephone Encounter (Signed)
Pt contacted office for provider to review Dexcom readings for surgery clearance.

## 2022-06-19 NOTE — Telephone Encounter (Signed)
Unbelievable improvement in her blood sugars!!!! - see below:    From the diabetes point of view, I see no problems going on with the surgery.  However, her TSH was also high, at 31, so unfortunately I cannot clear her for surgery due to this.  Please tell her to take the medication consistently and lets have her back for repeat TSH and free T4 in 2 weeks.  If these are better, I will clear her.  At that time I will need to look again at the CGM.  Please tell her to continue exactly how she is doing now in terms of diabetes management.

## 2022-06-20 ENCOUNTER — Other Ambulatory Visit (HOSPITAL_COMMUNITY): Payer: Self-pay

## 2022-07-02 ENCOUNTER — Other Ambulatory Visit (INDEPENDENT_AMBULATORY_CARE_PROVIDER_SITE_OTHER): Payer: No Typology Code available for payment source

## 2022-07-02 ENCOUNTER — Other Ambulatory Visit: Payer: Self-pay | Admitting: Internal Medicine

## 2022-07-02 DIAGNOSIS — E890 Postprocedural hypothyroidism: Secondary | ICD-10-CM

## 2022-07-03 ENCOUNTER — Other Ambulatory Visit (HOSPITAL_COMMUNITY): Payer: Self-pay

## 2022-07-03 LAB — T4, FREE: Free T4: 0.92 ng/dL (ref 0.60–1.60)

## 2022-07-03 LAB — TSH: TSH: 2.15 u[IU]/mL (ref 0.35–5.50)

## 2022-07-03 MED ORDER — HUMALOG KWIKPEN 200 UNIT/ML ~~LOC~~ SOPN
20.0000 [IU] | PEN_INJECTOR | Freq: Three times a day (TID) | SUBCUTANEOUS | 0 refills | Status: DC
  Filled 2022-07-03: qty 12, 28d supply, fill #0

## 2022-07-04 ENCOUNTER — Other Ambulatory Visit (HOSPITAL_COMMUNITY): Payer: Self-pay

## 2022-07-04 MED ORDER — SERTRALINE HCL 100 MG PO TABS
100.0000 mg | ORAL_TABLET | Freq: Every day | ORAL | 1 refills | Status: DC
  Filled 2022-07-04: qty 90, 90d supply, fill #0

## 2022-07-04 NOTE — Telephone Encounter (Signed)
Pt contacted and advised approved for surgery and forms completed and faxed to [facility]

## 2022-07-10 ENCOUNTER — Other Ambulatory Visit (HOSPITAL_COMMUNITY): Payer: Self-pay

## 2022-07-12 ENCOUNTER — Ambulatory Visit: Payer: Self-pay | Admitting: Orthopedic Surgery

## 2022-07-12 NOTE — H&P (View-Only) (Signed)
Samantha [name] is an 56 y.o. female.   Chief Complaint: R shoulder pain HPI: Reason for Visit: (normal) review of test results (right shoulder mri results) Location (Upper Extremity): shoulder pain on the right Severity: pain level 7/10 Aggravating Factors: ROM Alleviating Factors: 1 session in PT Medications: Ibuprofen and Hydrocodone  Past Medical History:  Diagnosis Date   Anxiety    Chest pain of uncertain etiology    Diabetes mellitus without complication ([place])    Type 2   Hyperlipidemia    Palpitations    Thyroid disease    Urticaria     Past Surgical History:  Procedure Laterality Date   CESAREAN SECTION     UTERINE FIBROID EMBOLIZATION      Family History  Problem Relation Age of Onset   Dementia Mother    Diabetes Mother    Stroke Father    Colon polyps Neg Hx    Colon cancer Neg Hx    Esophageal cancer Neg Hx    Stomach cancer Neg Hx    Rectal cancer Neg Hx    Social History:  reports that she has never smoked. She has never used smokeless tobacco. She reports that she does not drink alcohol and does not use drugs.  Allergies:  Allergies  Allergen Reactions   Bactrim [Sulfamethoxazole-Trimethoprim] Other (See Comments)    Appears to be causing DRESS   Bupropion Hives   Augmentin [Amoxicillin-Pot Clavulanate] Itching   Cyclobenzaprine Other (See Comments)    Excessive sleepiness   Lexapro [Escitalopram] Diarrhea and Nausea Only   Pravastatin Other (See Comments)    Leg cramps/ mylagia   Atorvastatin Other (See Comments)    Leg cramps   Doxepin Hcl Other (See Comments)    Nightmares    Current meds: Advair Diskus 250 mcg-50 mcg/dose powder for inhalation 02/23/22   filled surescripts Basaglar KwikPen U-100 Insulin 100 unit/mL (3 mL) subcutaneous clobetasoL 0.05 % scalp solution Farxiga 10 mg tablet fluticasone propionate 50 mcg/actuation nasal spray,suspension HumaLOG KwikPen U-200 Insulin 200 unit/mL (3 mL) subcutaneous HYDROcodone 5  mg-acetaminophen 325 mg tablet ibuprofen 800 mg tablet levothyroxine 100 mcg tablet metFORMIN ER 500 mg tablet,extended release 24 hr ondansetron HCL 4 mg tablet Ozempic 1 mg/dose (4 mg/3 mL) subcutaneous pen injector rosuvastatin 5 mg tablet Semglee (insulin glargine-yfgn) Pen 100 unit/mL (3 mL) subcutaneous sertraline 100 mg tablet sodium,potassium,mag sulfates 17.5 gram-3.13 gram-1.6 gram oral soln Vitamin D  Review of Systems  Constitutional: Negative.   HENT: Negative.    Eyes: Negative.   Respiratory: Negative.    Cardiovascular: Negative.   Gastrointestinal: Negative.   Endocrine: Negative.   Genitourinary: Negative.   Musculoskeletal:  Positive for arthralgias and myalgias.  Skin: Negative.   Neurological: Negative.   Psychiatric/Behavioral: Negative.      Last menstrual period 07/10/2012. Physical Exam Constitutional:      Appearance: Normal appearance.  HENT:     Head: Normocephalic and atraumatic.     Right Ear: External ear normal.     Left Ear: External ear normal.     Nose: Nose normal.     Mouth/Throat:     Pharynx: Oropharynx is clear.  Eyes:     Conjunctiva/sclera: Conjunctivae normal.  Cardiovascular:     Rate and Rhythm: Normal rate and regular rhythm.     Pulses: Normal pulses.  Pulmonary:     Effort: Pulmonary effort is normal.  Abdominal:     General: Bowel sounds are normal.  Musculoskeletal:     Cervical back:  Normal range of motion.     Comments: Constitutional: General Appearance: healthy-appearing and NAD.  Psychiatric: Mood and Affect: normal mood and affect.  Cardiovascular System: Arterial Pulses Right: radial normal and brachial normal. Varicosities Right: no varicosities.  C-Spine/Neck: Active Range of Motion: flexion normal, extension normal, and no pain elicited on motion.  Shoulders: Inspection Right: no misalignment, atrophy, erythema, swelling, or scapular winging. Bony Palpation Right: no tenderness of the sternoclavicular  joint, the coracoid process, the acromioclavicular joint, the bicipital groove, or the scapula. Soft Tissue Palpation Right: tenderness of the supraspinatus and the subacromial bursa. Active Range of Motion Right: limited. Special Tests Right: Speed's test negative and Neer's test positive. Stability Right: no laxity, sulcus sign negative, and anterior apprehension test negative. Strength Right: abduction 5/5, adduction 5/5, flexion 5/5, and extension 5/5.  Skin: Right Upper Extremity: normal.  Neurological System: Biceps Reflex Right: normal (2). Brachioradialis Reflex Right: normal (2). Triceps Reflex Right: normal (2). Sensation on the Right: C5 normal, C6 normal, and C7 normal.  Some tenderness over the [facility]  Neurological:     Mental Status: She is alert.    MRI of the shoulder demonstrates high-grade tear supraspinatus infraspinatus with a new full thickness tear along the inferior subscap. Chronic AC arthrosis. Mild glenohumeral arthrosis  Assessment/Plan Impression:  Progressive high-grade tear of the right shoulder  Plan:  We discussed options and we mutually agreed to proceed with a rotator cuff repair subacromial decompression and evaluation of the labrum possible clavicular plasty.  An extensive discussion concerning the pathology relevant anatomy and treatment options. After that discussion we mutually agreed to proceed with repair of the rotator cuff utilizing arthroscopic assistance if possible. The risks and benefits of that procedure were discussed including bleeding, infection, suboptimal range of motion, deep venous thrombosis, pulmonary embolism, anesthetic complications etc. in addition we discussed the postoperative course to include approximately 4 weeks of passive range of motion followed by 4 weeks of active range of motion followed by 4-12 weeks of progressive strengthening exercises. In addition we discussed protective activities to reduce the risk of a reinjury including  impingement activities with elbow above the shoulder as well as reaching and repetitive circular motion activities. The hospital stay will either be as a outpatient with a regional block versus overnight depending upon the extent of the procedure and any challenging health issues with a first postoperative visit 2 weeks following the surgery.  Patient is insulin-dependent diabetic. No history of MRSA or DVT.  Plan Right shoulder scope, SAD, debridement, mini-open RCR  Cecilie Kicks, PA-C for Dr Tonita Cong 07/12/2022, 1:28 PM

## 2022-07-12 NOTE — H&P (Signed)
Samantha [name] is an 56 y.o. female.   Chief Complaint: R shoulder pain HPI: Reason for Visit: (normal) review of test results (right shoulder mri results) Location (Upper Extremity): shoulder pain on the right Severity: pain level 7/10 Aggravating Factors: ROM Alleviating Factors: 1 session in PT Medications: Ibuprofen and Hydrocodone  Past Medical History:  Diagnosis Date   Anxiety    Chest pain of uncertain etiology    Diabetes mellitus without complication ([place])    Type 2   Hyperlipidemia    Palpitations    Thyroid disease    Urticaria     Past Surgical History:  Procedure Laterality Date   CESAREAN SECTION     UTERINE FIBROID EMBOLIZATION      Family History  Problem Relation Age of Onset   Dementia Mother    Diabetes Mother    Stroke Father    Colon polyps Neg Hx    Colon cancer Neg Hx    Esophageal cancer Neg Hx    Stomach cancer Neg Hx    Rectal cancer Neg Hx    Social History:  reports that she has never smoked. She has never used smokeless tobacco. She reports that she does not drink alcohol and does not use drugs.  Allergies:  Allergies  Allergen Reactions   Bactrim [Sulfamethoxazole-Trimethoprim] Other (See Comments)    Appears to be causing DRESS   Bupropion Hives   Augmentin [Amoxicillin-Pot Clavulanate] Itching   Cyclobenzaprine Other (See Comments)    Excessive sleepiness   Lexapro [Escitalopram] Diarrhea and Nausea Only   Pravastatin Other (See Comments)    Leg cramps/ mylagia   Atorvastatin Other (See Comments)    Leg cramps   Doxepin Hcl Other (See Comments)    Nightmares    Current meds: Advair Diskus 250 mcg-50 mcg/dose powder for inhalation 02/23/22   filled surescripts Basaglar KwikPen U-100 Insulin 100 unit/mL (3 mL) subcutaneous clobetasoL 0.05 % scalp solution Farxiga 10 mg tablet fluticasone propionate 50 mcg/actuation nasal spray,suspension HumaLOG KwikPen U-200 Insulin 200 unit/mL (3 mL) subcutaneous HYDROcodone 5  mg-acetaminophen 325 mg tablet ibuprofen 800 mg tablet levothyroxine 100 mcg tablet metFORMIN ER 500 mg tablet,extended release 24 hr ondansetron HCL 4 mg tablet Ozempic 1 mg/dose (4 mg/3 mL) subcutaneous pen injector rosuvastatin 5 mg tablet Semglee (insulin glargine-yfgn) Pen 100 unit/mL (3 mL) subcutaneous sertraline 100 mg tablet sodium,potassium,mag sulfates 17.5 gram-3.13 gram-1.6 gram oral soln Vitamin D  Review of Systems  Constitutional: Negative.   HENT: Negative.    Eyes: Negative.   Respiratory: Negative.    Cardiovascular: Negative.   Gastrointestinal: Negative.   Endocrine: Negative.   Genitourinary: Negative.   Musculoskeletal:  Positive for arthralgias and myalgias.  Skin: Negative.   Neurological: Negative.   Psychiatric/Behavioral: Negative.      Last menstrual period 07/10/2012. Physical Exam Constitutional:      Appearance: Normal appearance.  HENT:     Head: Normocephalic and atraumatic.     Right Ear: External ear normal.     Left Ear: External ear normal.     Nose: Nose normal.     Mouth/Throat:     Pharynx: Oropharynx is clear.  Eyes:     Conjunctiva/sclera: Conjunctivae normal.  Cardiovascular:     Rate and Rhythm: Normal rate and regular rhythm.     Pulses: Normal pulses.  Pulmonary:     Effort: Pulmonary effort is normal.  Abdominal:     General: Bowel sounds are normal.  Musculoskeletal:     Cervical back:  Normal range of motion.     Comments: Constitutional: General Appearance: healthy-appearing and NAD.  Psychiatric: Mood and Affect: normal mood and affect.  Cardiovascular System: Arterial Pulses Right: radial normal and brachial normal. Varicosities Right: no varicosities.  C-Spine/Neck: Active Range of Motion: flexion normal, extension normal, and no pain elicited on motion.  Shoulders: Inspection Right: no misalignment, atrophy, erythema, swelling, or scapular winging. Bony Palpation Right: no tenderness of the sternoclavicular  joint, the coracoid process, the acromioclavicular joint, the bicipital groove, or the scapula. Soft Tissue Palpation Right: tenderness of the supraspinatus and the subacromial bursa. Active Range of Motion Right: limited. Special Tests Right: Speed's test negative and Neer's test positive. Stability Right: no laxity, sulcus sign negative, and anterior apprehension test negative. Strength Right: abduction 5/5, adduction 5/5, flexion 5/5, and extension 5/5.  Skin: Right Upper Extremity: normal.  Neurological System: Biceps Reflex Right: normal (2). Brachioradialis Reflex Right: normal (2). Triceps Reflex Right: normal (2). Sensation on the Right: C5 normal, C6 normal, and C7 normal.  Some tenderness over the [facility]  Neurological:     Mental Status: She is alert.    MRI of the shoulder demonstrates high-grade tear supraspinatus infraspinatus with a new full thickness tear along the inferior subscap. Chronic AC arthrosis. Mild glenohumeral arthrosis  Assessment/Plan Impression:  Progressive high-grade tear of the right shoulder  Plan:  We discussed options and we mutually agreed to proceed with a rotator cuff repair subacromial decompression and evaluation of the labrum possible clavicular plasty.  An extensive discussion concerning the pathology relevant anatomy and treatment options. After that discussion we mutually agreed to proceed with repair of the rotator cuff utilizing arthroscopic assistance if possible. The risks and benefits of that procedure were discussed including bleeding, infection, suboptimal range of motion, deep venous thrombosis, pulmonary embolism, anesthetic complications etc. in addition we discussed the postoperative course to include approximately 4 weeks of passive range of motion followed by 4 weeks of active range of motion followed by 4-12 weeks of progressive strengthening exercises. In addition we discussed protective activities to reduce the risk of a reinjury including  impingement activities with elbow above the shoulder as well as reaching and repetitive circular motion activities. The hospital stay will either be as a outpatient with a regional block versus overnight depending upon the extent of the procedure and any challenging health issues with a first postoperative visit 2 weeks following the surgery.  Patient is insulin-dependent diabetic. No history of MRSA or DVT.  Plan Right shoulder scope, SAD, debridement, mini-open RCR  Cecilie Kicks, PA-C for Dr Tonita Cong 07/12/2022, 1:28 PM

## 2022-07-13 ENCOUNTER — Other Ambulatory Visit: Payer: No Typology Code available for payment source

## 2022-07-17 ENCOUNTER — Other Ambulatory Visit: Payer: Self-pay | Admitting: Internal Medicine

## 2022-07-17 ENCOUNTER — Other Ambulatory Visit (HOSPITAL_COMMUNITY): Payer: Self-pay

## 2022-07-17 MED ORDER — LEVOTHYROXINE SODIUM 100 MCG PO TABS
100.0000 ug | ORAL_TABLET | Freq: Every day | ORAL | 0 refills | Status: DC
  Filled 2022-07-17 – 2022-11-05 (×2): qty 30, 30d supply, fill #0

## 2022-07-17 NOTE — Progress Notes (Signed)
COVID Vaccine Completed:  Yes  Date of COVID positive in last 90 days:  PCP - Harlan Stains, MD Cardiologist - Oswaldo Milian, MD Pulmonologist - Lawrence Santiago, MD  Chest x-ray - 01-24-22 Epic EKG -  Stress Test -  ECHO - 02-23-20 Epic Cardiac Cath -  Pacemaker/ICD device last checked: Spinal Cord Stimulator:  Bowel Prep -   Sleep Study - Yes,  CPAP -   Fasting Blood Sugar -  Checks Blood Sugar _____ times a day  Semaglutide Last dose of GLP1 agonist-  N/A GLP1 instructions:  N/A   [place] Last dose of SGLT-2 inhibitors-  N/A SGLT-2 instructions: N/A   Blood Thinner Instructions: Aspirin Instructions: Last Dose:  Activity level:  Can go up a flight of stairs and perform activities of daily living without stopping and without symptoms of chest pain or shortness of breath.  Able to exercise without symptoms  Unable to go up a flight of stairs without symptoms of     Anesthesia review: Chest pain and palpitations  evaluated by cardiology.  Shortness of breath followed by pulmonology  Patient denies shortness of breath, fever, cough and chest pain at PAT appointment  Patient verbalized understanding of instructions that were given to them at the PAT appointment. Patient was also instructed that they will need to review over the PAT instructions again at home before surgery.

## 2022-07-17 NOTE — Patient Instructions (Signed)
SURGICAL WAITING ROOM VISITATION Patients having surgery or a procedure may have no more than 2 support people in the waiting area - these visitors may rotate.   Children under the age of 65 must have an adult with them who is not the patient. If the patient needs to stay at the hospital during part of their recovery, the visitor guidelines for inpatient rooms apply. Pre-op nurse will coordinate an appropriate time for 1 support person to accompany patient in pre-op.  This support person may not rotate.    Please refer to the [facility] website for the visitor guidelines for Inpatients (after your surgery is over and you are in a regular room).      Your procedure is scheduled on: 07-25-22   Report to [facility] Main Entrance    Report to admitting at 9:15 AM   Call this number if you have problems the morning of surgery [phone]   Do not eat food :After Midnight.   After Midnight you may have the following liquids until 8:30 AM DAY OF SURGERY  Water Non-Citrus Juices (without pulp, NO RED) Carbonated Beverages Black Coffee (NO MILK/CREAM OR CREAMERS, sugar ok)  Clear Tea (NO MILK/CREAM OR CREAMERS, sugar ok) regular and decaf                             Plain Jell-O (NO RED)                                           Fruit ices (not with fruit pulp, NO RED)                                     Popsicles (NO RED)                                                               Sports drinks like Gatorade (NO RED)                   The day of surgery:  Drink ONE (1) Pre-Surgery G2 at 8:30 AM the morning of surgery. Drink in one sitting. Do not sip.  This drink was given to you during your hospital  pre-op appointment visit. Nothing else to drink after completing the Pre-Surgery G2.          If you have questions, please contact your surgeon's office.   FOLLOW  ANY ADDITIONAL PRE OP INSTRUCTIONS YOU RECEIVED FROM YOUR SURGEON'S OFFICE!!!     Oral Hygiene is also  important to reduce your risk of infection.                                    Remember - BRUSH YOUR TEETH THE MORNING OF SURGERY WITH YOUR REGULAR TOOTHPASTE   Do NOT smoke after Midnight   Take these medicines the morning of surgery with A SIP OF WATER: Zyrtec Levothyroxine Omeprazole Rosuvastatin Sertraline  Okay to use inhalers   How to  Manage Your Diabetes Before and After Surgery  Why is it important to control my blood sugar before and after surgery? Improving blood sugar levels before and after surgery helps healing and can limit problems. A way of improving blood sugar control is eating a healthy diet by:  Eating less sugar and carbohydrates  Increasing activity/exercise  Talking with your doctor about reaching your blood sugar goals High blood sugars (greater than 180 mg/dL) can raise your risk of infections and slow your recovery, so you will need to focus on controlling your diabetes during the weeks before surgery. Make sure that the doctor who takes care of your diabetes knows about your planned surgery including the date and location.  How do I manage my blood sugar before surgery? Check your blood sugar at least 4 times a day, starting 2 days before surgery, to make sure that the level is not too high or low. Check your blood sugar the morning of your surgery when you wake up and every 2 hours until you get to the Short Stay unit. If your blood sugar is less than 70 mg/dL, you will need to treat for low blood sugar: Do not take insulin. Treat a low blood sugar (less than 70 mg/dL) with  cup of clear juice (cranberry or apple), 4 glucose tablets, OR glucose gel. Recheck blood sugar in 15 minutes after treatment (to make sure it is greater than 70 mg/dL). If your blood sugar is not greater than 70 mg/dL on recheck, call [phone] for further instructions. Report your blood sugar to the short stay nurse when you get to Short Stay.  If you are admitted to the  hospital after surgery: Your blood sugar will be checked by the staff and you will probably be given insulin after surgery (instead of oral diabetes medicines) to make sure you have good blood sugar levels. The goal for blood sugar control after surgery is 80-180 mg/dL.   WHAT DO I DO ABOUT MY DIABETES MEDICATION?  Do not take oral diabetes medicines (pills) the morning of surgery.        Hold Farxiga 3 days before surgery (do not take after 07/21/22)        Hold Semaglutide 7 days before surgery (do no take after 07/18/22)  THE NIGHT BEFORE SURGERY:  Take 20 units of Insuline Glargine            Do not take a bedtime dose of Insulin Lispro.     THE MORNING OF SURGERY:  If your CBG is greater than 220 mg/dL, you may take  of your sliding scale Insulin Lispro   DO NOT TAKE THE FOLLOWING 7 DAYS PRIOR TO SURGERY: Ozempic, Wegovy, Rybelsus (Semaglutide), Byetta (exenatide), Bydureon (exenatide ER), Victoza, Saxenda (liraglutide), or Trulicity (dulaglutide) Mounjaro (Tirzepatide) Adlyxin (Lixisenatide), Polyethylene Glycol Loxenatide.    Reviewed and Endorsed by [facility] Patient Education Committee, August 2015                              You may not have any metal on your body including hair pins, jewelry, and body piercing             Do not wear make-up, lotions, powders, perfumes or deodorant  Do not wear nail polish including gel and S&S, artificial/acrylic nails, or any other type of covering on natural nails including finger and toenails. If you have artificial nails, gel coating, etc. that needs to be  removed by a nail salon please have this removed prior to surgery or surgery may need to be canceled/ delayed if the surgeon/ anesthesia feels like they are unable to be safely monitored.   Do not shave  48 hours prior to surgery.    Do not bring valuables to the hospital. [place].   Contacts, dentures or bridgework may not be worn into  surgery.  DO NOT [place]. PHARMACY WILL DISPENSE MEDICATIONS LISTED ON YOUR MEDICATION LIST TO YOU DURING YOUR ADMISSION [place]!    Patients discharged on the day of surgery will not be allowed to drive home.  Someone NEEDS to stay with you for the first 24 hours after anesthesia.               Please read over the following fact sheets you were given: IF [place]  If you received a COVID test during your pre-op visit  it is requested that you wear a mask when out in public, stay away from anyone that may not be feeling well and notify your surgeon if you develop symptoms. If you test positive for Covid or have been in contact with anyone that has tested positive in the last 10 days please notify you surgeon.  [place] - Preparing for Surgery Before surgery, you can play an important role.  Because skin is not sterile, your skin needs to be as free of germs as possible.  You can reduce the number of germs on your skin by washing with CHG (chlorahexidine gluconate) soap before surgery.  CHG is an antiseptic cleaner which kills germs and bonds with the skin to continue killing germs even after washing. Please DO NOT use if you have an allergy to CHG or antibacterial soaps.  If your skin becomes reddened/irritated stop using the CHG and inform your nurse when you arrive at Short Stay. Do not shave (including legs and underarms) for at least 48 hours prior to the first CHG shower.  You may shave your face/neck.  Please follow these instructions carefully:  1.  Shower with CHG Soap the night before surgery and the  morning of surgery.  2.  If you choose to wash your hair, wash your hair first as usual with your normal  shampoo.  3.  After you shampoo, rinse your hair and body thoroughly to remove the shampoo.                             4.  Use CHG as you would any other liquid soap.   You can apply chg directly to the skin and wash.  Gently with a scrungie or clean washcloth.  5.  Apply the CHG Soap to your body ONLY FROM THE NECK DOWN.   Do   not use on face/ open                           Wound or open sores. Avoid contact with eyes, ears mouth and   genitals (private parts).                       Wash face,  Genitals (private parts) with your normal soap.             6.  Wash thoroughly, paying special attention to the area where your    surgery  will be performed.  7.  Thoroughly rinse your body with warm water from the neck down.  8.  DO NOT shower/wash with your normal soap after using and rinsing off the CHG Soap.                9.  Pat yourself dry with a clean towel.            10.  Wear clean pajamas.            11.  Place clean sheets on your bed the night of your first shower and do not  sleep with pets. Day of Surgery : Do not apply any lotions/deodorants the morning of surgery.  Please wear clean clothes to the hospital/surgery center.  FAILURE TO FOLLOW THESE INSTRUCTIONS MAY RESULT IN THE CANCELLATION OF YOUR SURGERY  PATIENT SIGNATURE_________________________________  NURSE SIGNATURE__________________________________  ________________________________________________________________________    Samantha [name]  An incentive spirometer is a tool that can help keep your lungs clear and active. This tool measures how well you are filling your lungs with each breath. Taking long deep breaths may help reverse or decrease the chance of developing breathing (pulmonary) problems (especially infection) following: A long period of time when you are unable to move or be active. BEFORE THE PROCEDURE  If the spirometer includes an indicator to show your best effort, your nurse or respiratory therapist will set it to a desired goal. If possible, sit up straight or lean slightly forward. Try not to slouch. Hold the incentive spirometer in an upright  position. INSTRUCTIONS FOR USE  Sit on the edge of your bed if possible, or sit up as far as you can in bed or on a chair. Hold the incentive spirometer in an upright position. Breathe out normally. Place the mouthpiece in your mouth and seal your lips tightly around it. Breathe in slowly and as deeply as possible, raising the piston or the ball toward the top of the column. Hold your breath for 3-5 seconds or for as long as possible. Allow the piston or ball to fall to the bottom of the column. Remove the mouthpiece from your mouth and breathe out normally. Rest for a few seconds and repeat Steps 1 through 7 at least 10 times every 1-2 hours when you are awake. Take your time and take a few normal breaths between deep breaths. The spirometer may include an indicator to show your best effort. Use the indicator as a goal to work toward during each repetition. After each set of 10 deep breaths, practice coughing to be sure your lungs are clear. If you have an incision (the cut made at the time of surgery), support your incision when coughing by placing a pillow or rolled up towels firmly against it. Once you are able to get out of bed, walk around indoors and cough well. You may stop using the incentive spirometer when instructed by your caregiver.  RISKS AND COMPLICATIONS Take your time so you do not get dizzy or light-headed. If you are in pain, you may need to take or ask for pain medication before doing incentive spirometry. It is harder to take a deep breath if you are having pain. AFTER USE Rest and breathe slowly and easily. It can be helpful to keep track of a log of your progress. Your caregiver can provide you with a simple table to help with this. If you  are using the spirometer at home, follow these instructions: [place] IF:  You are having difficultly using the spirometer. You have trouble using the spirometer as often as instructed. Your pain medication is not giving  enough relief while using the spirometer. You develop fever of 100.5 F (38.1 C) or higher. SEEK IMMEDIATE MEDICAL CARE IF:  You cough up bloody sputum that had not been present before. You develop fever of 102 F (38.9 C) or greater. You develop worsening pain at or near the incision site. MAKE SURE YOU:  Understand these instructions. Will watch your condition. Will get help right away if you are not doing well or get worse. Document Released: 01/07/2007 Document Revised: 11/19/2011 Document Reviewed: 03/10/2007 [facility] Patient Information 2014 [place], [place].   ________________________________________________________________________

## 2022-07-18 ENCOUNTER — Encounter (HOSPITAL_COMMUNITY): Payer: Self-pay

## 2022-07-18 ENCOUNTER — Encounter (HOSPITAL_COMMUNITY)
Admission: RE | Admit: 2022-07-18 | Discharge: 2022-07-18 | Disposition: A | Discharge status: Home / Self Care | Payer: No Typology Code available for payment source | Source: Ambulatory Visit | Attending: Specialist | Admitting: Specialist

## 2022-07-18 ENCOUNTER — Other Ambulatory Visit: Payer: Self-pay

## 2022-07-18 VITALS — BP 142/97 | HR 98 | Temp 98.7°F | Resp 16 | Ht 65.5 in | Wt 180.2 lb

## 2022-07-18 DIAGNOSIS — E119 Type 2 diabetes mellitus without complications: Secondary | ICD-10-CM | POA: Diagnosis not present

## 2022-07-18 DIAGNOSIS — Z794 Long term (current) use of insulin: Secondary | ICD-10-CM | POA: Diagnosis not present

## 2022-07-18 DIAGNOSIS — D649 Anemia, unspecified: Secondary | ICD-10-CM | POA: Insufficient documentation

## 2022-07-18 DIAGNOSIS — Z01818 Encounter for other preprocedural examination: Secondary | ICD-10-CM | POA: Insufficient documentation

## 2022-07-18 HISTORY — DX: Anemia, unspecified: D64.9

## 2022-07-18 HISTORY — DX: Dyspnea, unspecified: R06.00

## 2022-07-18 HISTORY — DX: Depression, unspecified: F32.A

## 2022-07-18 HISTORY — DX: Pneumonia, unspecified organism: J18.9

## 2022-07-18 HISTORY — DX: Hypothyroidism, unspecified: E03.9

## 2022-07-18 HISTORY — DX: Unspecified osteoarthritis, unspecified site: M19.90

## 2022-07-18 LAB — BASIC METABOLIC PANEL
Anion gap: 10 (ref 5–15)
BUN: 12 mg/dL (ref 6–20)
CO2: 26 mmol/L (ref 22–32)
Calcium: 9.3 mg/dL (ref 8.9–10.3)
Chloride: 108 mmol/L (ref 98–111)
Creatinine, Ser: 0.83 mg/dL (ref 0.44–1.00)
GFR, Estimated: >60 mL/min (ref >60)
Glucose, Bld: 124 mg/dL — ABNORMAL HIGH (ref 70–99)
Potassium: 4.1 mmol/L (ref 3.5–5.1)
Sodium: 144 mmol/L (ref 135–145)

## 2022-07-18 LAB — CBC
HCT: 41.1 % (ref 36.0–46.0)
Hemoglobin: 13.1 g/dL (ref 12.0–15.0)
MCH: 26.7 pg (ref 26.0–34.0)
MCHC: 31.9 g/dL (ref 30.0–36.0)
MCV: 83.7 fL (ref 80.0–100.0)
Platelets: 406 10*3/uL — ABNORMAL HIGH (ref 150–400)
RBC: 4.91 MIL/uL (ref 3.87–5.11)
RDW: 13.4 % (ref 11.5–15.5)
WBC: 7.9 10*3/uL (ref 4.0–10.5)
nRBC: 0 % (ref 0.0–0.2)

## 2022-07-18 LAB — GLUCOSE, CAPILLARY: Glucose-Capillary: 132 mg/dL — ABNORMAL HIGH (ref 70–99)

## 2022-07-19 ENCOUNTER — Other Ambulatory Visit (HOSPITAL_COMMUNITY): Payer: Self-pay

## 2022-07-25 ENCOUNTER — Ambulatory Visit (HOSPITAL_COMMUNITY): Payer: No Typology Code available for payment source | Admitting: Physician Assistant

## 2022-07-25 ENCOUNTER — Other Ambulatory Visit (HOSPITAL_COMMUNITY): Payer: Self-pay

## 2022-07-25 ENCOUNTER — Encounter (HOSPITAL_COMMUNITY)
Admission: RE | Disposition: A | Discharge status: Home / Self Care | Payer: Self-pay | Source: Ambulatory Visit | Attending: Specialist

## 2022-07-25 ENCOUNTER — Ambulatory Visit (HOSPITAL_COMMUNITY)
Admission: RE | Admit: 2022-07-25 | Discharge: 2022-07-25 | Disposition: A | Discharge status: Home / Self Care | Payer: No Typology Code available for payment source | Source: Ambulatory Visit | Attending: Specialist | Admitting: Specialist

## 2022-07-25 ENCOUNTER — Other Ambulatory Visit: Payer: Self-pay

## 2022-07-25 ENCOUNTER — Encounter (HOSPITAL_COMMUNITY): Payer: Self-pay | Admitting: Specialist

## 2022-07-25 ENCOUNTER — Ambulatory Visit (HOSPITAL_BASED_OUTPATIENT_CLINIC_OR_DEPARTMENT_OTHER): Payer: No Typology Code available for payment source | Admitting: Certified Registered Nurse Anesthetist

## 2022-07-25 DIAGNOSIS — Z794 Long term (current) use of insulin: Secondary | ICD-10-CM | POA: Insufficient documentation

## 2022-07-25 DIAGNOSIS — E119 Type 2 diabetes mellitus without complications: Secondary | ICD-10-CM | POA: Insufficient documentation

## 2022-07-25 DIAGNOSIS — M75101 Unspecified rotator cuff tear or rupture of right shoulder, not specified as traumatic: Secondary | ICD-10-CM

## 2022-07-25 DIAGNOSIS — M75122 Complete rotator cuff tear or rupture of left shoulder, not specified as traumatic: Secondary | ICD-10-CM | POA: Diagnosis present

## 2022-07-25 DIAGNOSIS — E039 Hypothyroidism, unspecified: Secondary | ICD-10-CM | POA: Diagnosis not present

## 2022-07-25 HISTORY — PX: SHOULDER ARTHROSCOPY WITH ROTATOR CUFF REPAIR AND SUBACROMIAL DECOMPRESSION: SHX5686

## 2022-07-25 LAB — GLUCOSE, CAPILLARY: Glucose-Capillary: 126 mg/dL — ABNORMAL HIGH (ref 70–99)

## 2022-07-25 SURGERY — SHOULDER ARTHROSCOPY WITH ROTATOR CUFF REPAIR AND SUBACROMIAL DECOMPRESSION
Anesthesia: General | Site: Shoulder | Laterality: Right

## 2022-07-25 MED ORDER — SUGAMMADEX SODIUM 200 MG/2ML IV SOLN
INTRAVENOUS | Status: DC | PRN
  Administered 2022-07-25: 200 mg via INTRAVENOUS

## 2022-07-25 MED ORDER — BUPIVACAINE LIPOSOME 1.3 % IJ SUSP
INTRAMUSCULAR | Status: DC | PRN
  Administered 2022-07-25: 10 mL via PERINEURAL

## 2022-07-25 MED ORDER — PHENYLEPHRINE HCL-NACL 20-0.9 MG/250ML-% IV SOLN
INTRAVENOUS | Status: DC | PRN
  Administered 2022-07-25: 50 ug/min via INTRAVENOUS

## 2022-07-25 MED ORDER — ORAL CARE MOUTH RINSE: 15.0000 mL | Freq: Once | OROMUCOSAL | Status: AC

## 2022-07-25 MED ORDER — ROCURONIUM BROMIDE 10 MG/ML (PF) SYRINGE
PREFILLED_SYRINGE | INTRAVENOUS | Status: DC | PRN
  Administered 2022-07-25: 50 mg via INTRAVENOUS

## 2022-07-25 MED ORDER — OXYCODONE HCL 5 MG PO TABS
5.0000 mg | ORAL_TABLET | ORAL | 0 refills | Status: DC | PRN
  Filled 2022-07-25: qty 40, 7d supply, fill #0

## 2022-07-25 MED ORDER — POLYETHYLENE GLYCOL 3350 17 G PO PACK
17.0000 g | PACK | Freq: Every day | ORAL | 0 refills | Status: DC
  Filled 2022-07-25: qty 14, 14d supply, fill #0

## 2022-07-25 MED ORDER — AMISULPRIDE (ANTIEMETIC) 5 MG/2ML IV SOLN
INTRAVENOUS | Status: AC
  Filled 2022-07-25: qty 4

## 2022-07-25 MED ORDER — FENTANYL CITRATE PF 50 MCG/ML IJ SOSY: 25.0000 ug | PREFILLED_SYRINGE | INTRAMUSCULAR | Status: DC | PRN

## 2022-07-25 MED ORDER — ESMOLOL HCL 100 MG/10ML IV SOLN
INTRAVENOUS | Status: DC | PRN
  Administered 2022-07-25 (×2): 20 mg via INTRAVENOUS

## 2022-07-25 MED ORDER — SODIUM CHLORIDE 0.9 % IR SOLN
Status: DC | PRN
  Administered 2022-07-25: 1800 mL

## 2022-07-25 MED ORDER — BUPIVACAINE-EPINEPHRINE (PF) 0.25% -1:200000 IJ SOLN
INTRAMUSCULAR | Status: DC | PRN
  Administered 2022-07-25: 15 mL via PERINEURAL

## 2022-07-25 MED ORDER — CEPHALEXIN 500 MG PO CAPS
500.0000 mg | ORAL_CAPSULE | Freq: Four times a day (QID) | ORAL | 1 refills | Status: DC
  Filled 2022-07-25: qty 6, 2d supply, fill #0

## 2022-07-25 MED ORDER — ASPIRIN 81 MG PO TBEC
81.0000 mg | DELAYED_RELEASE_TABLET | Freq: Every day | ORAL | 1 refills | Status: DC
  Filled 2022-07-25: qty 30, 30d supply, fill #0

## 2022-07-25 MED ORDER — LACTATED RINGERS IV SOLN: INTRAVENOUS | Status: DC

## 2022-07-25 MED ORDER — CEFAZOLIN SODIUM-DEXTROSE 2-4 GM/100ML-% IV SOLN
2.0000 g | INTRAVENOUS | Status: AC
  Administered 2022-07-25: 2 g via INTRAVENOUS
  Filled 2022-07-25: qty 100

## 2022-07-25 MED ORDER — LIDOCAINE 1 % OPTIME INJ - NO CHARGE
INTRAMUSCULAR | Status: DC | PRN
  Administered 2022-07-25: 2 mL

## 2022-07-25 MED ORDER — MIDAZOLAM HCL 2 MG/2ML IJ SOLN
INTRAMUSCULAR | Status: AC
  Filled 2022-07-25: qty 2

## 2022-07-25 MED ORDER — TRANEXAMIC ACID 1000 MG/10ML IV SOLN
2000.0000 mg | Freq: Once | INTRAVENOUS | Status: AC
  Administered 2022-07-25: 2000 mg via TOPICAL
  Filled 2022-07-25: qty 20

## 2022-07-25 MED ORDER — ONDANSETRON HCL 4 MG/2ML IJ SOLN
INTRAMUSCULAR | Status: DC | PRN
  Administered 2022-07-25: 4 mg via INTRAVENOUS

## 2022-07-25 MED ORDER — FENTANYL CITRATE (PF) 100 MCG/2ML IJ SOLN
INTRAMUSCULAR | Status: AC
  Filled 2022-07-25: qty 2

## 2022-07-25 MED ORDER — DEXAMETHASONE SODIUM PHOSPHATE 10 MG/ML IJ SOLN
INTRAMUSCULAR | Status: DC | PRN
  Administered 2022-07-25: 5 mg via INTRAVENOUS

## 2022-07-25 MED ORDER — SODIUM CHLORIDE 0.9 % IR SOLN
Status: DC | PRN
  Administered 2022-07-25: 1000 mL

## 2022-07-25 MED ORDER — EPINEPHRINE PF 1 MG/ML IJ SOLN
INTRAMUSCULAR | Status: AC
  Filled 2022-07-25: qty 1

## 2022-07-25 MED ORDER — METHOCARBAMOL 750 MG PO TABS
750.0000 mg | ORAL_TABLET | Freq: Three times a day (TID) | ORAL | 1 refills | Status: DC | PRN
  Filled 2022-07-25: qty 30, 10d supply, fill #0

## 2022-07-25 MED ORDER — AMISULPRIDE (ANTIEMETIC) 5 MG/2ML IV SOLN
10.0000 mg | Freq: Once | INTRAVENOUS | Status: AC | PRN
  Administered 2022-07-25: 10 mg via INTRAVENOUS

## 2022-07-25 MED ORDER — DEXAMETHASONE SODIUM PHOSPHATE 10 MG/ML IJ SOLN
INTRAMUSCULAR | Status: AC
  Filled 2022-07-25: qty 1

## 2022-07-25 MED ORDER — TRANEXAMIC ACID-NACL 1000-0.7 MG/100ML-% IV SOLN
1000.0000 mg | INTRAVENOUS | Status: AC
  Administered 2022-07-25: 1000 mg via INTRAVENOUS
  Filled 2022-07-25: qty 100

## 2022-07-25 MED ORDER — METHOCARBAMOL 500 MG PO TABS: 500.0000 mg | ORAL_TABLET | Freq: Three times a day (TID) | ORAL | Status: AC

## 2022-07-25 MED ORDER — FENTANYL CITRATE (PF) 100 MCG/2ML IJ SOLN
INTRAMUSCULAR | Status: DC | PRN
  Administered 2022-07-25: 50 ug via INTRAVENOUS
  Administered 2022-07-25 (×2): 25 ug via INTRAVENOUS

## 2022-07-25 MED ORDER — MIDAZOLAM HCL 5 MG/5ML IJ SOLN
INTRAMUSCULAR | Status: DC | PRN
  Administered 2022-07-25: 1 mg via INTRAVENOUS

## 2022-07-25 MED ORDER — FENTANYL CITRATE PF 50 MCG/ML IJ SOSY
50.0000 ug | PREFILLED_SYRINGE | INTRAMUSCULAR | Status: AC
  Administered 2022-07-25: 50 ug via INTRAVENOUS
  Filled 2022-07-25: qty 2

## 2022-07-25 MED ORDER — CHLORHEXIDINE GLUCONATE 0.12 % MT SOLN
15.0000 mL | Freq: Once | OROMUCOSAL | Status: AC
  Administered 2022-07-25: 15 mL via OROMUCOSAL

## 2022-07-25 MED ORDER — BUPIVACAINE-EPINEPHRINE 0.5% -1:200000 IJ SOLN
INTRAMUSCULAR | Status: DC | PRN
  Administered 2022-07-25: 20 mL

## 2022-07-25 MED ORDER — BUPIVACAINE-EPINEPHRINE (PF) 0.5% -1:200000 IJ SOLN
INTRAMUSCULAR | Status: AC
  Filled 2022-07-25: qty 30

## 2022-07-25 MED ORDER — PROPOFOL 10 MG/ML IV BOLUS
INTRAVENOUS | Status: AC
  Filled 2022-07-25: qty 20

## 2022-07-25 MED ORDER — BUPIVACAINE HCL (PF) 0.25 % IJ SOLN
INTRAMUSCULAR | Status: AC
  Filled 2022-07-25: qty 20

## 2022-07-25 MED ORDER — PHENYLEPHRINE 80 MCG/ML (10ML) SYRINGE FOR IV PUSH (FOR BLOOD PRESSURE SUPPORT)
PREFILLED_SYRINGE | INTRAVENOUS | Status: DC | PRN
  Administered 2022-07-25: 160 ug via INTRAVENOUS
  Administered 2022-07-25: 80 ug via INTRAVENOUS
  Administered 2022-07-25: 160 ug via INTRAVENOUS

## 2022-07-25 MED ORDER — ONDANSETRON HCL 4 MG/2ML IJ SOLN
INTRAMUSCULAR | Status: AC
  Filled 2022-07-25: qty 2

## 2022-07-25 MED ORDER — LIDOCAINE HCL (PF) 1 % IJ SOLN
INTRAMUSCULAR | Status: AC
  Filled 2022-07-25: qty 30

## 2022-07-25 MED ORDER — ACETAMINOPHEN 500 MG PO TABS
1000.0000 mg | ORAL_TABLET | Freq: Once | ORAL | Status: AC
  Administered 2022-07-25: 1000 mg via ORAL
  Filled 2022-07-25: qty 2

## 2022-07-25 MED ORDER — PROPOFOL 10 MG/ML IV BOLUS
INTRAVENOUS | Status: DC | PRN
  Administered 2022-07-25: 150 mg via INTRAVENOUS
  Administered 2022-07-25: 50 mg via INTRAVENOUS

## 2022-07-25 MED ORDER — MIDAZOLAM HCL 2 MG/2ML IJ SOLN
1.0000 mg | INTRAMUSCULAR | Status: AC
  Administered 2022-07-25: 1 mg via INTRAVENOUS
  Filled 2022-07-25: qty 2

## 2022-07-25 MED ORDER — DOCUSATE SODIUM 100 MG PO CAPS
100.0000 mg | ORAL_CAPSULE | Freq: Two times a day (BID) | ORAL | 1 refills | Status: DC | PRN
  Filled 2022-07-25: qty 30, 15d supply, fill #0

## 2022-07-25 SURGICAL SUPPLY — 60 items
ANCH SUT SWLK 19.1X4.75 (Anchor) ×4 IMPLANT
ANCHOR NDL 9/16 CIR SZ 8 (NEEDLE) IMPLANT
ANCHOR NEEDLE 9/16 CIR SZ 8 (NEEDLE) IMPLANT
ANCHOR SUT BIO SW 4.75X19.1 (Anchor) IMPLANT
BAG COUNTER SPONGE SURGICOUNT (BAG) IMPLANT
BAG SPNG CNTER NS LX DISP (BAG) ×1
BLADE OSCILLATING/SAGITTAL (BLADE) ×1
BLADE SHAVER TORPEDO 4X13 (MISCELLANEOUS) IMPLANT
BLADE SURG SZ11 CARB STEEL (BLADE) ×1 IMPLANT
BLADE SW THK.38XMED LNG THN (BLADE) ×1 IMPLANT
CANNULA ACUFO 5X76 (CANNULA) ×1 IMPLANT
CLSR STERI-STRIP ANTIMIC 1/2X4 (GAUZE/BANDAGES/DRESSINGS) IMPLANT
COVER SURGICAL LIGHT HANDLE (MISCELLANEOUS) ×1 IMPLANT
DRAPE ORTHO SPLIT 77X108 STRL (DRAPES)
DRAPE POUCH INSTRU U-SHP 10X18 (DRAPES) ×1 IMPLANT
DRAPE STERI 35X30 U-POUCH (DRAPES) ×1 IMPLANT
DRAPE SURG ORHT 6 SPLT 77X108 (DRAPES) IMPLANT
DRSG AQUACEL AG ADV 3.5X 4 (GAUZE/BANDAGES/DRESSINGS) IMPLANT
DRSG AQUACEL AG ADV 3.5X 6 (GAUZE/BANDAGES/DRESSINGS) IMPLANT
DURAPREP 26ML APPLICATOR (WOUND CARE) ×1 IMPLANT
DW OUTFLOW CASSETTE/TUBE SET (MISCELLANEOUS) ×1 IMPLANT
ELECT NDL TIP 2.8 STRL (NEEDLE) ×1 IMPLANT
ELECT NEEDLE TIP 2.8 STRL (NEEDLE) ×1 IMPLANT
ELECT REM PT RETURN 15FT ADLT (MISCELLANEOUS) ×1 IMPLANT
GAUZE PAD ABD 8X10 STRL (GAUZE/BANDAGES/DRESSINGS) IMPLANT
GLOVE BIOGEL PI IND STRL 7.0 (GLOVE) ×1 IMPLANT
GLOVE SURG POLYISO LF SZ7.5 (GLOVE) ×2 IMPLANT
GLOVE SURG SS PI 8.0 STRL IVOR (GLOVE) ×2 IMPLANT
GOWN STRL REUS W/ TWL XL LVL3 (GOWN DISPOSABLE) ×2 IMPLANT
GOWN STRL REUS W/TWL XL LVL3 (GOWN DISPOSABLE) ×2
KIT BASIN OR (CUSTOM PROCEDURE TRAY) ×1 IMPLANT
KIT POSITION SHOULDER SCHLEI (MISCELLANEOUS) ×1 IMPLANT
KIT TURNOVER KIT A (KITS) IMPLANT
MANIFOLD NEPTUNE II (INSTRUMENTS) ×1 IMPLANT
NDL SCORPION MULTI FIRE (NEEDLE) IMPLANT
NDL SPNL 18GX3.5 QUINCKE PK (NEEDLE) ×1 IMPLANT
NEEDLE SCORPION MULTI FIRE (NEEDLE) ×1 IMPLANT
NEEDLE SPNL 18GX3.5 QUINCKE PK (NEEDLE) ×1 IMPLANT
PACK SHOULDER (CUSTOM PROCEDURE TRAY) ×1 IMPLANT
PORT APPOLLO RF 90DEGREE MULTI (SURGICAL WAND) IMPLANT
PROTECTOR NERVE ULNAR (MISCELLANEOUS) ×1 IMPLANT
SLING ARM IMMOBILIZER LRG (SOFTGOODS) IMPLANT
SLING ARM IMMOBILIZER MED (SOFTGOODS) IMPLANT
STRIP CLOSURE SKIN 1/2X4 (GAUZE/BANDAGES/DRESSINGS) IMPLANT
SUCTION FRAZIER HANDLE 10FR (MISCELLANEOUS) ×1
SUCTION TUBE FRAZIER 10FR DISP (MISCELLANEOUS) ×1 IMPLANT
SUT ETHIBOND NAB CT1 #1 30IN (SUTURE) IMPLANT
SUT ETHILON 4 0 PS 2 18 (SUTURE) ×1 IMPLANT
SUT FIBERWIRE #2 38 T-5 BLUE (SUTURE)
SUT PROLENE 3 0 PS 2 (SUTURE) IMPLANT
SUT TIGER TAPE 7 IN WHITE (SUTURE) IMPLANT
SUT VIC AB 1-0 CT2 27 (SUTURE) IMPLANT
SUT VIC AB 2-0 CT2 27 (SUTURE) IMPLANT
SUTURE FIBERWR #2 38 T-5 BLUE (SUTURE) IMPLANT
TAPE FIBER 2MM 7IN #2 BLUE (SUTURE) IMPLANT
TOWEL OR 17X26 10 PK STRL BLUE (TOWEL DISPOSABLE) ×1 IMPLANT
TOWEL OR NON WOVEN STRL DISP B (DISPOSABLE) IMPLANT
TUBING ARTHROSCOPY IRRIG 16FT (MISCELLANEOUS) ×1 IMPLANT
TUBING CONNECTING 10 (TUBING) ×1 IMPLANT
WIPE CHG 2% 2PK PREOPERATIVE (MISCELLANEOUS) ×1 IMPLANT

## 2022-07-25 NOTE — Anesthesia Procedure Notes (Signed)
Procedure Name: Intubation Date/Time: 07/25/2022 12:03 PM  Performed by: West Pugh, CRNAPre-anesthesia Checklist: Patient identified, Emergency Drugs available, Suction available, Patient being monitored and Timeout performed Patient Re-evaluated:Patient Re-evaluated prior to induction Oxygen Delivery Method: Circle system utilized Preoxygenation: Pre-oxygenation with 100% oxygen Induction Type: IV induction Ventilation: Mask ventilation without difficulty Laryngoscope Size: Mac and 4 Grade View: Grade I Tube type: Oral Tube size: 7.0 mm Number of attempts: 1 Airway Equipment and Method: Stylet Placement Confirmation: ETT inserted through vocal cords under direct vision, positive ETCO2, CO2 detector and breath sounds checked- equal and bilateral Secured at: 22 cm Tube secured with: Tape Dental Injury: Teeth and Oropharynx as per pre-operative assessment

## 2022-07-25 NOTE — Discharge Instructions (Addendum)

## 2022-07-25 NOTE — Op Note (Addendum)
NAME: Samantha [name], Samantha [name] MEDICAL RECORD NO: [id number] ACCOUNT NO: [id number] DATE OF BIRTH: [birth year] FACILITY: [facility] LOCATION: [facility] PHYSICIAN: Johnn Hai, MD  Operative Report   DATE OF PROCEDURE: 07/25/2022  PREOPERATIVE DIAGNOSES:  Rotator cuff tear, right shoulder.  POSTOPERATIVE DIAGNOSES:  Rotator cuff tear, right shoulder.  PROCEDURE PERFORMED:  Right shoulder arthroscopy with subacromial decompression, release of CA ligament.  Subacromial and subdeltoid bursectomy, mini open rotator cuff repair.  ANESTHESIA:  General.  ASSISTANT:  Lacie Draft, PA.  HISTORY:  This is a 56 year old female with a high grade rotator cuff tear, supraspinatus portion of the infraspinatus.  Subacromial bursitis, mild degeneration of the glenohumeral joint.  Some tearing of the subscap, mild AC arthrosis.  She had  progressive tearing of the rotator cuff and pain.  She was indicated for repair of the rotator cuff.  Risks and benefits discussed including bleeding, infection, damage to neurovascular structures, no change in symptoms, worsening symptoms, DVT, PE,  anesthetic complications, etc.  TECHNIQUE:  With the patient in supine beach chair position, after induction of adequate general anesthesia, 2 grams Kefzol right shoulder and upper extremity was prepped and draped in the usual sterile fashion.  The patient had good range of motion of  the shoulder.  A surgical marker utilized to delineate the acromion, AC joint and coracoid.  A standard posterolateral portal was utilized with incision through the skin only as well as an anterolateral portal.  We first entered the subacromial space.   Irrigant was utilized to insufflate the joint.  Inspection revealed hypertrophic bursa noted throughout.  Introduced a shaver and performed a full subdeltoid and, subacromial bursectomy.  Some thickening of the CA ligament.  This was released with an  ArthroWand.  The patient had significant fraying and  essentially a full thickness tear of the supraspinatus and infraspinatus tendon approximately a 1 cm from its insertion.  The rotator cuff end was debrided with a shaver.  We felt this was amenable to a mini open  rotator cuff repair.  Prior to that, we redirected the camera towards into the glenohumeral joint in line with the coracoid.  With multiple configurations of the arm with relaxation and traction in the 70/30 position there was a fair amount of tightness  in the capsule posteriorly precluded a traumatic injury of the glenohumeral joint.  We therefore felt given the initial findings that we would forego glenohumeral evaluation.  I removed all instrumentation.  I closed the portals with 4-0 nylon simple  sutures.  I made a 2 and 1/2 cm incision over the anterolateral aspect of the acromion.  Subcutaneous tissue was dissected.  Electrocautery was utilized to achieve hemostasis.  The raphae between the anterolateral heads of the deltoid was divided in line  with skin incision.  A self-retaining retractor was then placed.  I entered the subacromial space.  A 3 mm Kerrison was utilized to remove a small spur off the anterolateral aspect of the acromion.  Full thickness tear was noted with severe degeneration  of the tendon. Lateral to that I used a 15 blade to complete the tears with an elliptical incision approximately 2 x 2 x 1.5 cm.  Good bleeding tissue.  I then used an AO elevator and a Beyer rongeur to decorticate and prepare a trough lateral to the  articular surface to the greater tuberosity with Melrose Nakayama rongeur.  I mobilized the cuff on its bursal and articular surface with an Allis and a Cobb.  It advanced without difficulty.  Following this, I placed a SwiveLock anchor just lateral to the  articular surface after fashioning a pilot hole with an awl and then inserting the SwiveLock with two tiger tapes attached to that.  After insertion of the SwiveLock with excellent resistance to pullout.  These  were then both threaded through the  supraspinatus, infraspinatus with Scorpion suture passer, posterior and posteromedially as well as the anterior and anteromedial.  These were then crossed and the posterior grouping was then secured over the lateral aspect of the greater tuberosity in a  double row fashion with a second SwiveLock after fashioning a hole with a pilot hole with an awl.  With the arm at its side advancing the tendon and inserting the SwiveLock into the pilot hole.  Inserting a fully with excellent resistance to pullout.   Redundant suture removed.  In a similar fashion, we used a pilot hole laterally for the anterior grouping, fashioned a pilot hole with an awl, I inserted the SwiveLock though there was not good purchase to pullout and the SwiveLock pulled out.  I felt  the bone quality here was suboptimal.  I had redirected and refashioned an additional pilot hole more lateral and more straight lateral.  I then used an additional SwiveLock, inserted the suture in the SwiveLock, advanced the tendon and inserted into  this second SwiveLock and inserted excellent resistance to pullout.  Redundant suture removed.  Inspection revealed full coverage of the humeral head in a watertight closure.  Copiously irrigated.  0.25% Marcaine with epinephrine was infiltrated into the  deltoid and other side of the raphae and into the joint.  Copiously irrigated.  Electrocautery was utilized to achieve hemostasis.  I digitally lysed any adhesions.  Copiously irrigated once again, closed the raphae with #1 Vicryl in interrupted  figure-of-eight sutures, subcutaneous with 2-0 and skin with Prolene.  Sterile dressing applied, placed in a sling, extubated, and transported to the recovery room in satisfactory condition.  The patient tolerated the procedure well.  No complications.  ASSISTANT:  Lacie Draft, PA.  BLOOD LOSS:  Minimal.   PUS D: 07/25/2022 2:00:34 pm T: 07/25/2022 6:24:00 pm  JOB:  [id number]/ [id number]

## 2022-07-25 NOTE — Anesthesia Procedure Notes (Signed)
Anesthesia Regional Block: Interscalene brachial plexus block   Pre-Anesthetic Checklist: , timeout performed,  Correct Patient, Correct Site, Correct Laterality,  Correct Procedure, Correct Position, site marked,  Risks and benefits discussed,  Surgical consent,  Pre-op evaluation,  At surgeon's request and post-op pain management  Laterality: Right  Prep: chloraprep       Needles:  Injection technique: Single-shot  Needle Type: Echogenic Needle     Needle Length: 9cm  Needle Gauge: 21     Additional Needles:   Procedures:, nerve stimulator,,, ultrasound used (permanent image in chart),,     Nerve Stimulator or Paresthesia:  Response: deltoid, 0.6 mA  Additional Responses:   Narrative:  Start time: 07/25/2022 11:06 AM End time: 07/25/2022 11:13 AM Injection made incrementally with aspirations every 5 mL.  Events: blood aspirated,  Other event  Performed by: Personally  Anesthesiologist: Suzette Battiest, MD

## 2022-07-25 NOTE — Anesthesia Preprocedure Evaluation (Signed)
Anesthesia Evaluation  Patient identified by MRN, date of birth, ID band Patient awake    Reviewed: Allergy & Precautions, NPO status , Patient's Chart, lab work & pertinent test results  Airway Mallampati: II  TM Distance: >3 FB Neck ROM: Full    Dental  (+) Dental Advisory Given   Pulmonary neg pulmonary ROS   breath sounds clear to auscultation       Cardiovascular negative cardio ROS  Rhythm:Regular Rate:Normal     Neuro/Psych negative neurological ROS     GI/Hepatic negative GI ROS, Neg liver ROS,,,  Endo/Other  diabetes, Type 2, Insulin DependentHypothyroidism    Renal/GU negative Renal ROS     Musculoskeletal  (+) Arthritis ,    Abdominal   Peds  Hematology negative hematology ROS (+)   Anesthesia Other Findings   Reproductive/Obstetrics                              Lab Results  Component Value Date   WBC 7.9 07/18/2022   HGB 13.1 07/18/2022   HCT 41.1 07/18/2022   MCV 83.7 07/18/2022   PLT 406 (H) 07/18/2022   Lab Results  Component Value Date   CREATININE 0.83 07/18/2022   BUN 12 07/18/2022   NA 144 07/18/2022   K 4.1 07/18/2022   CL 108 07/18/2022   CO2 26 07/18/2022    Anesthesia Physical Anesthesia Plan  ASA: 2  Anesthesia Plan: General   Post-op Pain Management: Regional block* and Tylenol PO (pre-op)*   Induction: Intravenous  PONV Risk Score and Plan: 3 and Dexamethasone, Ondansetron and Treatment may vary due to age or medical condition  Airway Management Planned: Oral ETT  Additional Equipment:   Intra-op Plan:   Post-operative Plan: Extubation in OR  Informed Consent: I have reviewed the patients History and Physical, chart, labs and discussed the procedure including the risks, benefits and alternatives for the proposed anesthesia with the patient or authorized representative who has indicated his/her understanding and acceptance.      Dental advisory given  Plan Discussed with: CRNA  Anesthesia Plan Comments:          Anesthesia Quick Evaluation

## 2022-07-25 NOTE — Interval H&P Note (Signed)
History and Physical Interval Note:  07/25/2022 11:30 AM  Samantha [name]  has presented today for surgery, with the diagnosis of Right shoulder rotator cuff tear.  The various methods of treatment have been discussed with the patient and family. After consideration of risks, benefits and other options for treatment, the patient has consented to  Procedure(s): Right shoulder arthroscopy, subacromial decompression, debridement, mini open rotator cuff repair (Right) as a surgical intervention.  The patient's history has been reviewed, patient examined, no change in status, stable for surgery.  I have reviewed the patient's chart and labs.  Questions were answered to the patient's satisfaction.     Johnn Hai

## 2022-07-25 NOTE — Brief Op Note (Addendum)
07/25/2022  1:51 PM  PATIENT:  Samantha [name]  56 y.o. female  PRE-OPERATIVE DIAGNOSIS:  Right shoulder rotator cuff tear  POST-OPERATIVE DIAGNOSIS:  Right shoulder rotator cuff tear  PROCEDURE:  Procedure(s): Right shoulder arthroscopy, subacromial decompression, debridement, mini open rotator cuff repair (Right) DIAGNOSES: Right shoulder, acute on chronic rotator cuff tear.  POST-OPERATIVE DIAGNOSIS: same  PROCEDURE: Open repair acute rotator cuff tear - 23410 Arthroscopic limited debridement - 97989 Arthroscopic subacromial decompression - 21194   OPERATIVE FINDING: Exam under anesthesia: Normal Articular space: Normal Chondral surfaces: Normal Biceps:  Subscapularis: Incomplete tear 10% Supraspinatus: Incomplete tear 85% Infraspinatus: Incomplete tear 85%   SURGEON:  Surgeon(s) and Role:    Susa Day, MD - Primary  PHYSICIAN ASSISTANT:   ASSISTANTS: Bissell   ANESTHESIA:   general  EBL:  20    BLOOD ADMINISTERED:none  DRAINS: none   LOCAL MEDICATIONS USED:  MARCAINE     SPECIMEN:  No Specimen  DISPOSITION OF SPECIMEN:  N/A  COUNTS:  YES  TOURNIQUET:  * No tourniquets in log *  DICTATION: .Other Dictation: Dictation Number [id number]  PLAN OF CARE: Discharge to home after PACU  PATIENT DISPOSITION:  PACU - hemodynamically stable.   Delay start of Pharmacological VTE agent (>24hrs) due to surgical blood loss or risk of bleeding: no

## 2022-07-25 NOTE — Transfer of Care (Signed)
Immediate Anesthesia Transfer of Care Note  Patient: Samantha [name]  Procedure(s) Performed: Right shoulder arthroscopy, subacromial decompression, debridement, mini open rotator cuff repair (Right: Shoulder)  Patient Location: PACU  Anesthesia Type:GA combined with regional for post-op pain  Level of Consciousness: awake and patient cooperative  Airway & Oxygen Therapy: Patient Spontanous Breathing and Patient connected to face mask oxygen  Post-op Assessment: Report given to RN and Post -op Vital signs reviewed and stable  Post vital signs: Reviewed and stable  Last Vitals:  Vitals Value Taken Time  BP 152/95 07/25/22 1430  Temp    Pulse 97 07/25/22 1433  Resp 16 07/25/22 1433  SpO2 100 % 07/25/22 1433  Vitals shown include unvalidated device data.  Last Pain:  Vitals:   07/25/22 1125  TempSrc:   PainSc: 0-No pain         Complications: No notable events documented.

## 2022-07-26 ENCOUNTER — Other Ambulatory Visit (HOSPITAL_COMMUNITY): Payer: Self-pay

## 2022-07-26 ENCOUNTER — Encounter (HOSPITAL_COMMUNITY): Payer: Self-pay | Admitting: Specialist

## 2022-07-26 NOTE — Anesthesia Postprocedure Evaluation (Signed)
Anesthesia Post Note  Patient: Samantha [name]  Procedure(s) Performed: Right shoulder arthroscopy, subacromial decompression, debridement, mini open rotator cuff repair (Right: Shoulder)     Patient location during evaluation: PACU Anesthesia Type: General Level of consciousness: awake and alert Pain management: pain level controlled Vital Signs Assessment: post-procedure vital signs reviewed and stable Respiratory status: spontaneous breathing, nonlabored ventilation, respiratory function stable and patient connected to nasal cannula oxygen Cardiovascular status: blood pressure returned to baseline and stable Postop Assessment: no apparent nausea or vomiting Anesthetic complications: no   No notable events documented.  Last Vitals:  Vitals:   07/25/22 1430 07/25/22 1600  BP: (!) 152/95 (!) 140/90  Pulse: 100 92  Resp: 18   Temp: 37.1 C   SpO2: 100% 93%    Last Pain:  Vitals:   07/25/22 1528  TempSrc:   PainSc: 0-No pain                 Tiajuana Amass

## 2022-07-30 ENCOUNTER — Other Ambulatory Visit: Payer: Self-pay | Admitting: Internal Medicine

## 2022-07-30 ENCOUNTER — Other Ambulatory Visit (HOSPITAL_COMMUNITY): Payer: Self-pay

## 2022-07-30 MED ORDER — INSULIN GLARGINE-YFGN 100 UNIT/ML ~~LOC~~ SOPN
40.0000 [IU] | PEN_INJECTOR | Freq: Two times a day (BID) | SUBCUTANEOUS | 0 refills | Status: DC
  Filled 2022-07-30: qty 30, 38d supply, fill #0

## 2022-08-01 ENCOUNTER — Other Ambulatory Visit (HOSPITAL_COMMUNITY): Payer: Self-pay

## 2022-08-12 ENCOUNTER — Other Ambulatory Visit (HOSPITAL_COMMUNITY): Payer: Self-pay

## 2022-08-13 ENCOUNTER — Other Ambulatory Visit (HOSPITAL_COMMUNITY): Payer: Self-pay

## 2022-08-13 MED ORDER — OXYCODONE HCL 5 MG PO TABS
5.0000 mg | ORAL_TABLET | Freq: Four times a day (QID) | ORAL | 0 refills | Status: DC | PRN
  Filled 2022-08-13: qty 40, 10d supply, fill #0

## 2022-08-27 ENCOUNTER — Other Ambulatory Visit: Payer: Self-pay

## 2022-08-27 ENCOUNTER — Other Ambulatory Visit (HOSPITAL_COMMUNITY): Payer: Self-pay

## 2022-08-27 ENCOUNTER — Other Ambulatory Visit: Payer: Self-pay | Admitting: Internal Medicine

## 2022-08-28 ENCOUNTER — Other Ambulatory Visit (HOSPITAL_COMMUNITY): Payer: Self-pay

## 2022-08-28 MED ORDER — OZEMPIC (1 MG/DOSE) 4 MG/3ML ~~LOC~~ SOPN
1.0000 mg | PEN_INJECTOR | SUBCUTANEOUS | 3 refills | Status: DC
  Filled 2022-08-28: qty 3, 28d supply, fill #0
  Filled 2022-10-01: qty 3, 28d supply, fill #1
  Filled 2022-10-27: qty 3, 28d supply, fill #2
  Filled 2022-11-22: qty 3, 28d supply, fill #3
  Filled 2022-12-29 – 2023-01-01 (×4): qty 3, 28d supply, fill #4
  Filled 2023-01-27: qty 3, 28d supply, fill #5
  Filled 2023-02-21: qty 3, 28d supply, fill #6
  Filled 2023-03-26: qty 3, 28d supply, fill #7
  Filled 2023-04-19: qty 3, 28d supply, fill #8
  Filled 2023-05-22 – 2023-05-24 (×2): qty 3, 28d supply, fill #9
  Filled 2023-06-26: qty 3, 28d supply, fill #10
  Filled 2023-08-01: qty 3, 28d supply, fill #11

## 2022-08-29 ENCOUNTER — Other Ambulatory Visit: Payer: Self-pay

## 2022-09-10 ENCOUNTER — Other Ambulatory Visit (HOSPITAL_COMMUNITY): Payer: Self-pay

## 2022-09-10 ENCOUNTER — Other Ambulatory Visit: Payer: Self-pay | Admitting: Internal Medicine

## 2022-09-11 ENCOUNTER — Other Ambulatory Visit: Payer: Self-pay

## 2022-09-11 ENCOUNTER — Other Ambulatory Visit (HOSPITAL_COMMUNITY): Payer: Self-pay

## 2022-09-11 MED ORDER — HUMALOG KWIKPEN 200 UNIT/ML ~~LOC~~ SOPN
20.0000 [IU] | PEN_INJECTOR | Freq: Three times a day (TID) | SUBCUTANEOUS | 0 refills | Status: DC
  Filled 2022-09-11: qty 12, 28d supply, fill #0

## 2022-09-11 MED ORDER — UNIFINE PENTIPS 32G X 4 MM MISC
3 refills | Status: DC
  Filled 2022-09-11: qty 400, 80d supply, fill #0
  Filled 2023-03-06: qty 400, 80d supply, fill #1
  Filled 2023-08-17: qty 400, 80d supply, fill #2

## 2022-09-12 ENCOUNTER — Other Ambulatory Visit (HOSPITAL_COMMUNITY): Payer: Self-pay

## 2022-09-12 ENCOUNTER — Other Ambulatory Visit: Payer: Self-pay

## 2022-09-14 ENCOUNTER — Other Ambulatory Visit (HOSPITAL_COMMUNITY): Payer: Self-pay

## 2022-09-17 ENCOUNTER — Other Ambulatory Visit: Payer: Self-pay

## 2022-09-17 ENCOUNTER — Other Ambulatory Visit (HOSPITAL_COMMUNITY): Payer: Self-pay

## 2022-09-18 ENCOUNTER — Other Ambulatory Visit: Payer: Self-pay

## 2022-09-18 ENCOUNTER — Other Ambulatory Visit (HOSPITAL_COMMUNITY): Payer: Self-pay

## 2022-09-19 ENCOUNTER — Other Ambulatory Visit: Payer: Self-pay

## 2022-09-21 DIAGNOSIS — M25511 Pain in right shoulder: Secondary | ICD-10-CM | POA: Diagnosis not present

## 2022-09-22 ENCOUNTER — Other Ambulatory Visit: Payer: Self-pay

## 2022-09-26 DIAGNOSIS — M25511 Pain in right shoulder: Secondary | ICD-10-CM | POA: Diagnosis not present

## 2022-10-01 ENCOUNTER — Other Ambulatory Visit (HOSPITAL_COMMUNITY): Payer: Self-pay

## 2022-10-04 DIAGNOSIS — M25511 Pain in right shoulder: Secondary | ICD-10-CM | POA: Diagnosis not present

## 2022-10-08 ENCOUNTER — Ambulatory Visit: Payer: 59 | Admitting: Internal Medicine

## 2022-10-08 DIAGNOSIS — E1165 Type 2 diabetes mellitus with hyperglycemia: Secondary | ICD-10-CM | POA: Diagnosis not present

## 2022-10-08 NOTE — Progress Notes (Deleted)
Patient ID: Samantha [name], female   DOB: [birth year], 57 y.o.   MRN: [id number]  HPI: Samantha [name] is a 57 y.o.-year-old female, returning for follow-up for DM2, dx 2012, insulin-dependent since 2015, uncontrolled, withcomplications (DR) and medication noncompliance.  Last visit 4 months ago.  Interim history: She denies increased urination, blurry vision.  At last visit she has been off all of her diabetic medicines for 2 weeks due to lack of appetite. We restarted them.  Also, she was still drinking sodas and I strongly advised her to stop. Since last visit, she was able to have right shoulder surgery in 07/2022.  Sugars improved dramatically before the surgery so I can clear her for the procedure.  Reviewed HbA1c levels: Lab Results  Component Value Date   HGBA1C 7.7 (A) 06/08/2022   HGBA1C 8.5 (A) 11/09/2021   HGBA1C 6.9 (H) 04/03/2021   HGBA1C 11.9 (A) 01/12/2021   HGBA1C 7.7 (A) 03/18/2020   HGBA1C 12.5 (A) 12/24/2019   HGBA1C 10.3 (A) 06/18/2019   HGBA1C 12.1 (A) 03/16/2019   HGBA1C 10.0 (A) 11/13/2018   HGBA1C 7.4 (A) 03/27/2018   HGBA1C 8.4 12/25/2017   HGBA1C 11.7 04/18/2017   HGBA1C 8.2 08/20/2016   HGBA1C 6.7 02/27/2016   HGBA1C 9.8 11/28/2015   HGBA1C 8.5 08/18/2015   HGBA1C 8.9 (H) 05/19/2015   HGBA1C 11.3 (H) 02/15/2015   HGBA1C 12.2 (H) 09/13/2014  05/27/2014: 12% 02/2014: 9%  She is on: - off for 2 weeks at last OV >>  restarted: - Metformin 1000 mg 2x a day with meals-restarted 01/2021 >> 1000 mg with dinner - Farxiga 10 mg before b'fast - Humalog (20-)25 units before every meal >> 12-20 units 15 min before every meal (only take this if you eat) - Basaglar >> Semglee 40 units 2x a day >> 1x a day - Ozempic 0.5 mg weekly - started 01/2021 >> 1 mg weekly She was on Farxiga x 2 years in the past, but this was stopped b/c weight loss (205 >> 179 lbs), urinary frequency.  Tolerated well now. She tried regular metformin >> GI sxs.  She has a Dexcom  CGM -checking sugars more than 4 times a day:  Sugars improved before shoulder surgery:  Previously:     Lowest sugar was 54 >> 100 >> 70s; it is unclear at which level she has hypoglycemia unawareness. Highest sugar was 500 ... >> 400 >> 300s.  Pt's meals are: - Breakfast: cereal or bisquit - Lunch: hamburger, chinese - Dinner: chicken  - Snacks: 2-3: Frosted flakes! Fruit punch!  We discussed about the importance of stopping these. At last visit I strongly advised her to start drinking sodas and shakes.  -No CKD: Last BUN/creatinine: 04/05/2021: 11/0.91, GFR 74, glucose 83 Lab Results  Component Value Date   BUN 12 07/18/2022   BUN 15 04/03/2021   CREATININE 0.83 07/18/2022   CREATININE 1.03 (H) 04/03/2021  12/28/2020: 23/0.95, glucose 143 02/10/2015: 13/0.83, GFR 86 11/16/2013: 20/0.92, GFR 79  -+ HL; lipid panel: 01/05/2022: 226/146/55/144 12/28/2020: 186/207/46/104 01/29/2019: 226/265/48/125 Lab Results  Component Value Date   CHOL 130 12/25/2017   HDL 35.90 (L) 12/25/2017   LDLCALC 77 12/25/2017   TRIG 248 (H) 01/24/2020   CHOLHDL 4 12/25/2017  02/10/2015: 185/168/44/107  On Crestor.  - last eye exam was 11/17/2021: + DR - [name].  -+ numbness and tingling in her feet recently.  Last foot exam 11/09/2021.  Hypothyroidism after RAI treatment for Graves' disease -History of  medication noncompliance  Pt is on levothyroxine 100 mcg daily: - in am - fasting - at least 30 min from b'fast - no Ca, Fe, MVI - no PPIs - not on Biotin  Review TFTs: Lab Results  Component Value Date   TSH 2.15 07/02/2022   TSH 31.90 (H) 06/08/2022   TSH 3.75 01/12/2021   TSH 0.38 (A) 01/14/2020   TSH 1.95 06/18/2019   FREET4 0.92 07/02/2022   FREET4 0.55 (L) 06/08/2022   FREET4 0.81 01/12/2021   FREET4 0.76 06/18/2019   FREET4 0.83 03/27/2018  07/28/2013: TSH 1.69  Pt denies: - feeling nodules in neck - hoarseness - dysphagia - choking  ROS + see HPI  I  reviewed pt's medications, allergies, PMH, social hx, family hx, and changes were documented in the history of present illness. Otherwise, unchanged from my initial visit note.  Past Medical History:  Diagnosis Date   Anemia    Anxiety    Arthritis    Chest pain of uncertain etiology    Depression    Diabetes mellitus without complication ([place])    Type 2   Dyspnea    Hyperlipidemia    Hypothyroidism    Palpitations    Pneumonia    Thyroid disease    Urticaria    Past Surgical History:  Procedure Laterality Date   CESAREAN SECTION     SHOULDER ARTHROSCOPY WITH ROTATOR CUFF REPAIR AND SUBACROMIAL DECOMPRESSION Right 07/25/2022   Procedure: Right shoulder arthroscopy, subacromial decompression, debridement, mini open rotator cuff repair;  Surgeon: Susa Day, MD;  Location: [facility];  Service: Orthopedics;  Laterality: Right;   UTERINE FIBROID EMBOLIZATION     WISDOM TOOTH EXTRACTION     Social History   Socioeconomic History   Marital status: Single    Spouse name: Not on file   Number of children: Not on file   Years of education: Not on file   Highest education level: Not on file  Occupational History   Not on file  Tobacco Use   Smoking status: Never   Smokeless tobacco: Never  Vaping Use   Vaping Use: Never used  Substance and Sexual Activity   Alcohol use: No    Alcohol/week: 0.0 standard drinks of alcohol   Drug use: Never   Sexual activity: Not on file  Other Topics Concern   Not on file  Social History Narrative   Not on file   Social Determinants of Health   Financial Resource Strain: Not on file  Food Insecurity: Not on file  Transportation Needs: Not on file  Physical Activity: Not on file  Stress: Not on file  Social Connections: Not on file  Intimate Partner Violence: Not on file   Current Outpatient Medications on File Prior to Visit  Medication Sig Dispense Refill   aspirin EC 81 MG tablet Take 1 tablet (81 mg total) by mouth daily. Day  after surgery 30 tablet 1   cephALEXin (KEFLEX) 500 MG capsule Take 1 capsule by mouth 4 times daily. Starting 6 hours after scheduled surgery time 6 capsule 1   cetirizine (ZYRTEC) 10 MG tablet Take 1 tablet twice daily as needed (Patient taking differently: Take 10 mg by mouth See admin instructions. Take 1 tablet twice daily as needed for allergies) 60 tablet 5   clobetasol (TEMOVATE) 0.05 % external solution Apply a small amount to scalp twice a day 25 mL 3   Continuous Blood Gluc Sensor (DEXCOM G7 SENSOR) MISC Use one sensor every 10  days 3 each 1   Continuous Blood Gluc Transmit (DEXCOM G6 TRANSMITTER) MISC Use every 3 (three) months. 1 each 3   dapagliflozin propanediol (FARXIGA) 10 MG TABS tablet TAKE 1 TABLET BY MOUTH DAILY (Patient taking differently: Take 10 mg by mouth daily.) 90 tablet 1   docusate sodium (COLACE) 100 MG capsule Take 1 capsule (100 mg total) by mouth 2 (two) times daily as needed for mild constipation. 30 capsule 1   fluticasone (FLONASE) 50 MCG/ACT nasal spray USE 2 SPRAYS IN EACH NOSTRIL ONCE A DAY (Patient taking differently: Place 2 sprays into both nostrils daily as needed for allergies.) 48 g 3   fluticasone-salmeterol (ADVAIR DISKUS) 250-50 MCG/ACT AEPB Inhale 1 puff into the lungs in the morning and at bedtime. (Patient taking differently: Inhale 1 puff into the lungs 2 (two) times daily as needed (shortness of breath/wheezing).) 60 each 11   glucose blood (ONETOUCH VERIO) test strip Use as instructed to check blood sugar daily 100 each 12   ibuprofen (ADVIL) 800 MG tablet Take 1 tablet by mouth every 8 hours as needed for pain with food 60 tablet 0   insulin glargine-yfgn (SEMGLEE, YFGN,) 100 UNIT/ML Pen Inject 40 Units into the skin at bedtime. 30 mL 1   insulin glargine-yfgn (SEMGLEE, YFGN,) 100 UNIT/ML Pen Inject 40 Units into the skin in the morning and at bedtime. 30 mL 0   insulin lispro (HUMALOG KWIKPEN) 200 UNIT/ML KwikPen Inject 20-25 units into the skin  before meals and  10-15 units before a snack. (up to 100 units daily). 12 mL 0   Insulin Pen Needle (UNIFINE PENTIPS) 32G X 4 MM MISC Use 5 times a day 400 each 3   Lancets (FREESTYLE) lancets USE TO CHECK BLOOD SUGAR 4 TIMES DAILY 100 each 12   levothyroxine (SYNTHROID) 100 MCG tablet Take 1 tablet (100 mcg total) by mouth daily before breakfast. 30 tablet 0   metFORMIN (GLUCOPHAGE XR) 500 MG 24 hr tablet Take 2 tablets (1,000 mg total) by mouth daily with supper. 180 tablet 3   metFORMIN (GLUCOPHAGE-XR) 500 MG 24 hr tablet Take 2 tablets (1,000 mg total) by mouth 2 (two) times daily with a meal. (Patient taking differently: Take 1,000 mg by mouth daily with supper.) 360 tablet 3   methocarbamol (ROBAXIN-750) 750 MG tablet Take 1 tablet (750 mg total) by mouth every 8 (eight) hours as needed for muscle spasms. 30 tablet 1   omeprazole (PRILOSEC) 40 MG capsule Take 1 capsule (40 mg total) by mouth 2 (two) times daily as needed. (Patient not taking: Reported on 07/18/2022) 180 capsule 1   ondansetron (ZOFRAN) 4 MG tablet Take 1 tablet  by mouth as directed. Take 1 tablet 30 - 60 minutes prior to each colonoscopy prep dose (Patient not taking: Reported on 07/18/2022) 2 tablet 0   OneTouch Delica Lancets 99991111 MISC Use to check blood sugar daily 100 each 11   oxyCODONE (OXY IR/ROXICODONE) 5 MG immediate release tablet Take 1 tablet (5 mg total) by mouth every 4 (four) hours as needed for severe pain. 40 tablet 0   oxyCODONE (OXY IR/ROXICODONE) 5 MG immediate release tablet Take 1 tablet (5 mg total) by mouth every 6 - 8 hours as needed for severe pain 40 tablet 0   polyethylene glycol (MIRALAX / GLYCOLAX) 17 g packet Take 17 g by mouth daily. 14 each 0   rosuvastatin (CRESTOR) 5 MG tablet Take 1 tablet (5 mg total) by mouth daily. 90 tablet 3  Semaglutide, 1 MG/DOSE, (OZEMPIC, 1 MG/DOSE,) 4 MG/3ML SOPN Inject 1 mg into the skin once a week. 9 mL 3   sertraline (ZOLOFT) 100 MG tablet Take 1 tablet (100 mg  total) by mouth daily. 90 tablet 1   sertraline (ZOLOFT) 100 MG tablet Take 1 tablet (100 mg total) by mouth daily. 90 tablet 1   triamcinolone (NASACORT ALLERGY 24HR) 55 MCG/ACT AERO nasal inhaler Use 2 sparys in each nostril  Once a day (Patient taking differently: Place 2 sprays into the nose daily.) 1 each 12   Vitamin D, Ergocalciferol, 50 MCG (2000 UT) CAPS Take 2,000 Units by mouth daily.     [DISCONTINUED] loratadine (CLARITIN) 10 MG tablet TAKE 1 TABLET BY MOUTH ONCE A DAY (Patient taking differently: Take 10 mg by mouth daily as needed for allergies.) 90 tablet 3   Current Facility-Administered Medications on File Prior to Visit  Medication Dose Route Frequency Provider Last Rate Last Admin   methocarbamol (ROBAXIN) tablet 500 mg  500 mg Oral TID Susa Day, MD       Allergies  Allergen Reactions   Bactrim [Sulfamethoxazole-Trimethoprim] Other (See Comments)    Appears to be causing DRESS   Bupropion Hives   Augmentin [Amoxicillin-Pot Clavulanate] Itching   Cyclobenzaprine Other (See Comments)    Excessive sleepiness   Lexapro [Escitalopram] Diarrhea and Nausea Only   Pravastatin Other (See Comments)    Leg cramps/ mylagia   Atorvastatin Other (See Comments)    Leg cramps   Doxepin Hcl Other (See Comments)    Nightmares    Family History  Problem Relation Age of Onset   Dementia Mother    Diabetes Mother    Stroke Father    Colon polyps Neg Hx    Colon cancer Neg Hx    Esophageal cancer Neg Hx    Stomach cancer Neg Hx    Rectal cancer Neg Hx     PE: LMP 07/10/2012    Wt Readings from Last 3 Encounters:  07/25/22 180 lb 3.2 oz (81.7 kg)  07/18/22 180 lb 3.2 oz (81.7 kg)  06/08/22 186 lb 12.8 oz (84.7 kg)   Constitutional: overweight, in NAD Eyes:  EOMI, no exophthalmos ENT: no neck masses, no cervical lymphadenopathy Cardiovascular: RRR, No MRG Respiratory: CTA B Musculoskeletal: no deformities Skin:no rashes Neurological: no tremor with outstretched  hands  ASSESSMENT: 1. DM2, insulin-dependent, uncontrolled, with complications - DR  No family history of medullary thyroid cancer or personal history of pancreatitis.  2. Hypothyroidism - postablative for Graves ds.  3. HL  PLAN:  1.  Patient with history of uncontrolled type 2 diabetes, with spectacular improvement in her diabetes when she eliminates sweet rings from the diet.  In the past, HbA1c decreased from 11.9% to 6.9% and she also lost 19 pounds.  However, afterwards, she was lost for follow-up and restarted drinking sodas and also had to have steroid injections.  At last visit, I could not clear her for surgery as the blood sugars are very high.  She was drinking sweet drinks again and was off all of her medicines due to lack of appetite.  We restarted these and I strongly advised her to stop sodas.  Her sugars improved significantly in the next month and I could clear her for surgery.  She had arthroscopic shoulder surgery in 07/2022. -Latest HbA1c was 7.7% at last visit. CGM interpretation: -At today's visit, we reviewed her CGM downloads: It appears that *** of values are in target range (  goal >70%), while *** are higher than 180 (goal <25%), and *** are lower than 70 (goal <4%).  The calculated average blood sugar is ***.  The projected HbA1c for the next 3 months ([facility]) is ***. -Reviewing the CGM trends, ***  - I suggested to:  Patient Instructions  Please change: - Metformin 1000 mg with dinner - Farxiga 10 mg before b'fast - Humalog 12-20 units 15 min before every meal (only take this if you eat) - Semglee 40 units 1x a day - Ozempic 0.5 mg weekly (36 clicks on the 1 mg pen)  STOP SWEET DRINKS!  Please return in 4 months.   - we checked her HbA1c: 7%  - advised to check sugars at different times of the day - 4x a day, rotating check times - advised for yearly eye exams >> she is UTD - return to clinic in 4 months  2. Hypothyroidism -At last visit, TSH was very  high, at 31.9.  Unfortunately, I could not clear her for surgery because of this.  I advised her to stay on the same dose of LT4, take it daily, and return for labs.  A new TSH obtained in 06/2022 was normal: Lab Results  Component Value Date   TSH 2.15 07/02/2022  - she continues on LT4 100 mcg daily - pt feels good on this dose. - we discussed about taking the thyroid hormone every day, with water, >30 minutes before breakfast, separated by >4 hours from acid reflux medications, calcium, iron, multivitamins. Pt. is taking it correctly. - will check thyroid tests at next visit  3. HL  -Reviewed latest lipid panel from 12/2021: 226/146/55/144 -LDL elevated -Continue Crestor 5 mg daily without side effects  Philemon Kingdom, MD PhD [facility]

## 2022-10-12 ENCOUNTER — Ambulatory Visit: Payer: 59 | Admitting: Internal Medicine

## 2022-10-12 ENCOUNTER — Encounter: Payer: Self-pay | Admitting: Internal Medicine

## 2022-10-12 VITALS — BP 124/82 | HR 88 | Ht 65.5 in | Wt 185.8 lb

## 2022-10-12 DIAGNOSIS — E11311 Type 2 diabetes mellitus with unspecified diabetic retinopathy with macular edema: Secondary | ICD-10-CM

## 2022-10-12 DIAGNOSIS — E782 Mixed hyperlipidemia: Secondary | ICD-10-CM | POA: Diagnosis not present

## 2022-10-12 DIAGNOSIS — E1165 Type 2 diabetes mellitus with hyperglycemia: Secondary | ICD-10-CM

## 2022-10-12 DIAGNOSIS — E890 Postprocedural hypothyroidism: Secondary | ICD-10-CM | POA: Diagnosis not present

## 2022-10-12 LAB — POCT GLYCOSYLATED HEMOGLOBIN (HGB A1C): Hemoglobin A1C: 7.8 % — AB (ref 4.0–5.6)
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
## 2022-10-12 NOTE — Patient Instructions (Addendum)
Please change: - Metformin 1000 mg with dinner - Farxiga 10 mg before b'fast - Humalog 12-20 units 15 min before every meal (only take this if you eat) - Semglee 40 units daily - Ozempic 0.5 mg weekly (36 clicks on the 1 mg pen)  STOP SODAS!!!!  Please continue Levothyroxine 100 mcg daily.  Take the thyroid hormone every day, with water, at least 30 minutes before breakfast, separated by at least 4 hours from: - acid reflux medications - calcium - iron - multivitamins  Please return in 4 months.

## 2022-10-17 ENCOUNTER — Other Ambulatory Visit (HOSPITAL_COMMUNITY): Payer: Self-pay

## 2022-10-18 ENCOUNTER — Other Ambulatory Visit (HOSPITAL_COMMUNITY): Payer: Self-pay

## 2022-10-18 MED ORDER — CLOBETASOL PROPIONATE 0.05 % EX SOLN
CUTANEOUS | 0 refills | Status: DC
  Filled 2022-10-18: qty 25, 14d supply, fill #0

## 2022-10-22 DIAGNOSIS — R8290 Unspecified abnormal findings in urine: Secondary | ICD-10-CM | POA: Diagnosis not present

## 2022-10-22 DIAGNOSIS — G9331 Postviral fatigue syndrome: Secondary | ICD-10-CM | POA: Diagnosis not present

## 2022-10-22 DIAGNOSIS — Z23 Encounter for immunization: Secondary | ICD-10-CM | POA: Diagnosis not present

## 2022-10-23 ENCOUNTER — Other Ambulatory Visit (HOSPITAL_COMMUNITY): Payer: Self-pay

## 2022-10-23 ENCOUNTER — Other Ambulatory Visit: Payer: Self-pay

## 2022-10-23 DIAGNOSIS — M25562 Pain in left knee: Secondary | ICD-10-CM | POA: Diagnosis not present

## 2022-10-23 DIAGNOSIS — M542 Cervicalgia: Secondary | ICD-10-CM | POA: Diagnosis not present

## 2022-10-23 MED ORDER — IBUPROFEN 800 MG PO TABS
800.0000 mg | ORAL_TABLET | Freq: Three times a day (TID) | ORAL | 1 refills | Status: DC | PRN
  Filled 2022-10-23: qty 30, 10d supply, fill #0

## 2022-10-24 ENCOUNTER — Other Ambulatory Visit (HOSPITAL_COMMUNITY): Payer: Self-pay

## 2022-10-27 ENCOUNTER — Other Ambulatory Visit: Payer: Self-pay | Admitting: Internal Medicine

## 2022-10-28 MED ORDER — INSULIN GLARGINE-YFGN 100 UNIT/ML ~~LOC~~ SOPN
40.0000 [IU] | PEN_INJECTOR | Freq: Two times a day (BID) | SUBCUTANEOUS | 1 refills | Status: DC
  Filled 2022-10-28: qty 30, 38d supply, fill #0
  Filled 2023-01-23: qty 30, 38d supply, fill #1

## 2022-10-29 ENCOUNTER — Other Ambulatory Visit: Payer: Self-pay

## 2022-10-29 ENCOUNTER — Other Ambulatory Visit (HOSPITAL_COMMUNITY): Payer: Self-pay

## 2022-11-01 ENCOUNTER — Other Ambulatory Visit (HOSPITAL_COMMUNITY): Payer: Self-pay

## 2022-11-05 ENCOUNTER — Other Ambulatory Visit (HOSPITAL_COMMUNITY): Payer: Self-pay

## 2022-11-05 ENCOUNTER — Other Ambulatory Visit: Payer: Self-pay

## 2022-11-07 DIAGNOSIS — E1165 Type 2 diabetes mellitus with hyperglycemia: Secondary | ICD-10-CM | POA: Diagnosis not present

## 2022-11-23 ENCOUNTER — Other Ambulatory Visit (HOSPITAL_COMMUNITY): Payer: Self-pay

## 2022-11-23 ENCOUNTER — Other Ambulatory Visit: Payer: Self-pay

## 2022-11-23 DIAGNOSIS — M79642 Pain in left hand: Secondary | ICD-10-CM | POA: Diagnosis not present

## 2022-11-28 DIAGNOSIS — H5213 Myopia, bilateral: Secondary | ICD-10-CM | POA: Diagnosis not present

## 2022-11-28 LAB — HM DIABETES EYE EXAM

## 2022-12-02 ENCOUNTER — Other Ambulatory Visit (HOSPITAL_COMMUNITY): Payer: Self-pay

## 2022-12-02 ENCOUNTER — Encounter: Payer: Self-pay | Admitting: Internal Medicine

## 2022-12-02 ENCOUNTER — Other Ambulatory Visit: Payer: Self-pay

## 2022-12-02 ENCOUNTER — Other Ambulatory Visit: Payer: Self-pay | Admitting: Internal Medicine

## 2022-12-02 MED ORDER — LEVOTHYROXINE SODIUM 100 MCG PO TABS
100.0000 ug | ORAL_TABLET | Freq: Every day | ORAL | 1 refills | Status: DC
  Filled 2022-12-02: qty 90, 90d supply, fill #0
  Filled 2023-03-06: qty 90, 90d supply, fill #1

## 2022-12-03 ENCOUNTER — Other Ambulatory Visit: Payer: Self-pay

## 2022-12-07 DIAGNOSIS — E1165 Type 2 diabetes mellitus with hyperglycemia: Secondary | ICD-10-CM | POA: Diagnosis not present

## 2022-12-10 DIAGNOSIS — S60212A Contusion of left wrist, initial encounter: Secondary | ICD-10-CM | POA: Diagnosis not present

## 2022-12-10 DIAGNOSIS — M1812 Unilateral primary osteoarthritis of first carpometacarpal joint, left hand: Secondary | ICD-10-CM | POA: Diagnosis not present

## 2022-12-10 MED ORDER — CLOBETASOL PROPIONATE 0.05 % EX SOLN
CUTANEOUS | 2 refills | Status: DC
  Filled 2022-12-10: qty 25, 30d supply, fill #0
  Filled 2023-03-15: qty 25, 12d supply, fill #1
  Filled 2023-05-17: qty 25, 12d supply, fill #2

## 2022-12-11 ENCOUNTER — Other Ambulatory Visit (HOSPITAL_COMMUNITY): Payer: Self-pay

## 2022-12-11 MED ORDER — MELOXICAM 7.5 MG PO TABS
7.5000 mg | ORAL_TABLET | Freq: Every day | ORAL | 0 refills | Status: DC
  Filled 2022-12-11: qty 30, 30d supply, fill #0

## 2022-12-29 ENCOUNTER — Other Ambulatory Visit (HOSPITAL_COMMUNITY): Payer: Self-pay

## 2022-12-29 ENCOUNTER — Other Ambulatory Visit: Payer: Self-pay | Admitting: Internal Medicine

## 2022-12-30 MED FILL — Insulin Lispro Soln Pen-injector 200 Unit/ML: SUBCUTANEOUS | 28 days supply | Qty: 12 | Fill #0 | Status: AC

## 2022-12-31 ENCOUNTER — Other Ambulatory Visit: Payer: Self-pay

## 2023-01-01 ENCOUNTER — Other Ambulatory Visit (HOSPITAL_COMMUNITY): Payer: Self-pay

## 2023-01-02 ENCOUNTER — Other Ambulatory Visit (HOSPITAL_COMMUNITY): Payer: Self-pay

## 2023-01-03 ENCOUNTER — Other Ambulatory Visit: Payer: Self-pay

## 2023-01-04 ENCOUNTER — Other Ambulatory Visit: Payer: Self-pay

## 2023-01-07 DIAGNOSIS — E1165 Type 2 diabetes mellitus with hyperglycemia: Secondary | ICD-10-CM | POA: Diagnosis not present

## 2023-01-08 DIAGNOSIS — Z0289 Encounter for other administrative examinations: Secondary | ICD-10-CM

## 2023-01-09 ENCOUNTER — Other Ambulatory Visit (HOSPITAL_COMMUNITY): Payer: Self-pay

## 2023-01-09 ENCOUNTER — Encounter (INDEPENDENT_AMBULATORY_CARE_PROVIDER_SITE_OTHER): Payer: Self-pay | Admitting: Family Medicine

## 2023-01-16 ENCOUNTER — Other Ambulatory Visit (HOSPITAL_COMMUNITY): Payer: Self-pay

## 2023-01-16 DIAGNOSIS — E1121 Type 2 diabetes mellitus with diabetic nephropathy: Secondary | ICD-10-CM | POA: Diagnosis not present

## 2023-01-16 DIAGNOSIS — J309 Allergic rhinitis, unspecified: Secondary | ICD-10-CM | POA: Diagnosis not present

## 2023-01-16 DIAGNOSIS — Z124 Encounter for screening for malignant neoplasm of cervix: Secondary | ICD-10-CM | POA: Diagnosis not present

## 2023-01-16 DIAGNOSIS — E039 Hypothyroidism, unspecified: Secondary | ICD-10-CM | POA: Diagnosis not present

## 2023-01-16 DIAGNOSIS — E559 Vitamin D deficiency, unspecified: Secondary | ICD-10-CM | POA: Diagnosis not present

## 2023-01-16 DIAGNOSIS — E785 Hyperlipidemia, unspecified: Secondary | ICD-10-CM | POA: Diagnosis not present

## 2023-01-16 DIAGNOSIS — K760 Fatty (change of) liver, not elsewhere classified: Secondary | ICD-10-CM | POA: Diagnosis not present

## 2023-01-16 DIAGNOSIS — Z0000 Encounter for general adult medical examination without abnormal findings: Secondary | ICD-10-CM | POA: Diagnosis not present

## 2023-01-16 DIAGNOSIS — E669 Obesity, unspecified: Secondary | ICD-10-CM | POA: Diagnosis not present

## 2023-01-16 DIAGNOSIS — F411 Generalized anxiety disorder: Secondary | ICD-10-CM | POA: Diagnosis not present

## 2023-01-16 DIAGNOSIS — F324 Major depressive disorder, single episode, in partial remission: Secondary | ICD-10-CM | POA: Diagnosis not present

## 2023-01-16 MED ORDER — IBUPROFEN 800 MG PO TABS
800.0000 mg | ORAL_TABLET | Freq: Three times a day (TID) | ORAL | 0 refills | Status: DC | PRN
  Filled 2023-01-16: qty 60, 20d supply, fill #0

## 2023-01-16 MED ORDER — TRIAMCINOLONE ACETONIDE 55 MCG/ACT NA AERO
2.0000 | INHALATION_SPRAY | Freq: Every day | NASAL | 3 refills | Status: DC | PRN
  Filled 2023-01-16: qty 50.7, 90d supply, fill #0

## 2023-01-16 MED ORDER — ROSUVASTATIN CALCIUM 5 MG PO TABS
5.0000 mg | ORAL_TABLET | Freq: Every day | ORAL | 3 refills | Status: DC
  Filled 2023-01-16: qty 90, 90d supply, fill #0
  Filled 2023-06-06: qty 90, 90d supply, fill #1

## 2023-01-16 MED ORDER — SERTRALINE HCL 100 MG PO TABS
100.0000 mg | ORAL_TABLET | Freq: Every day | ORAL | 1 refills | Status: DC
  Filled 2023-01-16: qty 90, 90d supply, fill #0
  Filled 2023-06-06: qty 90, 90d supply, fill #1

## 2023-01-24 ENCOUNTER — Other Ambulatory Visit: Payer: Self-pay

## 2023-01-28 ENCOUNTER — Other Ambulatory Visit (HOSPITAL_COMMUNITY): Payer: Self-pay

## 2023-02-08 ENCOUNTER — Encounter: Payer: Self-pay | Admitting: Internal Medicine

## 2023-02-11 ENCOUNTER — Other Ambulatory Visit: Payer: Self-pay

## 2023-02-11 DIAGNOSIS — E11311 Type 2 diabetes mellitus with unspecified diabetic retinopathy with macular edema: Secondary | ICD-10-CM

## 2023-02-13 ENCOUNTER — Ambulatory Visit (INDEPENDENT_AMBULATORY_CARE_PROVIDER_SITE_OTHER): Payer: 59 | Admitting: Family Medicine

## 2023-02-13 ENCOUNTER — Encounter (INDEPENDENT_AMBULATORY_CARE_PROVIDER_SITE_OTHER): Payer: Self-pay | Admitting: Family Medicine

## 2023-02-13 VITALS — BP 126/78 | HR 92 | Temp 99.0°F | Ht 65.0 in | Wt 183.0 lb

## 2023-02-13 DIAGNOSIS — Z7985 Long-term (current) use of injectable non-insulin antidiabetic drugs: Secondary | ICD-10-CM

## 2023-02-13 DIAGNOSIS — Z0289 Encounter for other administrative examinations: Secondary | ICD-10-CM

## 2023-02-13 DIAGNOSIS — E1165 Type 2 diabetes mellitus with hyperglycemia: Secondary | ICD-10-CM

## 2023-02-13 DIAGNOSIS — Z6830 Body mass index (BMI) 30.0-30.9, adult: Secondary | ICD-10-CM | POA: Diagnosis not present

## 2023-02-13 DIAGNOSIS — E669 Obesity, unspecified: Secondary | ICD-10-CM

## 2023-02-13 DIAGNOSIS — Z794 Long term (current) use of insulin: Secondary | ICD-10-CM

## 2023-02-13 DIAGNOSIS — E668 Other obesity: Secondary | ICD-10-CM

## 2023-02-13 NOTE — Progress Notes (Unsigned)
Chief Complaint:   OBESITY Ah is here to discuss her progress with her obesity treatment plan along with follow-up of her obesity related diagnoses. Samantha [name] states she is doing 0 minutes 0 times per week.  Today's weight: 183 lbs Today's date: 02/13/2023  Interim History: Patient is here for nutritional plan.  Works from home 8-4:30.  Food recall: normally doesn't eat first thing in the am.  Then will eat a [place] or ham sandwich and chips and sparkling water or kool aid.   This is normally around 10am. Normally 2 slices of ham and 2 slices of [place] with cheese with 1 tsp of mayo.  Handful of chips out of a big bag.   Feels full from this.  Next time she eats would be a snack- brownies (2), m&m's (palm full), cookies (nutter butter or oreos).  She will eat another meal after work- salad with cheese, carrots, purple cabbage, ranch dressing, boiled chicken (1/2 a chicken breast).  After dinner she ate 2 brownies and drank kool aid and water.   Normally shops at [employer].  Trying to decrease total amount of snacks available to herself throughout the day.  Subjective:   1. Type 2 diabetes mellitus with hyperglycemia, with long-term current use of insulin (HCC) Patient's fasting blood sugars range between 170s-200s.  She takes insulin before every meal and once a day.  She is on [place].   Assessment/Plan:   1. Type 2 diabetes mellitus with hyperglycemia, with long-term current use of insulin (HCC) Hypoglycemia protocol was discussed today; unlikely the patient will be having hypoglycemia given blood sugars currently.  2. Class 1 obesity with serious comorbidity and body mass index (BMI) of 30.0 to 30.9 in adult, unspecified obesity type Samantha [name] is currently in the action stage of change. As such, her goal is to continue with weight loss efforts. She has agreed to keeping a food journal and adhering to recommended goals of 1000-1100 calories and 75+ grams of protein  daily.   Exercise goals: No exercise has been prescribed at this time.  Behavioral modification strategies: increasing lean protein intake, meal planning and cooking strategies, keeping healthy foods in the home, planning for success, and keeping a strict food journal.  Sarie has agreed to follow-up with our clinic in 2 weeks. She was informed of the importance of frequent follow-up visits to maximize her success with intensive lifestyle modifications for her multiple health conditions.   Objective:   Blood pressure 126/78, pulse 92, temperature 99 F (37.2 C), height 5\' 5"  (1.651 m), weight 183 lb (83 kg), last menstrual period 07/10/2012, SpO2 99 %. Body mass index is 30.45 kg/m.  General: Cooperative, alert, well developed, in no acute distress. HEENT: Conjunctivae and lids unremarkable. Cardiovascular: Regular rhythm.  Lungs: Normal work of breathing. Neurologic: No focal deficits.   Lab Results  Component Value Date   CREATININE 0.83 07/18/2022   BUN 12 07/18/2022   NA 144 07/18/2022   K 4.1 07/18/2022   CL 108 07/18/2022   CO2 26 07/18/2022   Lab Results  Component Value Date   ALT 28 04/02/2021   AST 25 04/02/2021   ALKPHOS 79 04/02/2021   BILITOT 0.3 04/02/2021   Lab Results  Component Value Date   HGBA1C 7.8 (A) 10/12/2022   HGBA1C 7.7 (A) 06/08/2022   HGBA1C 8.5 (A) 11/09/2021   HGBA1C 6.9 (H) 04/03/2021   HGBA1C 11.9 (A) 01/12/2021   No results found for: "INSULIN" Lab Results  Component Value Date   TSH 2.15 07/02/2022   Lab Results  Component Value Date   CHOL 130 12/25/2017   HDL 35.90 (L) 12/25/2017   LDLCALC 77 12/25/2017   TRIG 248 (H) 01/24/2020   CHOLHDL 4 12/25/2017   No results found for: "VD25OH" Lab Results  Component Value Date   WBC 7.9 07/18/2022   HGB 13.1 07/18/2022   HCT 41.1 07/18/2022   MCV 83.7 07/18/2022   PLT 406 (H) 07/18/2022   Lab Results  Component Value Date   FERRITIN 272 01/24/2020   Attestation  Statements:   Reviewed by clinician on day of visit: allergies, medications, problem list, medical history, surgical history, family history, social history, and previous encounter notes.  Time spent on visit including pre-visit chart review and post-visit care and charting was 40 minutes.   I, Burt Knack, am acting as transcriptionist for Reuben Likes, MD.  This is the patient's first visit at [facility]. The patient's NEW PATIENT PACKET was reviewed at length. Included in the packet: current and past health history, medications, allergies, ROS, gynecologic history (women only), surgical history, family history, social history, weight history, weight loss surgery history (for those that have had weight loss surgery), nutritional evaluation, mood and food questionnaire, PHQ9, Epworth questionnaire, sleep habits questionnaire, patient life and health improvement goals questionnaire. These will all be scanned into the patient's chart under media.   During the visit, I independently reviewed the patient's EKG, bioimpedance scale results, and indirect calorimeter results. I used this information to tailor a meal plan for the patient that will help her to lose weight and will improve her obesity-related conditions going forward. I performed a medically necessary appropriate examination and/or evaluation. I discussed the assessment and treatment plan with the patient. The patient was provided an opportunity to ask questions and all were answered. The patient agreed with the plan and demonstrated an understanding of the instructions. Labs were ordered at this visit and will be reviewed at the next visit unless more critical results need to be addressed immediately. Clinical information was updated and documented in the EMR.    I have reviewed the above documentation for accuracy and completeness, and I agree with the above. - Reuben Likes, MD

## 2023-02-22 ENCOUNTER — Other Ambulatory Visit: Payer: Self-pay

## 2023-02-22 ENCOUNTER — Other Ambulatory Visit (HOSPITAL_COMMUNITY): Payer: Self-pay

## 2023-02-28 ENCOUNTER — Encounter: Payer: Self-pay | Admitting: Internal Medicine

## 2023-02-28 ENCOUNTER — Ambulatory Visit: Payer: 59 | Admitting: Internal Medicine

## 2023-02-28 VITALS — BP 120/80 | HR 94 | Ht 65.0 in | Wt 186.0 lb

## 2023-02-28 DIAGNOSIS — Z794 Long term (current) use of insulin: Secondary | ICD-10-CM

## 2023-02-28 DIAGNOSIS — Z7984 Long term (current) use of oral hypoglycemic drugs: Secondary | ICD-10-CM | POA: Diagnosis not present

## 2023-02-28 DIAGNOSIS — E11311 Type 2 diabetes mellitus with unspecified diabetic retinopathy with macular edema: Secondary | ICD-10-CM

## 2023-02-28 DIAGNOSIS — E782 Mixed hyperlipidemia: Secondary | ICD-10-CM

## 2023-02-28 DIAGNOSIS — E1165 Type 2 diabetes mellitus with hyperglycemia: Secondary | ICD-10-CM | POA: Diagnosis not present

## 2023-02-28 DIAGNOSIS — E890 Postprocedural hypothyroidism: Secondary | ICD-10-CM

## 2023-02-28 DIAGNOSIS — Z7985 Long-term (current) use of injectable non-insulin antidiabetic drugs: Secondary | ICD-10-CM | POA: Diagnosis not present

## 2023-02-28 DIAGNOSIS — E119 Type 2 diabetes mellitus without complications: Secondary | ICD-10-CM

## 2023-02-28 LAB — POCT GLYCOSYLATED HEMOGLOBIN (HGB A1C): Hemoglobin A1C: 7.9 % — AB (ref 4.0–5.6)

## 2023-02-28 NOTE — Progress Notes (Signed)
Patient ID: Samantha [name], female   DOB: 1966-09-09, 57 y.o.   MRN: [id number]  HPI: Samantha [name] is a 57 y.o.-year-old female, returning for follow-up for DM2, dx 2012, insulin-dependent since 2015, uncontrolled, withcomplications (DR) and medication noncompliance.  Last visit 4 months ago.  Interim history: She denies nausea, chest pain, blurry vision.  At last visit, she was still drinking regular sodas and I strongly advised her to stop. She now stopped after she started to go to the Weight management clinic.  Sugars improved afterwards.  Reviewed HbA1c levels: Lab Results  Component Value Date   HGBA1C 7.8 (A) 10/12/2022   HGBA1C 7.7 (A) 06/08/2022   HGBA1C 8.5 (A) 11/09/2021   HGBA1C 6.9 (H) 04/03/2021   HGBA1C 11.9 (A) 01/12/2021   HGBA1C 7.7 (A) 03/18/2020   HGBA1C 12.5 (A) 12/24/2019   HGBA1C 10.3 (A) 06/18/2019   HGBA1C 12.1 (A) 03/16/2019   HGBA1C 10.0 (A) 11/13/2018   HGBA1C 7.4 (A) 03/27/2018   HGBA1C 8.4 12/25/2017   HGBA1C 11.7 04/18/2017   HGBA1C 8.2 08/20/2016   HGBA1C 6.7 02/27/2016   HGBA1C 9.8 11/28/2015   HGBA1C 8.5 08/18/2015   HGBA1C 8.9 (H) 05/19/2015   HGBA1C 11.3 (H) 02/15/2015   HGBA1C 12.2 (H) 09/13/2014  05/27/2014: 12% 02/2014: 9%  She is on:  - Metformin 1000 mg 2x a day with meals-restarted 01/2021 >> 1000 mg with dinner >> stopped 2 mo 2/2 nausea - Farxiga 10 mg before b'fast - Humalog (20-)25 units before every meal >> 12-20 >> 25 units 15 min before every meal  - Basaglar >> Semglee 40 units 2x a day >> 1x a day - Ozempic 0.5 mg weekly - started 01/2021 >> 1 mg weekly She was on Farxiga x 2 years in the past, but this was stopped b/c weight loss (205 >> 179 lbs), urinary frequency.  Tolerated well now. She tried regular metformin >> GI sxs.  She has a Dexcom CGM -checking sugars more than 4 times a day:   Previously:  Sugars improved before shoulder surgery:   Lowest sugar was 54 >> 100 >> 70s >> 86 >> 55; it is unclear  at which level she has hypoglycemia unawareness. Highest sugar was 500 ... >> 400 >> 300s >> 300s >> 300.  Pt's meals are: - Breakfast: cereal or bisquit - Lunch: hamburger, chinese - Dinner: chicken  - Snacks: 2-3: Frosted flakes! Fruit punch!  We discussed about the importance of stopping these.  -No CKD: Last BUN/creatinine: 01/16/2023: 20/0.88, GFR 77, Glu 146 Lab Results  Component Value Date   BUN 12 07/18/2022   BUN 15 04/03/2021   CREATININE 0.83 07/18/2022   CREATININE 1.03 (H) 04/03/2021   -+ HL; lipid panel: 07/04/2022: 150/128/37/93 01/05/2022: 226/146/55/144 12/28/2020: 186/207/46/104 01/29/2019: 226/265/48/125 Lab Results  Component Value Date   CHOL 130 12/25/2017   HDL 35.90 (L) 12/25/2017   LDLCALC 77 12/25/2017   TRIG 248 (H) 01/24/2020   CHOLHDL 4 12/25/2017  02/10/2015: 185/168/44/107  On Crestor.  - last eye exam was 11/28/2022: No DR, previously + DR - [facility].  -+ numbness and tingling in her feet recently.  Last foot exam 10/14/2022.  Hypothyroidism after RAI treatment for Graves' disease -History of medication noncompliance  Pt is on levothyroxine 100 mcg daily: - in am - fasting - at least 30 min from b'fast - no Ca, Fe, MVI - no PPIs - not on Biotin  Review TFTs: Lab Results  Component Value Date  TSH 2.15 07/02/2022   TSH 31.90 (H) 06/08/2022   TSH 3.75 01/12/2021   TSH 0.38 (A) 01/14/2020   TSH 1.95 06/18/2019   FREET4 0.92 07/02/2022   FREET4 0.55 (L) 06/08/2022   FREET4 0.81 01/12/2021   FREET4 0.76 06/18/2019   FREET4 0.83 03/27/2018  07/28/2013: TSH 1.69  Pt denies: - feeling nodules in neck - hoarseness - dysphagia - choking  She had right shoulder surgery in 07/2022.  Sugars improved dramatically before the surgery so I can clear her for the procedure.    ROS + see HPI  I reviewed pt's medications, allergies, PMH, social hx, family hx, and changes were documented in the history of present illness.  Otherwise, unchanged from my initial visit note.  Past Medical History:  Diagnosis Date   Anemia    Anxiety    Arthritis    Chest pain of uncertain etiology    Depression    Diabetes mellitus without complication (HCC)    Type 2   Dyspnea    Hyperlipidemia    Hypothyroidism    Palpitations    Pneumonia    Thyroid disease    Urticaria    Past Surgical History:  Procedure Laterality Date   CESAREAN SECTION     SHOULDER ARTHROSCOPY WITH ROTATOR CUFF REPAIR AND SUBACROMIAL DECOMPRESSION Right 07/25/2022   Procedure: Right shoulder arthroscopy, subacromial decompression, debridement, mini open rotator cuff repair;  Surgeon: Jene Every, MD;  Location: [facility];  Service: Orthopedics;  Laterality: Right;   UTERINE FIBROID EMBOLIZATION     WISDOM TOOTH EXTRACTION     Social History   Socioeconomic History   Marital status: Single    Spouse name: Not on file   Number of children: Not on file   Years of education: Not on file   Highest education level: Not on file  Occupational History   Not on file  Tobacco Use   Smoking status: Never   Smokeless tobacco: Never  Vaping Use   Vaping Use: Never used  Substance and Sexual Activity   Alcohol use: No    Alcohol/week: 0.0 standard drinks of alcohol   Drug use: Never   Sexual activity: Not on file  Other Topics Concern   Not on file  Social History Narrative   Not on file   Social Determinants of Health   Financial Resource Strain: Not on file  Food Insecurity: Not on file  Transportation Needs: Not on file  Physical Activity: Not on file  Stress: Not on file  Social Connections: Not on file  Intimate Partner Violence: Not on file   Current Outpatient Medications on File Prior to Visit  Medication Sig Dispense Refill   clobetasol (TEMOVATE) 0.05 % external solution Apply a small amount to scalp twice a day 25 mL 2   dapagliflozin propanediol (FARXIGA) 10 MG TABS tablet TAKE 1 TABLET BY MOUTH DAILY (Patient taking  differently: Take 10 mg by mouth daily.) 90 tablet 1   glucose blood (ONETOUCH VERIO) test strip Use as instructed to check blood sugar daily 100 each 12   ibuprofen (ADVIL) 800 MG tablet Take 1 tablet (800 mg total) by mouth every 8 (eight) hours as needed, for pain. Take with food. 60 tablet 0   insulin glargine-yfgn (SEMGLEE, YFGN,) 100 UNIT/ML Pen Inject 40 Units into the skin at bedtime. 30 mL 1   insulin lispro (HUMALOG KWIKPEN) 200 UNIT/ML KwikPen Inject 20-25 units into the skin before meals and  10-15 units before a snack. (  up to 100 units daily). 12 mL 0   Insulin Pen Needle (UNIFINE PENTIPS) 32G X 4 MM MISC Use 5 times a day 400 each 3   Lancets (FREESTYLE) lancets USE TO CHECK BLOOD SUGAR 4 TIMES DAILY 100 each 12   levothyroxine (SYNTHROID) 100 MCG tablet Take 1 tablet (100 mcg total) by mouth daily before breakfast. 90 tablet 1   metFORMIN (GLUCOPHAGE-XR) 500 MG 24 hr tablet Take 2 tablets (1,000 mg total) by mouth 2 (two) times daily with a meal. (Patient not taking: Reported on 02/13/2023) 360 tablet 3   OneTouch Delica Lancets 30G MISC Use to check blood sugar daily 100 each 11   rosuvastatin (CRESTOR) 5 MG tablet Take 1 tablet (5 mg total) by mouth daily. 90 tablet 3   Semaglutide, 1 MG/DOSE, (OZEMPIC, 1 MG/DOSE,) 4 MG/3ML SOPN Inject 1 mg into the skin once a week. 9 mL 3   sertraline (ZOLOFT) 100 MG tablet Take 1 tablet (100 mg total) by mouth daily. 90 tablet 1   triamcinolone (NASACORT ALLERGY 24HR) 55 MCG/ACT AERO nasal inhaler Place 2 sprays into the nose daily as needed. 50.7 g 3   VITAMIN D, ERGOCALCIFEROL, PO Take 5,000 Units by mouth daily.     [DISCONTINUED] loratadine (CLARITIN) 10 MG tablet TAKE 1 TABLET BY MOUTH ONCE A DAY (Patient taking differently: Take 10 mg by mouth daily as needed for allergies.) 90 tablet 3   Current Facility-Administered Medications on File Prior to Visit  Medication Dose Route Frequency Provider Last Rate Last Admin   methocarbamol (ROBAXIN)  tablet 500 mg  500 mg Oral TID Jene Every, MD       Allergies  Allergen Reactions   Bactrim [Sulfamethoxazole-Trimethoprim] Other (See Comments)    Appears to be causing DRESS   Bupropion Hives   Augmentin [Amoxicillin-Pot Clavulanate] Itching   Cyclobenzaprine Other (See Comments)    Excessive sleepiness   Lexapro [Escitalopram] Diarrhea and Nausea Only   Pravastatin Other (See Comments)    Leg cramps/ mylagia   Atorvastatin Other (See Comments)    Leg cramps   Doxepin Hcl Other (See Comments)    Nightmares    Family History  Problem Relation Age of Onset   Dementia Mother    Diabetes Mother    Stroke Father    Colon polyps Neg Hx    Colon cancer Neg Hx    Esophageal cancer Neg Hx    Stomach cancer Neg Hx    Rectal cancer Neg Hx    PE: BP 120/80   Pulse 94   Ht 5\' 5"  (1.651 m)   Wt 186 lb (84.4 kg)   LMP 07/10/2012   SpO2 97%   BMI 30.95 kg/m    Wt Readings from Last 3 Encounters:  02/28/23 186 lb (84.4 kg)  02/13/23 183 lb (83 kg)  10/12/22 185 lb 12.8 oz (84.3 kg)   Constitutional: overweight, in NAD Eyes:  EOMI, no exophthalmos ENT: no neck masses, no cervical lymphadenopathy Cardiovascular: RRR, No MRG Respiratory: CTA B Musculoskeletal: no deformities Skin:no rashes Neurological: no tremor with outstretched hands  ASSESSMENT: 1. DM2, insulin-dependent, uncontrolled, with complications - DR  No family history of medullary thyroid cancer or personal history of pancreatitis.  2. Hypothyroidism - postablative for Graves ds.  3. HL  PLAN:  1.  Patient with history of uncontrolled type 2 diabetes, with spectacular improvement in her diabetes control when she eliminated sweet drinks.  At that time, her HbA1c decreased from 11.6% to 6.9%  and she also lost almost 20 pounds.  However, afterwards she restarted drinking sodas.  At last visit, strongly advised her to stop.  At that time, HbA1c was 7.8%, higher. CGM interpretation: -At today's visit, we  reviewed her CGM downloads: It appears that 71% of values are in target range (goal >70%), while 29% are higher than 180 (goal <25%), and 0% are lower than 70 (goal <4%).  The calculated average blood sugar is 162.  The projected HbA1c for the next 3 months (GMI) is 7.2%. -Reviewing the CGM trends, sugars appear to be fluctuating within the target range with the exception of higher numbers after breakfast and dinner.  However, they appear to be much improved in the last 2 weeks compared to the previous 2 weeks, likely related to her stopping sodas.  I strongly advised her to continue.  She is also seen in the weight management clinic and I am sure that her sugars continue to improve as she continues with the program.  Therefore, I did not suggest a change in her regimen for now.  She does tell me that she stopped metformin since last visit due to GI symptoms (nausea), but will continue Farxiga, her basal/bolus insulin regimen and also the GLP-1 receptor agonist. - I suggested to:  Patient Instructions  Please use the following regimen: - Farxiga 10 mg before b'fast - Humalog 25 units 15 min before every meal - Semglee 40 units daily - Ozempic 1 mg weekly  Please continue Levothyroxine 100 mcg daily.  Take the thyroid hormone every day, with water, at least 30 minutes before breakfast, separated by at least 4 hours from: - acid reflux medications - calcium - iron - multivitamins  Please return in 4 months.   - we checked her HbA1c: 7.9% (higher) - advised to check sugars at different times of the day - 4x a day, rotating check times - advised for yearly eye exams >> she is UTD - return to clinic in 4 months  2. Hypothyroidism - latest thyroid labs reviewed with pt. >> normal: Lab Results  Component Value Date   TSH 2.15 07/02/2022  - she continues on LT4 100 mcg daily - pt feels good on this dose. - we discussed about taking the thyroid hormone every day, with water, >30 minutes before  breakfast, separated by >4 hours from acid reflux medications, calcium, iron, multivitamins. Pt. is taking it correctly.  3. HL  - Reviewed latest lipid panel from 07/04/2022: LDL was elevated, but much improved: 150/128/37/93 - Continues Crestor 5 mg daily without side effects.  Carlus Pavlov, MD PhD [facility]

## 2023-02-28 NOTE — Patient Instructions (Signed)
Please use the following regimen: - Farxiga 10 mg before b'fast - Humalog 25 units 15 min before every meal - Semglee 40 units daily - Ozempic 1 mg weekly  Please continue Levothyroxine 100 mcg daily.  Take the thyroid hormone every day, with water, at least 30 minutes before breakfast, separated by at least 4 hours from: - acid reflux medications - calcium - iron - multivitamins  Please return in 4 months.

## 2023-03-05 ENCOUNTER — Encounter (INDEPENDENT_AMBULATORY_CARE_PROVIDER_SITE_OTHER): Payer: Self-pay | Admitting: Family Medicine

## 2023-03-05 ENCOUNTER — Ambulatory Visit (INDEPENDENT_AMBULATORY_CARE_PROVIDER_SITE_OTHER): Payer: 59 | Admitting: Family Medicine

## 2023-03-05 VITALS — BP 121/77 | HR 94 | Temp 98.5°F | Ht 65.0 in | Wt 183.0 lb

## 2023-03-05 DIAGNOSIS — Z6830 Body mass index (BMI) 30.0-30.9, adult: Secondary | ICD-10-CM

## 2023-03-05 DIAGNOSIS — E1169 Type 2 diabetes mellitus with other specified complication: Secondary | ICD-10-CM | POA: Diagnosis not present

## 2023-03-05 DIAGNOSIS — E669 Obesity, unspecified: Secondary | ICD-10-CM | POA: Diagnosis not present

## 2023-03-05 DIAGNOSIS — E559 Vitamin D deficiency, unspecified: Secondary | ICD-10-CM

## 2023-03-05 DIAGNOSIS — E785 Hyperlipidemia, unspecified: Secondary | ICD-10-CM | POA: Diagnosis not present

## 2023-03-05 DIAGNOSIS — F32A Depression, unspecified: Secondary | ICD-10-CM | POA: Diagnosis not present

## 2023-03-05 DIAGNOSIS — R0602 Shortness of breath: Secondary | ICD-10-CM

## 2023-03-05 DIAGNOSIS — E1165 Type 2 diabetes mellitus with hyperglycemia: Secondary | ICD-10-CM

## 2023-03-05 DIAGNOSIS — E890 Postprocedural hypothyroidism: Secondary | ICD-10-CM | POA: Diagnosis not present

## 2023-03-05 DIAGNOSIS — R5383 Other fatigue: Secondary | ICD-10-CM | POA: Diagnosis not present

## 2023-03-05 DIAGNOSIS — Z7985 Long-term (current) use of injectable non-insulin antidiabetic drugs: Secondary | ICD-10-CM

## 2023-03-05 DIAGNOSIS — Z794 Long term (current) use of insulin: Secondary | ICD-10-CM | POA: Diagnosis not present

## 2023-03-05 NOTE — Progress Notes (Signed)
Chief Complaint:   OBESITY Samantha [name] (MR# [id number]) is a 57 y.o. female who presents for evaluation and treatment of obesity and related comorbidities. Current BMI is Body mass index is 30.45 kg/m. Samantha [name] has been struggling with her weight for many years and has been unsuccessful in either losing weight, maintaining weight loss, or reaching her healthy weight goal.  Samantha [name] is currently in the action stage of change and ready to dedicate time achieving and maintaining a healthier weight. Samantha [name] is interested in becoming our patient and working on intensive lifestyle modifications including (but not limited to) diet and exercise for weight loss.  Returning patient from nutritional plan appointment.  She has a history of pre eclampsia in her pregnancy but daughter was born and was over 10lbs.  Recent rotator cuff repair in October of 2023.  Works for [employer] in [employer] for 40 hours a week at home. Lives alone. Desired weight is 160- last time she was that weight was prior to her pregnancy. Tries to be mindful of food choices. Still drinks juice, fruit punch, lemonade and sweet tea. Eats out 2-3 times a week at [employer] or [employer].  Her air conditioning is out so she uses microwave and tries to stick away from stove and dryer. Skips meals occasionally and not consistently the same meal.   Food Recall: May go to the kitchen and get a sandwich with juice and chips.  Sandwich with ham or [place] (2 slices) and mayonnaise (1tsp), 1 slice of cheese. Will do about 10oz of juice (Minute Maid Fruit Punch), Cheez Its small bag. May have a cookie afterward.  Feels full after this.  Next time she eats would be perhaps a Healthy Choice meal with water.  May eat another microwave meal or make another sandwich depending on what she has in the house.   If she skips breakfast but then eats Healthy Choice spaghetti she may want something else. She does take a 2-3 hour nap after  work daily which plays into her 6 hours of sleep at night.   Samantha [name]'s habits were reviewed today and are as follows: her desired weight loss is 23 lbs, she started gaining weight with working in [employer], her heaviest weight ever was 205 pounds, she has significant food cravings issues, she snacks frequently in the evenings, she skips meals frequently, she is frequently drinking liquids with calories, she frequently makes poor food choices, she has problems with excessive hunger, she frequently eats larger portions than normal, and she struggles with emotional eating.  Depression Screen Samantha [name]'s Food and Mood (modified PHQ-9) score was 7.  Subjective:   1. Other fatigue [name] admits to daytime somnolence and admits to waking up still tired. Patient has a history of symptoms of daytime fatigue and morning fatigue. Samantha [name] generally gets 6 hours of sleep per night, and states that she has nightime awakenings and generally restful sleep. Snoring is present. Apneic episodes are present. Epworth Sleepiness Score is 11.  EKG-normal sinus rhythm at 85 bpm with poor R wave progression.  2. SOBOE (shortness of breath on exertion) Samantha [name] notes increasing shortness of breath with exercising and seems to be worsening over time with weight gain. She notes getting out of breath sooner with activity than she used to. This has not gotten worse recently. Samantha [name] denies shortness of breath at rest or orthopnea.  3. Type 2 diabetes mellitus with hyperglycemia, with long-term current use of insulin (HCC) Dexcom 7-71% in  range.  Patient sees Dr. Elvera Lennox.  She is on [place], Humalog, Ozempic, and [place].  She was diagnosed in 2000 07/2011.  Normal fasting blood sugars 130.  She is taking Semglee 1 time per day.  4. Vitamin D deficiency Patient is on vitamin D.  No vitamin D level in epic.  She notes fatigue and her last vitamin D level was 56.4 on 01/16/2023.  5. Hypothyroidism, postablative Patient  is on levothyroxine 100 mcg daily.  Last TSH has valued.  6. Hyperlipidemia associated with type 2 diabetes mellitus (HCC) Patient is on rosuvastatin daily.  She reports that she had labs done with her PCP at her physical in May.  7. Anxiety and depression Patient is on Zoloft 100 mg daily.  Assessment/Plan:   1. Other fatigue Samantha [name] does feel that her weight is causing her energy to be lower than it should be. Fatigue may be related to obesity, depression or many other causes. Labs will be ordered, and in the meanwhile, [name] will focus on self care including making healthy food choices, increasing physical activity and focusing on stress reduction.  - EKG 12-Lead  2. SOBOE (shortness of breath on exertion) [name] does feel that she gets out of breath more easily that she used to when she exercises. Samantha [name]'s shortness of breath appears to be obesity related and exercise induced. She has agreed to work on weight loss and gradually increase exercise to treat her exercise induced shortness of breath. Will continue to monitor closely.  - CBC with Differential/Platelet - Folate  3. Type 2 diabetes mellitus with hyperglycemia, with long-term current use of insulin (HCC) We will check labs today, and we will follow-up at patient's next appointment.  - Vitamin B12 - Comprehensive metabolic panel - Hemoglobin A1c - C-peptide  4. Vitamin D deficiency We will repeat vitamin D level in 3 months.  5. Hypothyroidism, postablative We will check labs today, and we will follow-up at patient's next appointment.  - T4, free - T3 - TSH  6. Hyperlipidemia associated with type 2 diabetes mellitus (HCC) We will check labs today, and we will follow-up at patient's next appointment.  - Lipid Panel With LDL/HDL Ratio  7. Anxiety and depression We will follow-up on symptoms at patient's next appointment.  8. BMI 30.0-30.9,adult  9. Obesity with starting BMI of 30.4 Samantha [name] is  currently in the action stage of change and her goal is to continue with weight loss efforts. I recommend [name] begin the structured treatment plan as follows:  She has agreed to the Category 1 Plan.  Exercise goals: No exercise has been prescribed at this time.   Behavioral modification strategies: increasing lean protein intake, meal planning and cooking strategies, keeping healthy foods in the home, and planning for success.  She was informed of the importance of frequent follow-up visits to maximize her success with intensive lifestyle modifications for her multiple health conditions. She was informed we would discuss her lab results at her next visit unless there is a critical issue that needs to be addressed sooner. Samantha [name] agreed to keep her next visit at the agreed upon time to discuss these results.  Objective:   Blood pressure 121/77, pulse 94, temperature 98.5 F (36.9 C), height 5\' 5"  (1.651 m), weight 183 lb (83 kg), last menstrual period 07/10/2012, SpO2 98 %. Body mass index is 30.45 kg/m.  EKG: Normal sinus rhythm, rate 85 BPM.  Indirect Calorimeter completed today shows a VO2 of (unknown) and a REE of (unknown).  Her calculated basal metabolic rate is 1610 thus her basal metabolic rate is  (unable to determine)  than expected.  General: Cooperative, alert, well developed, in no acute distress. HEENT: Conjunctivae and lids unremarkable. Cardiovascular: Regular rhythm.  Lungs: Normal work of breathing. Neurologic: No focal deficits.   Lab Results  Component Value Date   CREATININE 0.82 03/05/2023   BUN 17 03/05/2023   NA 142 03/05/2023   K 4.3 03/05/2023   CL 104 03/05/2023   CO2 25 03/05/2023   Lab Results  Component Value Date   ALT 17 03/05/2023   AST 12 03/05/2023   ALKPHOS 121 03/05/2023   BILITOT 0.2 03/05/2023   Lab Results  Component Value Date   HGBA1C 8.1 (H) 03/05/2023   HGBA1C 7.8 (A) 10/12/2022   HGBA1C 7.7 (A) 06/08/2022   HGBA1C 8.5  (A) 11/09/2021   HGBA1C 6.9 (H) 04/03/2021   No results found for: "INSULIN" Lab Results  Component Value Date   TSH 1.020 03/05/2023   Lab Results  Component Value Date   CHOL 169 03/05/2023   HDL 42 03/05/2023   LDLCALC 104 (H) 03/05/2023   TRIG 131 03/05/2023   CHOLHDL 4 12/25/2017   Lab Results  Component Value Date   WBC 9.4 03/05/2023   HGB 13.2 03/05/2023   HCT 39.4 03/05/2023   MCV 82 03/05/2023   PLT 363 03/05/2023   Lab Results  Component Value Date   FERRITIN 272 01/24/2020   Attestation Statements:   Reviewed by clinician on day of visit: allergies, medications, problem list, medical history, surgical history, family history, social history, and previous encounter notes.  Time spent on visit including pre-visit chart review and post-visit charting and care was 45 minutes.   I, Burt Knack, am acting as transcriptionist for Reuben Likes, MD.  This is the patient's first visit at [facility]. The patient's NEW PATIENT PACKET was reviewed at length. Included in the packet: current and past health history, medications, allergies, ROS, gynecologic history (women only), surgical history, family history, social history, weight history, weight loss surgery history (for those that have had weight loss surgery), nutritional evaluation, mood and food questionnaire, PHQ9, Epworth questionnaire, sleep habits questionnaire, patient life and health improvement goals questionnaire. These will all be scanned into the patient's chart under media.   During the visit, I independently reviewed the patient's EKG, bioimpedance scale results, and indirect calorimeter results. I used this information to tailor a meal plan for the patient that will help her to lose weight and will improve her obesity-related conditions going forward. I performed a medically necessary appropriate examination and/or evaluation. I discussed the assessment and treatment plan with the  patient. The patient was provided an opportunity to ask questions and all were answered. The patient agreed with the plan and demonstrated an understanding of the instructions. Labs were ordered at this visit and will be reviewed at the next visit unless more critical results need to be addressed immediately. Clinical information was updated and documented in the EMR.   I have reviewed the above documentation for accuracy and completeness, and I agree with the above. - Reuben Likes, MD

## 2023-03-06 ENCOUNTER — Other Ambulatory Visit (HOSPITAL_COMMUNITY): Payer: Self-pay

## 2023-03-06 LAB — COMPREHENSIVE METABOLIC PANEL
ALT: 17 IU/L (ref 0–32)
AST: 12 IU/L (ref 0–40)
Albumin: 4.4 g/dL (ref 3.8–4.9)
Alkaline Phosphatase: 121 IU/L (ref 44–121)
BUN/Creatinine Ratio: 21 (ref 9–23)
BUN: 17 mg/dL (ref 6–24)
Bilirubin Total: 0.2 mg/dL (ref 0.0–1.2)
CO2: 25 mmol/L (ref 20–29)
Calcium: 9.4 mg/dL (ref 8.7–10.2)
Chloride: 104 mmol/L (ref 96–106)
Creatinine, Ser: 0.82 mg/dL (ref 0.57–1.00)
Globulin, Total: 2.3 g/dL (ref 1.5–4.5)
Glucose: 89 mg/dL (ref 70–99)
Potassium: 4.3 mmol/L (ref 3.5–5.2)
Sodium: 142 mmol/L (ref 134–144)
Total Protein: 6.7 g/dL (ref 6.0–8.5)
eGFR: 83 mL/min/{1.73_m2} (ref >59)

## 2023-03-06 LAB — CBC WITH DIFFERENTIAL/PLATELET
Basophils Absolute: 0 10*3/uL (ref 0.0–0.2)
Basos: 0 %
EOS (ABSOLUTE): 0.2 10*3/uL (ref 0.0–0.4)
Eos: 2 %
Hematocrit: 39.4 % (ref 34.0–46.6)
Hemoglobin: 13.2 g/dL (ref 11.1–15.9)
Immature Grans (Abs): 0 10*3/uL (ref 0.0–0.1)
Immature Granulocytes: 0 %
Lymphocytes Absolute: 3.1 10*3/uL (ref 0.7–3.1)
Lymphs: 33 %
MCH: 27.5 pg (ref 26.6–33.0)
MCHC: 33.5 g/dL (ref 31.5–35.7)
MCV: 82 fL (ref 79–97)
Monocytes Absolute: 0.7 10*3/uL (ref 0.1–0.9)
Monocytes: 8 %
Neutrophils Absolute: 5.3 10*3/uL (ref 1.4–7.0)
Neutrophils: 57 %
Platelets: 363 10*3/uL (ref 150–450)
RBC: 4.8 x10E6/uL (ref 3.77–5.28)
RDW: 13.2 % (ref 11.7–15.4)
WBC: 9.4 10*3/uL (ref 3.4–10.8)

## 2023-03-06 LAB — LIPID PANEL WITH LDL/HDL RATIO
Cholesterol, Total: 169 mg/dL (ref 100–199)
HDL: 42 mg/dL (ref >39)
LDL Chol Calc (NIH): 104 mg/dL — ABNORMAL HIGH (ref 0–99)
LDL/HDL Ratio: 2.5 ratio (ref 0.0–3.2)
Triglycerides: 131 mg/dL (ref 0–149)
VLDL Cholesterol Cal: 23 mg/dL (ref 5–40)

## 2023-03-06 LAB — VITAMIN B12: Vitamin B-12: 399 pg/mL (ref 232–1245)

## 2023-03-06 LAB — C-PEPTIDE: C-Peptide: 2.9 ng/mL (ref 1.1–4.4)

## 2023-03-06 LAB — HEMOGLOBIN A1C
Est. average glucose Bld gHb Est-mCnc: 186 mg/dL
Hgb A1c MFr Bld: 8.1 % — ABNORMAL HIGH (ref 4.8–5.6)

## 2023-03-06 LAB — FOLATE: Folate: 5.2 ng/mL (ref >3.0)

## 2023-03-06 LAB — T3: T3, Total: 95 ng/dL (ref 71–180)

## 2023-03-06 LAB — T4, FREE: Free T4: 1.2 ng/dL (ref 0.82–1.77)

## 2023-03-06 LAB — TSH: TSH: 1.02 u[IU]/mL (ref 0.450–4.500)

## 2023-03-11 ENCOUNTER — Telehealth: Payer: Self-pay | Admitting: Pulmonary Disease

## 2023-03-11 NOTE — Telephone Encounter (Signed)
Called patient but she did not answer. Left message for patient to call back. Patient last seen on 05/03/22. I sent an epic chat to Maralyn Sago to see if we could use one of her procedure spots for an acute visit tomorrow. Will update once I hear back.

## 2023-03-11 NOTE — Telephone Encounter (Signed)
Called and spoke with patient. She stated that she has had a productive cough for the past few months. Productive cough with clear phlegm. Denied any fevers, body aches or chills. Also denied being around anyone who has been sick recently.   I offered her 2 appts for tomorrow morning but she stated that it was short notice and that she needed time to notify her employer. I was able to get her scheduled to see Samantha [name] on 7/5.   Nothing further needed at time of call.

## 2023-03-11 NOTE — Telephone Encounter (Signed)
Pt. Having bad cough needs med advise no apts. Available at this time this week

## 2023-03-15 ENCOUNTER — Other Ambulatory Visit: Payer: Self-pay

## 2023-03-15 ENCOUNTER — Ambulatory Visit: Payer: 59 | Admitting: Acute Care

## 2023-03-15 ENCOUNTER — Other Ambulatory Visit: Payer: Self-pay | Admitting: Internal Medicine

## 2023-03-15 ENCOUNTER — Encounter: Payer: Self-pay | Admitting: Acute Care

## 2023-03-15 ENCOUNTER — Other Ambulatory Visit (HOSPITAL_COMMUNITY): Payer: Self-pay

## 2023-03-15 VITALS — BP 120/86 | HR 101 | Temp 98.3°F | Ht 65.0 in | Wt 183.0 lb

## 2023-03-15 DIAGNOSIS — E1165 Type 2 diabetes mellitus with hyperglycemia: Secondary | ICD-10-CM | POA: Diagnosis not present

## 2023-03-15 DIAGNOSIS — R0609 Other forms of dyspnea: Secondary | ICD-10-CM | POA: Diagnosis not present

## 2023-03-15 DIAGNOSIS — R051 Acute cough: Secondary | ICD-10-CM

## 2023-03-15 LAB — POCT EXHALED NITRIC OXIDE: FeNO level (ppb): 13

## 2023-03-15 MED ORDER — FLUTICASONE-SALMETEROL 100-50 MCG/ACT IN AEPB
1.0000 | INHALATION_SPRAY | Freq: Two times a day (BID) | RESPIRATORY_TRACT | 6 refills | Status: DC
  Filled 2023-03-15: qty 60, 30d supply, fill #0
  Filled 2023-05-17: qty 60, 30d supply, fill #1
  Filled 2023-07-17: qty 60, 30d supply, fill #2

## 2023-03-15 MED ORDER — HUMALOG KWIKPEN 200 UNIT/ML ~~LOC~~ SOPN
20.0000 [IU] | PEN_INJECTOR | Freq: Three times a day (TID) | SUBCUTANEOUS | 0 refills | Status: DC
  Filled 2023-03-15 – 2023-03-26 (×2): qty 12, 28d supply, fill #0

## 2023-03-15 MED ORDER — OMEPRAZOLE 20 MG PO CPDR
20.0000 mg | DELAYED_RELEASE_CAPSULE | Freq: Every day | ORAL | 11 refills | Status: DC
  Filled 2023-03-15: qty 30, 30d supply, fill #0
  Filled 2023-04-19: qty 30, 30d supply, fill #1
  Filled 2023-05-17: qty 30, 30d supply, fill #2
  Filled 2023-07-17: qty 30, 30d supply, fill #3

## 2023-03-15 NOTE — Progress Notes (Signed)
History of Present Illness Samantha [name] is a 57 y.o. female never smoker with acute  cough x 2 months  and shortness of breath. She is followed by Samantha [name].   03/15/2023 Pt. Presents for cough and dyspnea  x 2 months. She has been  staying inside. She is unsure if this is related to the hot weather. She has been using her Flovent as a rescue inhaler, not as a maintenance inhaler. She does not feel she has any post nasal drip. Cough is worse at night .  She has clear secretions , more than her baseline.She does not feel specific foods trigger her cough. She does feel she has reflux .She is not taking anything for this, but says she has in the past. She is not on an ACE inhibitor.  She states drinking water does help her cough. She is using Halls Cough drops with menthol for her cough. I have asked her to stop using anything over the counter with menthol.   Test Results: Sleep Study 11/2021, AHI of less than 5 Echo 2021>> normal left and right cardiac size and pressures  PFT 04/2022 >>> Mild restriction, Minimal diffusion deficit.               Latest Ref Rng & Units 03/05/2023    9:05 AM 07/18/2022    2:45 PM 04/03/2021    3:49 AM  CBC  WBC 3.4 - 10.8 x10E3/uL 9.4  7.9  12.8   Hemoglobin 11.1 - 15.9 g/dL 16.1  09.6  04.5   Hematocrit 34.0 - 46.6 % 39.4  41.1  35.6   Platelets 150 - 450 x10E3/uL 363  406  219        Latest Ref Rng & Units 03/05/2023    9:05 AM 07/18/2022    2:45 PM 04/03/2021    3:49 AM  BMP  Glucose 70 - 99 mg/dL 89  409  811   BUN 6 - 24 mg/dL 17  12  15    Creatinine 0.57 - 1.00 mg/dL 9.14  7.82  9.56   BUN/Creat Ratio 9 - 23 21     Sodium 134 - 144 mmol/L 142  144  137   Potassium 3.5 - 5.2 mmol/L 4.3  4.1  3.2   Chloride 96 - 106 mmol/L 104  108  104   CO2 20 - 29 mmol/L 25  26  22    Calcium 8.7 - 10.2 mg/dL 9.4  9.3  8.1     BNP No results found for: "BNP"  ProBNP No results found for: "PROBNP"  PFT    Component Value Date/Time    FEV1PRE 1.85 05/03/2022 1302   FEV1POST 1.76 05/03/2022 1302   FVCPRE 2.22 05/03/2022 1302   FVCPOST 2.15 05/03/2022 1302   TLC 4.05 05/03/2022 1302   DLCOUNC 16.55 05/03/2022 1302   PREFEV1FVCRT 83 05/03/2022 1302   PSTFEV1FVCRT 82 05/03/2022 1302    No results found.   Past medical hx Past Medical History:  Diagnosis Date   Anemia    Anxiety    Arthritis    Chest pain of uncertain etiology    Depression    Diabetes mellitus without complication (HCC)    Type 2   Dyspnea    Hyperlipidemia    Hypothyroidism    Palpitations    Pneumonia    Thyroid disease    Urticaria    Vitamin D deficiency      Social History   Tobacco Use  Smoking status: Never   Smokeless tobacco: Never  Vaping Use   Vaping Use: Never used  Substance Use Topics   Alcohol use: No    Alcohol/week: 0.0 standard drinks of alcohol   Drug use: Never    [name] reports that she has never smoked. She has never used smokeless tobacco. She reports that she does not drink alcohol and does not use drugs.  Tobacco Cessation: Never smoker    Past surgical hx, Family hx, Social hx all reviewed.  Current Outpatient Medications on File Prior to Visit  Medication Sig   clobetasol (TEMOVATE) 0.05 % external solution Apply a small amount to scalp twice a day   dapagliflozin propanediol (FARXIGA) 10 MG TABS tablet Take by mouth daily.   ibuprofen (ADVIL) 800 MG tablet Take 1 tablet (800 mg total) by mouth every 8 (eight) hours as needed, for pain. Take with food.   insulin glargine-yfgn (SEMGLEE, YFGN,) 100 UNIT/ML Pen Inject 40 Units into the skin at bedtime.   Insulin Pen Needle (UNIFINE PENTIPS) 32G X 4 MM MISC Use 5 times a day   levothyroxine (SYNTHROID) 100 MCG tablet Take 1 tablet (100 mcg total) by mouth daily before breakfast.   rosuvastatin (CRESTOR) 5 MG tablet Take 1 tablet (5 mg total) by mouth daily.   Semaglutide, 1 MG/DOSE, (OZEMPIC, 1 MG/DOSE,) 4 MG/3ML SOPN Inject 1 mg into the skin  once a week.   sertraline (ZOLOFT) 100 MG tablet Take 1 tablet (100 mg total) by mouth daily.   Vitamin D, Ergocalciferol, (DRISDOL) 1.25 MG (50000 UNIT) CAPS capsule Take 5,000 Units by mouth daily.   [DISCONTINUED] loratadine (CLARITIN) 10 MG tablet TAKE 1 TABLET BY MOUTH ONCE A DAY (Patient taking differently: Take 10 mg by mouth daily as needed for allergies.)   Current Facility-Administered Medications on File Prior to Visit  Medication   methocarbamol (ROBAXIN) tablet 500 mg     Allergies  Allergen Reactions   Bactrim [Sulfamethoxazole-Trimethoprim] Other (See Comments)    Appears to be causing DRESS   Bupropion Hives   Augmentin [Amoxicillin-Pot Clavulanate] Itching   Cyclobenzaprine Other (See Comments)    Excessive sleepiness   Lexapro [Escitalopram] Diarrhea and Nausea Only   Pravastatin Other (See Comments)    Leg cramps/ mylagia   Atorvastatin Other (See Comments)    Leg cramps   Doxepin Hcl Other (See Comments)    Nightmares     Review Of Systems:  Constitutional:   No  weight loss, night sweats,  Fevers, chills, fatigue, or  lassitude.  HEENT:   No headaches,  Difficulty swallowing,  Tooth/dental problems, or  Sore throat,                No sneezing, itching, ear ache, nasal congestion,+ post nasal drip, +Sore glands  CV:  No chest pain,  Orthopnea, PND, swelling in lower extremities, anasarca, dizziness, palpitations, syncope.   GI  No heartburn, indigestion, abdominal pain, nausea, vomiting, diarrhea, change in bowel habits, loss of appetite, bloody stools.   Resp: + shortness of breath with exertion not  at rest.  + excess mucus, + productive cough,  + non-productive cough,  No coughing up of blood.  No change in color of mucus.  No wheezing.  No chest wall deformity  Skin: no rash or lesions.  GU: no dysuria, change in color of urine, no urgency or frequency.  No flank pain, no hematuria   MS:  No joint pain or swelling.  No decreased range of  motion.   No back pain.  Psych:  No change in mood or affect. No depression or anxiety.  No memory loss.   Vital Signs BP 120/86 (BP Location: Left Arm, Patient Position: Sitting, Cuff Size: Normal)   Pulse (!) 101   Temp 98.3 F (36.8 C) (Oral)   Ht 5\' 5"  (1.651 m)   Wt 183 lb (83 kg)   LMP 07/10/2012   SpO2 98%   BMI 30.45 kg/m    Physical Exam:  General- No distress,  A&Ox3, pleasant ENT: No sinus tenderness, TM clear, pale nasal mucosa, no oral exudate,no post nasal drip, + LAN Cardiac: S1, S2, regular rate and rhythm, no murmur Chest: No wheeze/ rales/ dullness; no accessory muscle use, no nasal flaring, no sternal retractions, slightly diminished per bases Abd.: Soft Non-tender, ND, BS +, Body mass index is 30.45 kg/m.  Ext: No clubbing cyanosis, edema Neuro:  normal strength, MAE x 4, A&O x 3 Skin: No rashes, warm and dry, no lesions  Psych: normal mood and behavior   Assessment/Plan  Acute Cough over the past 2 months ? If reflux related Not using maintenance inhaler Plan We will get you started on Wixela inhaler Use 1 puff twice daily Rinse mouth after use. Sips of water instead of throat clearing Sugar Free Coca-Cola or Werther's originals for throat soothing. Delsym Cough syrup 5 cc's every 12 hours Non-sedating antihistamine of your choice daily ( Zyrtec, Allegra, Xyzol, Claritin ( Generic ok)  Start back on your omeprazole for reflux. Avoid menthol in cough drops.  Follow up in 1 month with Maralyn Sago NP or Samantha [name]  to see if cough is better.  Please contact office for sooner follow up if symptoms do not improve or worsen or seek emergency care    If initial interventions do not work and she continues to have swollen glands, we will get chest  imaging  I spent 30 minutes dedicated to the care of this patient on the date of this encounter to include pre-visit review of records, face-to-face time with the patient discussing conditions above, post visit  ordering of testing, clinical documentation with the electronic health record, making appropriate referrals as documented, and communicating necessary information to the patient's healthcare team.   Bevelyn Ngo, NP 03/15/2023  1:27 PM

## 2023-03-15 NOTE — Patient Instructions (Addendum)
It is good to see you today.  We will get you started on Wixela Use 1 puff twice daily Rinse mouth after use. Sips of water instead of throat clearing Sugar Free Coca-Cola or Werther's originals for throat soothing. Delsym Cough syrup 5 cc's every 12 hours Non-sedating antihistamine of your choice daily ( Zyrtec, Allegra, Xyzol, Claritin ( Generic ok)  Start back on your omeprazole for reflux. Avoid menthol in cough drops.  Follow up in 1 month with Maralyn Sago NP or Dr. Wynona Neat  to see if cough is better.  Please contact office for sooner follow up if symptoms do not improve or worsen or seek emergency care

## 2023-03-15 NOTE — Progress Notes (Signed)
Patient seen in the office today and instructed on use of Wixela.  Patient expressed understanding and demonstrated technique.

## 2023-03-19 ENCOUNTER — Encounter: Payer: Self-pay | Admitting: Pharmacist

## 2023-03-19 ENCOUNTER — Encounter (INDEPENDENT_AMBULATORY_CARE_PROVIDER_SITE_OTHER): Payer: Self-pay | Admitting: Family Medicine

## 2023-03-19 ENCOUNTER — Other Ambulatory Visit: Payer: Self-pay

## 2023-03-19 ENCOUNTER — Other Ambulatory Visit (HOSPITAL_COMMUNITY): Payer: Self-pay

## 2023-03-19 ENCOUNTER — Ambulatory Visit (INDEPENDENT_AMBULATORY_CARE_PROVIDER_SITE_OTHER): Payer: 59 | Admitting: Family Medicine

## 2023-03-19 ENCOUNTER — Telehealth (INDEPENDENT_AMBULATORY_CARE_PROVIDER_SITE_OTHER): Payer: 59 | Admitting: Family Medicine

## 2023-03-19 DIAGNOSIS — E038 Other specified hypothyroidism: Secondary | ICD-10-CM | POA: Diagnosis not present

## 2023-03-19 DIAGNOSIS — E1165 Type 2 diabetes mellitus with hyperglycemia: Secondary | ICD-10-CM | POA: Diagnosis not present

## 2023-03-19 DIAGNOSIS — E1169 Type 2 diabetes mellitus with other specified complication: Secondary | ICD-10-CM

## 2023-03-19 DIAGNOSIS — E785 Hyperlipidemia, unspecified: Secondary | ICD-10-CM | POA: Diagnosis not present

## 2023-03-19 DIAGNOSIS — R5383 Other fatigue: Secondary | ICD-10-CM | POA: Diagnosis not present

## 2023-03-19 DIAGNOSIS — Z6830 Body mass index (BMI) 30.0-30.9, adult: Secondary | ICD-10-CM | POA: Diagnosis not present

## 2023-03-19 DIAGNOSIS — R350 Frequency of micturition: Secondary | ICD-10-CM | POA: Diagnosis not present

## 2023-03-19 DIAGNOSIS — Z794 Long term (current) use of insulin: Secondary | ICD-10-CM | POA: Diagnosis not present

## 2023-03-19 DIAGNOSIS — E669 Obesity, unspecified: Secondary | ICD-10-CM

## 2023-03-19 NOTE — Progress Notes (Signed)
TeleHealth Visit:  Due to the COVID-19 pandemic, this visit was completed with telemedicine (audio/video) technology to reduce patient and provider exposure as well as to preserve personal protective equipment.   Samantha [name] has verbally consented to this TeleHealth visit. The patient is located at home, the provider is located at home. The participants in this visit include the listed provider and patient. The visit was conducted today via MyChart video.   Chief Complaint: OBESITY Samantha [name] is here to discuss her progress with her obesity treatment plan along with follow-up of her obesity related diagnoses. Samantha [name] is on the Category 1 Plan and states she is following her eating plan approximately 50% of the time. Samantha [name] states she is doing 0 minutes 0 times per week.  Today's visit was #: 2 Starting weight: 183 lbs Starting date: 03/05/2023  Interim History: First few weeks have been ok.  Her blood sugars are starting to come down and her weight is stable.  She is just starting to grasp the understanding of the meal.  Last few weeks she has not been per say following the Category 1- she is eating less, drinking more water, not drinking any soft drinks.  She is trying to alter her eating habits to eat more nutritiously. She is sticking around 1050 calories and isn't necessarily monitoring protein intake. She ate about a cup of rice with lunch and a cup with dinner and an open palm's worth of chicken for both meals.  No upcoming plans or scheduled activities for the next few weeks.  Subjective:   1. Type 2 diabetes mellitus with hyperglycemia, with long-term current use of insulin (HCC) Patient's blood sugar is trending down very few if any over 200.  Dexcom reviewed-8-day average 120, 7-day average 167, 14-day average 149, and 30-day average 154 with estimated A1c of 7.0.  She is taking 40 units of Semglee nightly.  C-peptide within normal limits.  2. Hyperlipidemia associated with type  2 diabetes mellitus (HCC) Patient's last LDL was > 100, and HDL below 50.  She is on rosuvastatin 5 mg daily with side effects noted of muscle aches.  3. Other specified hypothyroidism Patient is on levothyroxine 100 mcg daily.  Last TSH was within normal limits at 1.020.  She sees endocrinology.  Prior TSH was 31.90 in September 2023.  Assessment/Plan:   1. Type 2 diabetes mellitus with hyperglycemia, with long-term current use of insulin (HCC) Hypoglycemia protocol was discussed today.  Patient was instructed to slowly start decreasing Semglee 2 units if her fasting blood sugars are <100 for 2 or more days then wait until blood sugars stabilize before further decreasing.  2. Hyperlipidemia associated with type 2 diabetes mellitus (HCC) Patient will continue rosuvastatin, may need to discuss transition to nonstatin if patient continues to have myalgias.  3. Other specified hypothyroidism Patient will follow-up with Dr. Elvera Lennox for further management.   4. BMI 30.0-30.9,adult  5. Obesity with starting BMI of 30.4 Samantha [name] is currently in the action stage of change. As such, her goal is to continue with weight loss efforts. She has agreed to keeping a food journal and adhering to recommended goals of 1000-1100 calories and 75+ grams of protein daily.   Exercise goals: No exercise has been prescribed at this time.  Behavioral modification strategies: increasing lean protein intake, meal planning and cooking strategies, keeping healthy foods in the home, and planning for success.  Samantha [name] has agreed to follow-up with our clinic in 4 weeks. She was informed of the  importance of frequent follow-up visits to maximize her success with intensive lifestyle modifications for her multiple health conditions.  Objective:   VITALS: Per patient if applicable, see vitals. GENERAL: Alert and in no acute distress. CARDIOPULMONARY: No increased WOB. Speaking in clear sentences.  PSYCH: Pleasant and  cooperative. Speech normal rate and rhythm. Affect is appropriate. Insight and judgement are appropriate. Attention is focused, linear, and appropriate.  NEURO: Oriented as arrived to appointment on time with no prompting.   Lab Results  Component Value Date   CREATININE 0.82 03/05/2023   BUN 17 03/05/2023   NA 142 03/05/2023   K 4.3 03/05/2023   CL 104 03/05/2023   CO2 25 03/05/2023   Lab Results  Component Value Date   ALT 17 03/05/2023   AST 12 03/05/2023   ALKPHOS 121 03/05/2023   BILITOT 0.2 03/05/2023   Lab Results  Component Value Date   HGBA1C 8.1 (H) 03/05/2023   HGBA1C 7.8 (A) 10/12/2022   HGBA1C 7.7 (A) 06/08/2022   HGBA1C 8.5 (A) 11/09/2021   HGBA1C 6.9 (H) 04/03/2021   No results found for: "INSULIN" Lab Results  Component Value Date   TSH 1.020 03/05/2023   Lab Results  Component Value Date   CHOL 169 03/05/2023   HDL 42 03/05/2023   LDLCALC 104 (H) 03/05/2023   TRIG 131 03/05/2023   CHOLHDL 4 12/25/2017   No results found for: "VD25OH" Lab Results  Component Value Date   WBC 9.4 03/05/2023   HGB 13.2 03/05/2023   HCT 39.4 03/05/2023   MCV 82 03/05/2023   PLT 363 03/05/2023   Lab Results  Component Value Date   FERRITIN 272 01/24/2020    Attestation Statements:   Reviewed by clinician on day of visit: allergies, medications, problem list, medical history, surgical history, family history, social history, and previous encounter notes.   I, Burt Knack, am acting as transcriptionist for Reuben Likes, MD.  I have reviewed the above documentation for accuracy and completeness, and I agree with the above. - Reuben Likes, MD

## 2023-03-21 ENCOUNTER — Other Ambulatory Visit: Payer: Self-pay

## 2023-03-21 ENCOUNTER — Other Ambulatory Visit (HOSPITAL_COMMUNITY): Payer: Self-pay

## 2023-03-26 ENCOUNTER — Other Ambulatory Visit: Payer: Self-pay

## 2023-03-26 ENCOUNTER — Other Ambulatory Visit (HOSPITAL_COMMUNITY): Payer: Self-pay

## 2023-03-27 ENCOUNTER — Other Ambulatory Visit (HOSPITAL_COMMUNITY): Payer: Self-pay

## 2023-04-02 ENCOUNTER — Ambulatory Visit (INDEPENDENT_AMBULATORY_CARE_PROVIDER_SITE_OTHER): Payer: 59 | Admitting: Internal Medicine

## 2023-04-16 ENCOUNTER — Ambulatory Visit: Payer: 59 | Admitting: Acute Care

## 2023-04-16 ENCOUNTER — Ambulatory Visit (INDEPENDENT_AMBULATORY_CARE_PROVIDER_SITE_OTHER): Payer: 59 | Admitting: Family Medicine

## 2023-04-19 ENCOUNTER — Other Ambulatory Visit: Payer: Self-pay | Admitting: Internal Medicine

## 2023-04-19 ENCOUNTER — Other Ambulatory Visit (HOSPITAL_COMMUNITY): Payer: Self-pay

## 2023-04-19 ENCOUNTER — Other Ambulatory Visit: Payer: Self-pay

## 2023-04-19 MED ORDER — HUMALOG KWIKPEN 200 UNIT/ML ~~LOC~~ SOPN
20.0000 [IU] | PEN_INJECTOR | Freq: Three times a day (TID) | SUBCUTANEOUS | 0 refills | Status: DC
  Filled 2023-04-19: qty 12, 28d supply, fill #0

## 2023-04-22 ENCOUNTER — Other Ambulatory Visit (HOSPITAL_COMMUNITY): Payer: Self-pay

## 2023-04-24 NOTE — Addendum Note (Signed)
Addended by: Pollie Meyer on: 04/24/2023 04:27 PM   Modules accepted: Orders

## 2023-04-26 ENCOUNTER — Other Ambulatory Visit (HOSPITAL_COMMUNITY): Payer: Self-pay

## 2023-04-26 ENCOUNTER — Other Ambulatory Visit: Payer: Self-pay | Admitting: Internal Medicine

## 2023-04-26 MED ORDER — INSULIN GLARGINE-YFGN 100 UNIT/ML ~~LOC~~ SOPN
40.0000 [IU] | PEN_INJECTOR | Freq: Every day | SUBCUTANEOUS | 1 refills | Status: DC
  Filled 2023-04-26: qty 30, 75d supply, fill #0
  Filled 2023-07-17: qty 12, 30d supply, fill #1

## 2023-04-27 ENCOUNTER — Other Ambulatory Visit (HOSPITAL_COMMUNITY): Payer: Self-pay

## 2023-04-29 ENCOUNTER — Other Ambulatory Visit: Payer: Self-pay

## 2023-05-06 ENCOUNTER — Other Ambulatory Visit: Payer: Self-pay | Admitting: Internal Medicine

## 2023-05-06 ENCOUNTER — Other Ambulatory Visit (HOSPITAL_COMMUNITY): Payer: Self-pay

## 2023-05-06 ENCOUNTER — Other Ambulatory Visit: Payer: Self-pay

## 2023-05-06 MED ORDER — DAPAGLIFLOZIN PROPANEDIOL 10 MG PO TABS
10.0000 mg | ORAL_TABLET | Freq: Every day | ORAL | 1 refills | Status: DC
  Filled 2023-05-06: qty 90, 90d supply, fill #0
  Filled 2023-09-27: qty 90, 90d supply, fill #1

## 2023-05-07 ENCOUNTER — Other Ambulatory Visit (HOSPITAL_COMMUNITY): Payer: Self-pay

## 2023-05-17 ENCOUNTER — Other Ambulatory Visit: Payer: Self-pay

## 2023-05-17 ENCOUNTER — Other Ambulatory Visit (HOSPITAL_COMMUNITY): Payer: Self-pay

## 2023-05-19 ENCOUNTER — Emergency Department (HOSPITAL_COMMUNITY)
Admission: EM | Admit: 2023-05-19 | Discharge: 2023-05-19 | Disposition: A | Discharge status: Home / Self Care | Payer: 59 | Attending: Emergency Medicine | Admitting: Emergency Medicine

## 2023-05-19 ENCOUNTER — Emergency Department (HOSPITAL_COMMUNITY): Payer: 59

## 2023-05-19 ENCOUNTER — Other Ambulatory Visit: Payer: Self-pay

## 2023-05-19 ENCOUNTER — Other Ambulatory Visit (HOSPITAL_COMMUNITY): Payer: 59

## 2023-05-19 DIAGNOSIS — Y92410 Unspecified street and highway as the place of occurrence of the external cause: Secondary | ICD-10-CM | POA: Diagnosis not present

## 2023-05-19 DIAGNOSIS — M25469 Effusion, unspecified knee: Secondary | ICD-10-CM

## 2023-05-19 DIAGNOSIS — S3991XA Unspecified injury of abdomen, initial encounter: Secondary | ICD-10-CM | POA: Diagnosis not present

## 2023-05-19 DIAGNOSIS — M1811 Unilateral primary osteoarthritis of first carpometacarpal joint, right hand: Secondary | ICD-10-CM | POA: Diagnosis not present

## 2023-05-19 DIAGNOSIS — M50321 Other cervical disc degeneration at C4-C5 level: Secondary | ICD-10-CM | POA: Diagnosis not present

## 2023-05-19 DIAGNOSIS — K5730 Diverticulosis of large intestine without perforation or abscess without bleeding: Secondary | ICD-10-CM | POA: Diagnosis not present

## 2023-05-19 DIAGNOSIS — S199XXA Unspecified injury of neck, initial encounter: Secondary | ICD-10-CM | POA: Diagnosis not present

## 2023-05-19 DIAGNOSIS — S50811A Abrasion of right forearm, initial encounter: Secondary | ICD-10-CM | POA: Insufficient documentation

## 2023-05-19 DIAGNOSIS — S80811A Abrasion, right lower leg, initial encounter: Secondary | ICD-10-CM | POA: Insufficient documentation

## 2023-05-19 DIAGNOSIS — T07XXXA Unspecified multiple injuries, initial encounter: Secondary | ICD-10-CM

## 2023-05-19 DIAGNOSIS — M4801 Spinal stenosis, occipito-atlanto-axial region: Secondary | ICD-10-CM | POA: Diagnosis not present

## 2023-05-19 DIAGNOSIS — M25461 Effusion, right knee: Secondary | ICD-10-CM | POA: Diagnosis not present

## 2023-05-19 DIAGNOSIS — D259 Leiomyoma of uterus, unspecified: Secondary | ICD-10-CM | POA: Diagnosis not present

## 2023-05-19 DIAGNOSIS — S6991XA Unspecified injury of right wrist, hand and finger(s), initial encounter: Secondary | ICD-10-CM | POA: Diagnosis not present

## 2023-05-19 DIAGNOSIS — S59911A Unspecified injury of right forearm, initial encounter: Secondary | ICD-10-CM | POA: Diagnosis present

## 2023-05-19 DIAGNOSIS — Z794 Long term (current) use of insulin: Secondary | ICD-10-CM | POA: Diagnosis not present

## 2023-05-19 DIAGNOSIS — S8991XA Unspecified injury of right lower leg, initial encounter: Secondary | ICD-10-CM | POA: Diagnosis not present

## 2023-05-19 DIAGNOSIS — S80211A Abrasion, right knee, initial encounter: Secondary | ICD-10-CM | POA: Diagnosis not present

## 2023-05-19 LAB — I-STAT CHEM 8, ED
BUN: 15 mg/dL (ref 6–20)
Calcium, Ion: 1.2 mmol/L (ref 1.15–1.40)
Chloride: 110 mmol/L (ref 98–111)
Creatinine, Ser: 0.9 mg/dL (ref 0.44–1.00)
Glucose, Bld: 156 mg/dL — ABNORMAL HIGH (ref 70–99)
HCT: 41 % (ref 36.0–46.0)
Hemoglobin: 13.9 g/dL (ref 12.0–15.0)
Potassium: 3.7 mmol/L (ref 3.5–5.1)
Sodium: 144 mmol/L (ref 135–145)
TCO2: 24 mmol/L (ref 22–32)

## 2023-05-19 LAB — CBC WITH DIFFERENTIAL/PLATELET
Abs Immature Granulocytes: 0.03 10*3/uL (ref 0.00–0.07)
Basophils Absolute: 0 10*3/uL (ref 0.0–0.1)
Basophils Relative: 0 %
Eosinophils Absolute: 0.3 10*3/uL (ref 0.0–0.5)
Eosinophils Relative: 3 %
HCT: 40.9 % (ref 36.0–46.0)
Hemoglobin: 13.3 g/dL (ref 12.0–15.0)
Immature Granulocytes: 0 %
Lymphocytes Relative: 33 %
Lymphs Abs: 3.4 10*3/uL (ref 0.7–4.0)
MCH: 27.4 pg (ref 26.0–34.0)
MCHC: 32.5 g/dL (ref 30.0–36.0)
MCV: 84.2 fL (ref 80.0–100.0)
Monocytes Absolute: 0.7 10*3/uL (ref 0.1–1.0)
Monocytes Relative: 7 %
Neutro Abs: 5.8 10*3/uL (ref 1.7–7.7)
Neutrophils Relative %: 57 %
Platelets: 328 10*3/uL (ref 150–400)
RBC: 4.86 MIL/uL (ref 3.87–5.11)
RDW: 14 % (ref 11.5–15.5)
WBC: 10.3 10*3/uL (ref 4.0–10.5)
nRBC: 0 % (ref 0.0–0.2)

## 2023-05-19 MED ORDER — METHOCARBAMOL 500 MG PO TABS: 500.0000 mg | ORAL_TABLET | Freq: Two times a day (BID) | ORAL | 0 refills | Status: AC

## 2023-05-19 MED ORDER — TETANUS-DIPHTH-ACELL PERTUSSIS 5-2.5-18.5 LF-MCG/0.5 IM SUSY: 0.5000 mL | PREFILLED_SYRINGE | Freq: Once | INTRAMUSCULAR | Status: DC

## 2023-05-19 MED ORDER — IOHEXOL 350 MG/ML SOLN
75.0000 mL | Freq: Once | INTRAVENOUS | Status: AC | PRN
  Administered 2023-05-19: 75 mL via INTRAVENOUS

## 2023-05-19 MED ORDER — DICLOFENAC SODIUM ER 100 MG PO TB24: 100.0000 mg | ORAL_TABLET | Freq: Every day | ORAL | 0 refills | Status: DC

## 2023-05-19 MED ORDER — KETOROLAC TROMETHAMINE 30 MG/ML IJ SOLN
30.0000 mg | Freq: Once | INTRAMUSCULAR | Status: AC
  Administered 2023-05-19: 30 mg via INTRAVENOUS
  Filled 2023-05-19: qty 1

## 2023-05-19 MED ORDER — METHOCARBAMOL 500 MG PO TABS
500.0000 mg | ORAL_TABLET | Freq: Once | ORAL | Status: AC
  Administered 2023-05-19: 500 mg via ORAL
  Filled 2023-05-19: qty 1

## 2023-05-19 MED ORDER — FENTANYL CITRATE PF 50 MCG/ML IJ SOSY
50.0000 ug | PREFILLED_SYRINGE | Freq: Once | INTRAMUSCULAR | Status: AC
  Administered 2023-05-19: 50 ug via INTRAVENOUS
  Filled 2023-05-19: qty 1
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
## 2023-05-19 NOTE — ED Notes (Signed)
C-collar removed per MD order. Pt was able to ambulate to bathroom with no difficulty
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
## 2023-05-22 ENCOUNTER — Other Ambulatory Visit (HOSPITAL_COMMUNITY): Payer: Self-pay

## 2023-05-24 ENCOUNTER — Other Ambulatory Visit: Payer: Self-pay

## 2023-05-28 ENCOUNTER — Other Ambulatory Visit (HOSPITAL_COMMUNITY): Payer: Self-pay

## 2023-06-04 DIAGNOSIS — E1165 Type 2 diabetes mellitus with hyperglycemia: Secondary | ICD-10-CM | POA: Diagnosis not present

## 2023-06-06 ENCOUNTER — Other Ambulatory Visit: Payer: Self-pay | Admitting: Internal Medicine

## 2023-06-06 ENCOUNTER — Other Ambulatory Visit: Payer: Self-pay

## 2023-06-06 ENCOUNTER — Other Ambulatory Visit (HOSPITAL_COMMUNITY): Payer: Self-pay

## 2023-06-06 MED ORDER — LEVOTHYROXINE SODIUM 100 MCG PO TABS
100.0000 ug | ORAL_TABLET | Freq: Every day | ORAL | 1 refills | Status: DC
  Filled 2023-06-06: qty 90, 90d supply, fill #0
  Filled 2023-09-27: qty 90, 90d supply, fill #1

## 2023-06-06 MED ORDER — HUMALOG KWIKPEN 200 UNIT/ML ~~LOC~~ SOPN
20.0000 [IU] | PEN_INJECTOR | Freq: Three times a day (TID) | SUBCUTANEOUS | 3 refills | Status: DC
  Filled 2023-06-06: qty 12, 28d supply, fill #0
  Filled 2023-08-20 – 2023-08-22 (×3): qty 12, 28d supply, fill #1

## 2023-06-07 ENCOUNTER — Other Ambulatory Visit (HOSPITAL_COMMUNITY): Payer: Self-pay

## 2023-06-26 ENCOUNTER — Other Ambulatory Visit (HOSPITAL_COMMUNITY): Payer: Self-pay

## 2023-07-02 ENCOUNTER — Ambulatory Visit (INDEPENDENT_AMBULATORY_CARE_PROVIDER_SITE_OTHER): Payer: Medicaid Other | Admitting: Internal Medicine

## 2023-07-02 ENCOUNTER — Encounter: Payer: Self-pay | Admitting: Internal Medicine

## 2023-07-02 ENCOUNTER — Other Ambulatory Visit: Payer: Self-pay

## 2023-07-02 VITALS — BP 120/70 | HR 95 | Ht 65.0 in | Wt 186.8 lb

## 2023-07-02 DIAGNOSIS — E890 Postprocedural hypothyroidism: Secondary | ICD-10-CM | POA: Diagnosis not present

## 2023-07-02 DIAGNOSIS — E11311 Type 2 diabetes mellitus with unspecified diabetic retinopathy with macular edema: Secondary | ICD-10-CM

## 2023-07-02 DIAGNOSIS — Z23 Encounter for immunization: Secondary | ICD-10-CM

## 2023-07-02 DIAGNOSIS — Z7985 Long-term (current) use of injectable non-insulin antidiabetic drugs: Secondary | ICD-10-CM

## 2023-07-02 DIAGNOSIS — Z7984 Long term (current) use of oral hypoglycemic drugs: Secondary | ICD-10-CM

## 2023-07-02 DIAGNOSIS — E1165 Type 2 diabetes mellitus with hyperglycemia: Secondary | ICD-10-CM | POA: Diagnosis not present

## 2023-07-02 DIAGNOSIS — E782 Mixed hyperlipidemia: Secondary | ICD-10-CM

## 2023-07-02 LAB — MICROALBUMIN / CREATININE URINE RATIO
Creatinine,U: 149.2 mg/dL
Microalb Creat Ratio: 0.5 mg/g (ref 0.0–30.0)
Microalb, Ur: <0.7 mg/dL (ref 0.0–1.9)

## 2023-07-02 LAB — POCT GLYCOSYLATED HEMOGLOBIN (HGB A1C): Hemoglobin A1C: 8.4 % — AB (ref 4.0–5.6)

## 2023-07-02 MED ORDER — DEXCOM G7 SENSOR MISC
3 refills | Status: DC
Start: 2023-07-02 — End: 2024-02-10
  Filled 2023-07-02: qty 3, 30d supply, fill #0
  Filled 2023-07-17 – 2023-08-06 (×3): qty 3, 30d supply, fill #1
  Filled 2023-09-23: qty 3, 30d supply, fill #2
  Filled 2023-10-19: qty 3, 30d supply, fill #3
  Filled 2023-11-21: qty 3, 30d supply, fill #4
  Filled 2023-12-22: qty 3, 30d supply, fill #5
  Filled 2024-01-19: qty 3, 30d supply, fill #6

## 2023-07-02 NOTE — Patient Instructions (Addendum)
Please use the following regimen: - Farxiga 10 mg before b'fast - Semglee 40 units daily - Ozempic 1 mg weekly  Please increase: - Humalog  15 min before meals 25 units before b'fast and lunch 25-30 units before dinner  Please continue Levothyroxine 100 mcg daily.  Take the thyroid hormone every day, with water, at least 30 minutes before breakfast, separated by at least 4 hours from: - acid reflux medications - calcium - iron - multivitamins  Please return in 3 months.

## 2023-07-02 NOTE — Addendum Note (Signed)
Addended by: Pollie Meyer on: 07/02/2023 11:37 AM   Modules accepted: Orders
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
## 2023-07-12 ENCOUNTER — Other Ambulatory Visit (HOSPITAL_COMMUNITY): Payer: Self-pay

## 2023-07-12 MED ORDER — AZITHROMYCIN 250 MG PO TABS
ORAL_TABLET | ORAL | 0 refills | Status: AC
  Filled 2023-07-12: qty 6, 5d supply, fill #0

## 2023-07-17 ENCOUNTER — Other Ambulatory Visit: Payer: Self-pay | Admitting: Internal Medicine

## 2023-07-17 ENCOUNTER — Other Ambulatory Visit (HOSPITAL_COMMUNITY): Payer: Self-pay

## 2023-07-17 ENCOUNTER — Other Ambulatory Visit: Payer: Self-pay

## 2023-07-17 MED ORDER — TRESIBA FLEXTOUCH 200 UNIT/ML ~~LOC~~ SOPN
40.0000 [IU] | PEN_INJECTOR | Freq: Every day | SUBCUTANEOUS | 3 refills | Status: DC
Start: 2023-07-17 — End: 2023-11-01
  Filled 2023-07-17 – 2023-08-06 (×2): qty 18, 90d supply, fill #0

## 2023-07-24 ENCOUNTER — Other Ambulatory Visit (HOSPITAL_COMMUNITY): Payer: Self-pay

## 2023-07-25 ENCOUNTER — Other Ambulatory Visit (HOSPITAL_COMMUNITY): Payer: Self-pay

## 2023-07-29 ENCOUNTER — Telehealth: Payer: Self-pay

## 2023-07-29 ENCOUNTER — Other Ambulatory Visit (HOSPITAL_COMMUNITY): Payer: Self-pay

## 2023-07-29 NOTE — Telephone Encounter (Signed)
Pharmacy Patient Advocate Encounter   Received notification from Pt Calls Messages that prior authorization for Samantha [name] is required/requested.   Insurance verification completed.   The patient is insured through [facility] .   Per test claim: PA required; PA submitted to above mentioned insurance via CoverMyMeds Key/confirmation #/EOC [id number] Status is pending

## 2023-07-29 NOTE — Telephone Encounter (Signed)
Pt needs a PA for [place] U200

## 2023-07-30 ENCOUNTER — Other Ambulatory Visit: Payer: Self-pay

## 2023-07-30 ENCOUNTER — Other Ambulatory Visit (HOSPITAL_COMMUNITY): Payer: Self-pay

## 2023-07-30 MED ORDER — MONTELUKAST SODIUM 10 MG PO TABS
10.0000 mg | ORAL_TABLET | Freq: Every day | ORAL | 11 refills | Status: DC
  Filled 2023-07-30: qty 30, 30d supply, fill #0

## 2023-07-30 MED ORDER — SERTRALINE HCL 100 MG PO TABS
100.0000 mg | ORAL_TABLET | Freq: Every day | ORAL | 1 refills | Status: DC
  Filled 2023-07-30 – 2023-11-11 (×2): qty 90, 90d supply, fill #0

## 2023-07-31 ENCOUNTER — Telehealth: Payer: Self-pay

## 2023-07-31 NOTE — Telephone Encounter (Signed)
Patient came in to office today and picked up sample of Tresiba (1 Pen).

## 2023-07-31 NOTE — Telephone Encounter (Signed)
Patient called stating she is out of medication and a PA is currently pending for approval.  Per Dr. Elvera Lennox we could provide 1 pen.   Sample  Medication:Tresiba  Dose: u-200 Quantity:1 PEN WJX:[id number] EXP:03/09/2025  Samantha [name]

## 2023-08-02 ENCOUNTER — Other Ambulatory Visit: Payer: Self-pay

## 2023-08-02 ENCOUNTER — Telehealth: Payer: Self-pay

## 2023-08-02 ENCOUNTER — Other Ambulatory Visit (HOSPITAL_COMMUNITY): Payer: Self-pay

## 2023-08-02 NOTE — Telephone Encounter (Signed)
Pharmacy Patient Advocate Encounter   Received notification from CoverMyMeds that prior authorization for Ozempic is required/requested.   Insurance verification completed.   The patient is insured through [facility] .   Per test claim: PA required; PA submitted to above mentioned insurance via CoverMyMeds Key/confirmation #/EOC [id number] Status is pending

## 2023-08-05 NOTE — Telephone Encounter (Signed)
Pharmacy Patient Advocate Encounter  Received notification from [facility] that Prior Authorization for Samantha [name] has been APPROVED from 07/29/23 to 07/28/24   PA #/Case ID/Reference #: [id number]

## 2023-08-05 NOTE — Telephone Encounter (Signed)
Pharmacy Patient Advocate Encounter  Received notification from [facility] that Prior Authorization for Ozempic has been APPROVED from 08/03/23 to 08/02/2024   PA #/Case ID/Reference #: [id number]

## 2023-08-06 ENCOUNTER — Telehealth: Payer: Self-pay

## 2023-08-06 ENCOUNTER — Other Ambulatory Visit (HOSPITAL_COMMUNITY): Payer: Self-pay

## 2023-08-06 NOTE — Telephone Encounter (Signed)
Patient needs a PA for Dexcom   PA number to insurance company: [phone]

## 2023-08-06 NOTE — Telephone Encounter (Signed)
Sample  Device/Supplies: Dexcom G7 Quantity:2 GUY:[id number]   [id number] EXP:07/10/24          08/09/24  Dicie Beam

## 2023-08-09 ENCOUNTER — Other Ambulatory Visit (HOSPITAL_COMMUNITY): Payer: Self-pay

## 2023-08-12 ENCOUNTER — Other Ambulatory Visit (HOSPITAL_COMMUNITY): Payer: Self-pay

## 2023-08-12 ENCOUNTER — Telehealth: Payer: Self-pay

## 2023-08-12 NOTE — Telephone Encounter (Signed)
Pharmacy Patient Advocate Encounter   Received notification from Pt Calls Messages that prior authorization for Dexcom G7 sensor is required/requested.   Insurance verification completed.   The patient is insured through [facility] .   Per test claim: PA required; PA submitted to above mentioned insurance via CoverMyMeds Key/confirmation #/EOC [id number] Status is pending

## 2023-08-16 NOTE — Telephone Encounter (Signed)
Pharmacy Patient Advocate Encounter  Received notification from [facility] that Prior Authorization for Dexcom G7 sensor has been APPROVED through 02/10/24   PA #/Case ID/Reference #: [id number]

## 2023-08-17 ENCOUNTER — Other Ambulatory Visit (HOSPITAL_COMMUNITY): Payer: Self-pay

## 2023-08-17 ENCOUNTER — Other Ambulatory Visit (HOSPITAL_BASED_OUTPATIENT_CLINIC_OR_DEPARTMENT_OTHER): Payer: Self-pay

## 2023-08-19 ENCOUNTER — Other Ambulatory Visit: Payer: Self-pay

## 2023-08-20 ENCOUNTER — Other Ambulatory Visit: Payer: Self-pay

## 2023-08-21 ENCOUNTER — Other Ambulatory Visit: Payer: Self-pay

## 2023-08-22 ENCOUNTER — Other Ambulatory Visit (HOSPITAL_COMMUNITY): Payer: Self-pay

## 2023-08-24 ENCOUNTER — Other Ambulatory Visit (HOSPITAL_COMMUNITY): Payer: Self-pay

## 2023-08-26 IMAGING — MG MM DIGITAL SCREENING BILAT W/ TOMO AND CAD
6 of 10 series · 6 of 30 positions shown · non-contrast
Comparison: Previous exam(s).

CLINICAL DATA: Screening.

EXAM:
DIGITAL SCREENING BILATERAL MAMMOGRAM WITH TOMOSYNTHESIS AND CAD
TECHNIQUE: Bilateral screening digital craniocaudal and mediolateral oblique
mammograms were obtained. Bilateral screening digital breast
tomosynthesis was performed. The images were evaluated with
computer-aided detection.

[R MLO synth-2D]
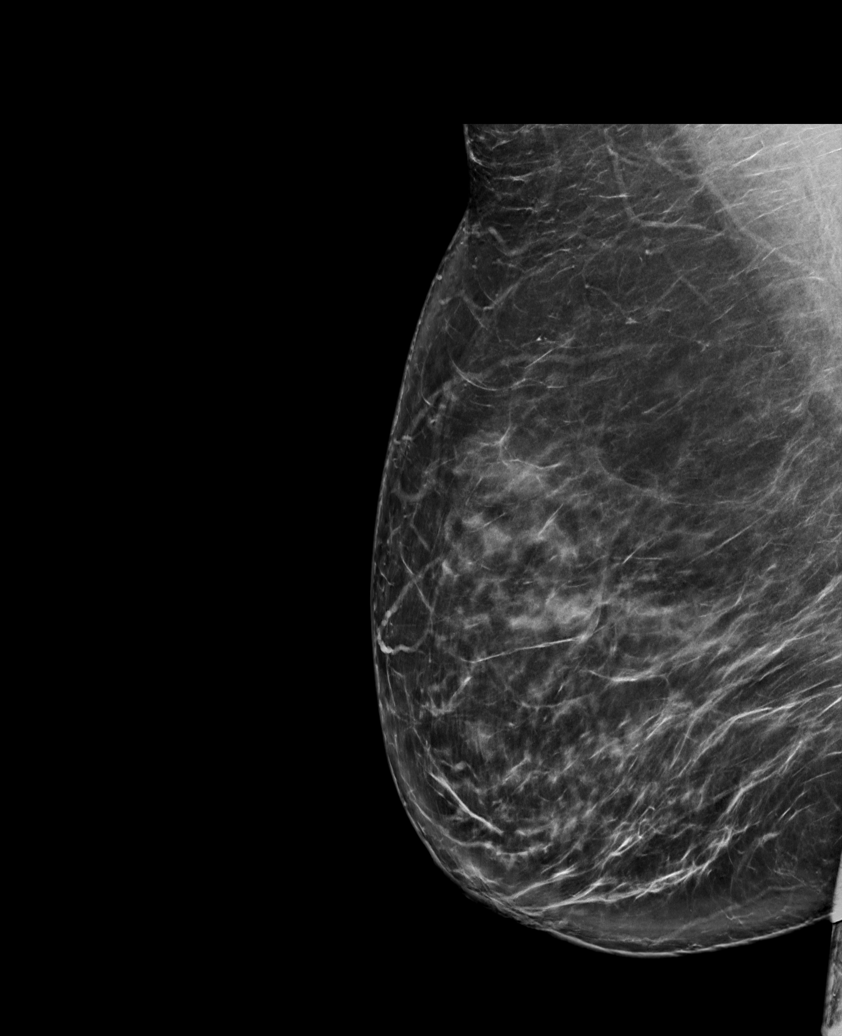

[L MLO synth-2D (1 of 2)]
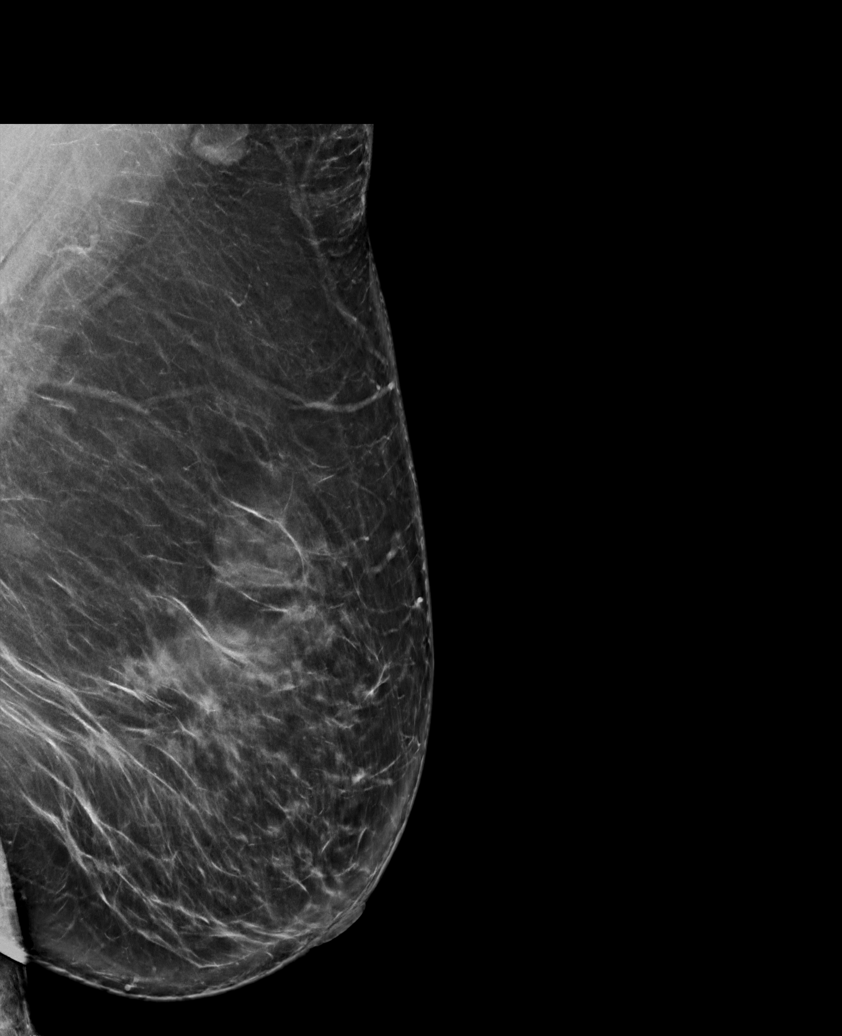

[L CC synth-2D]
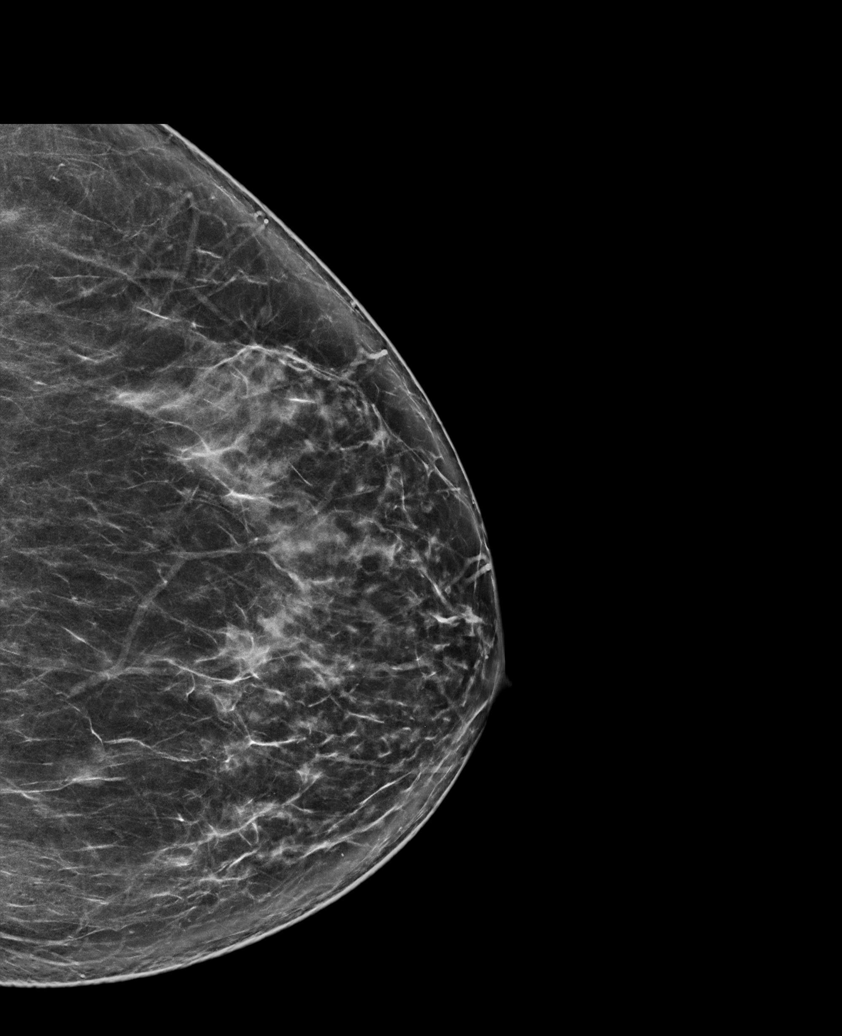

[R CC synth-2D]
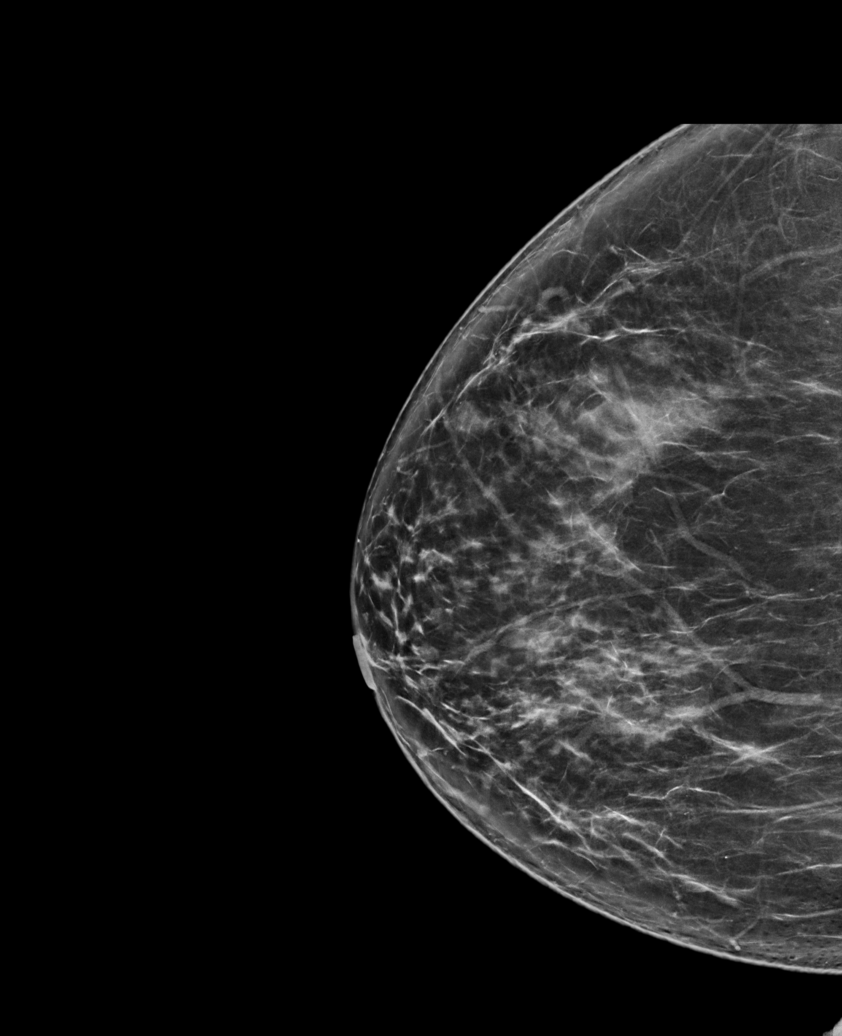

[L MLO synth-2D (2 of 2)]
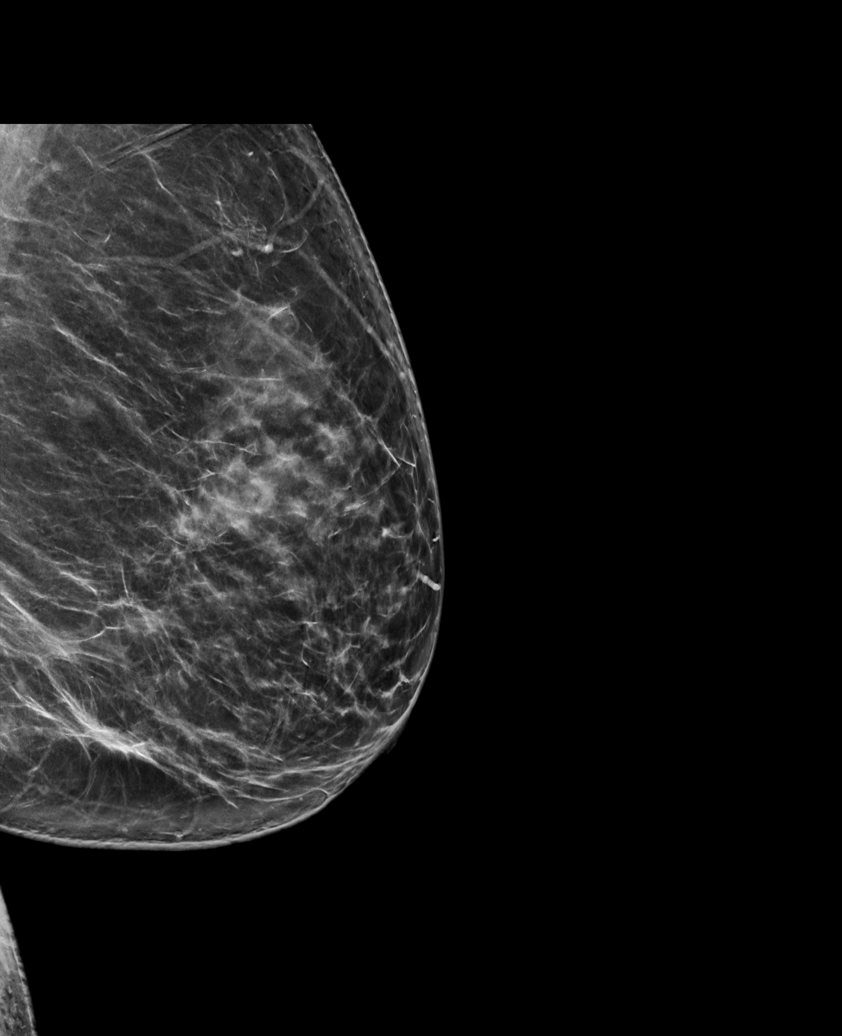

[L MLO tomo · tomo slice 45/90.0]
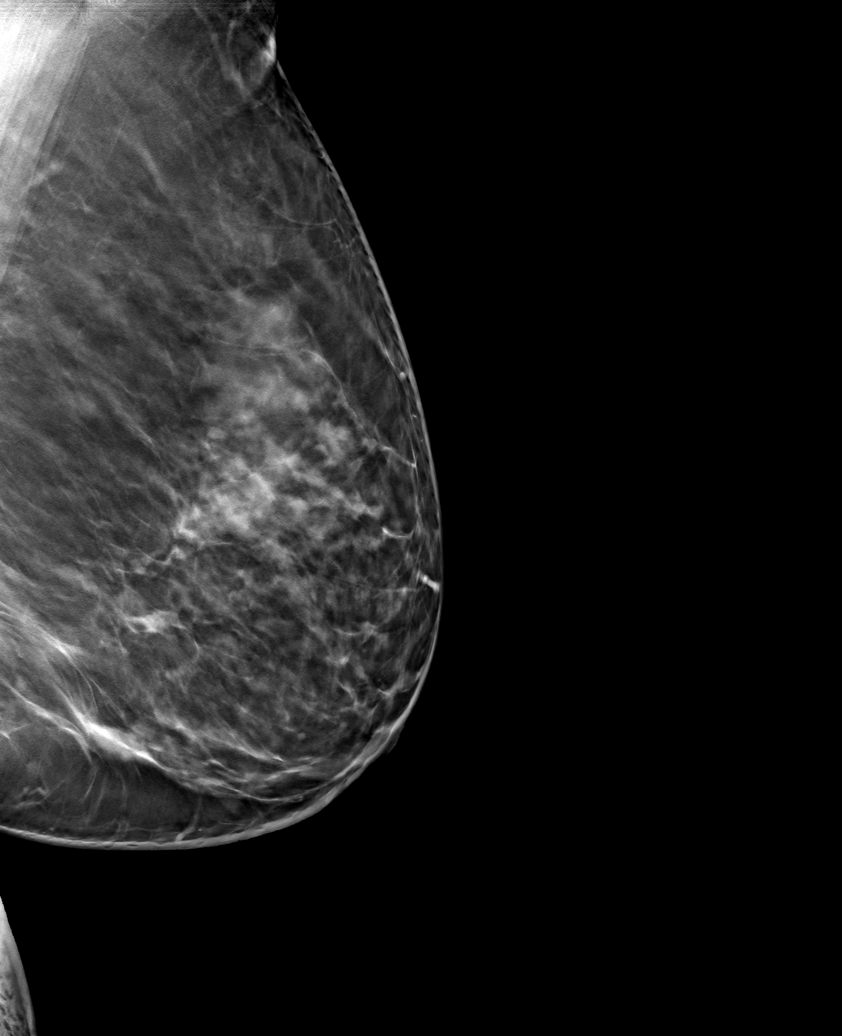

[6 of 30 positions shown; findings below may reference images not displayed]

ACR Breast Density Category c: The breast tissue is heterogeneously
dense, which may obscure small masses.
FINDINGS: There are no findings suspicious for malignancy.
IMPRESSION: No mammographic evidence of malignancy. A result letter of this
screening mammogram will be mailed directly to the patient.

RECOMMENDATION:
Screening mammogram in one year. (Code:Q3-W-BC3)

BI-RADS CATEGORY  1: Negative.

## 2023-08-27 ENCOUNTER — Other Ambulatory Visit (HOSPITAL_COMMUNITY): Payer: Self-pay

## 2023-08-28 ENCOUNTER — Other Ambulatory Visit: Payer: Self-pay | Admitting: Internal Medicine

## 2023-08-28 ENCOUNTER — Other Ambulatory Visit (HOSPITAL_COMMUNITY): Payer: Self-pay

## 2023-08-30 ENCOUNTER — Other Ambulatory Visit: Payer: Self-pay

## 2023-08-30 MED ORDER — HUMALOG KWIKPEN 200 UNIT/ML ~~LOC~~ SOPN
25.0000 [IU] | PEN_INJECTOR | Freq: Three times a day (TID) | SUBCUTANEOUS | 3 refills | Status: DC
Start: 2023-08-30 — End: 2024-04-18
  Filled 2023-08-30: qty 18, 34d supply, fill #0
  Filled 2023-09-16 (×2): qty 18, 20d supply, fill #0
  Filled 2023-11-03: qty 18, 20d supply, fill #1
  Filled 2023-12-29: qty 18, 20d supply, fill #2
  Filled 2024-02-19: qty 18, 20d supply, fill #3

## 2023-09-02 ENCOUNTER — Other Ambulatory Visit (HOSPITAL_COMMUNITY): Payer: Self-pay

## 2023-09-03 ENCOUNTER — Other Ambulatory Visit: Payer: Self-pay

## 2023-09-03 ENCOUNTER — Encounter: Payer: Self-pay | Admitting: Internal Medicine

## 2023-09-03 ENCOUNTER — Other Ambulatory Visit (HOSPITAL_COMMUNITY): Payer: Self-pay

## 2023-09-03 ENCOUNTER — Ambulatory Visit: Payer: Medicaid Other | Admitting: Internal Medicine

## 2023-09-03 VITALS — BP 138/88 | HR 88 | Temp 97.8°F | Resp 16 | Ht 64.96 in | Wt 188.2 lb

## 2023-09-03 DIAGNOSIS — J3089 Other allergic rhinitis: Secondary | ICD-10-CM

## 2023-09-03 DIAGNOSIS — K219 Gastro-esophageal reflux disease without esophagitis: Secondary | ICD-10-CM

## 2023-09-03 DIAGNOSIS — R053 Chronic cough: Secondary | ICD-10-CM | POA: Diagnosis not present

## 2023-09-03 DIAGNOSIS — R051 Acute cough: Secondary | ICD-10-CM

## 2023-09-03 MED ORDER — FLUTICASONE PROPIONATE 50 MCG/ACT NA SUSP
1.0000 | Freq: Every day | NASAL | 5 refills | Status: DC
Start: 2023-09-03 — End: 2023-11-15
  Filled 2023-09-03 (×2): qty 16, 34d supply, fill #0

## 2023-09-03 MED ORDER — OMEPRAZOLE 20 MG PO CPDR
20.0000 mg | DELAYED_RELEASE_CAPSULE | Freq: Every day | ORAL | 5 refills | Status: DC
  Filled 2023-09-03 (×2): qty 30, 30d supply, fill #0

## 2023-09-03 MED ORDER — CETIRIZINE HCL 10 MG PO TABS
10.0000 mg | ORAL_TABLET | Freq: Every day | ORAL | 5 refills | Status: DC | PRN
  Filled 2023-09-03 (×2): qty 30, 30d supply, fill #0

## 2023-09-03 MED ORDER — MONTELUKAST SODIUM 10 MG PO TABS
10.0000 mg | ORAL_TABLET | Freq: Every day | ORAL | 5 refills | Status: DC
  Filled 2023-09-03 (×2): qty 30, 30d supply, fill #0

## 2023-09-03 NOTE — Patient Instructions (Addendum)
Other Allergic Rhinitis - Use nasal saline rinses before nose sprays such as with Neilmed Sinus Rinse.  Use distilled water.   - Use Flonase 1-2 sprays each nostril daily. Aim upward and outward. If you notice lots of nasal dryness, use it every other day.  - Use Zyrtec 10 mg daily as needed for runny nose, sneezing, itchy watery eyes.  - Use Singulair 10mg  daily.  Stop if there are any mood/behavioral changes.  GERD - Use Omeprazole 20mg  daily on an empty stomach for at least 6 weeks.  Eat a small meal about 45 minutes after.   -Avoid lying down for at least two hours after a meal or after drinking acidic beverages, like soda, or other caffeinated beverages. This can help to prevent stomach contents from flowing back into the esophagus. -Keep your head elevated while you sleep. Using an extra pillow or two can also help to prevent reflux. -Eat smaller and more frequent meals each day instead of a few large meals. This promotes digestion and can aid in preventing heartburn. -Wear loose-fitting clothes to ease pressure on the stomach, which can worsen heartburn and reflux. -Reduce excess weight around the midsection. This can ease pressure on the stomach. Such pressure can force some stomach contents back up the esophagus.   Chronic Cough/Dypsnea  - Continue follow up with Pulm. - Remember to use the inhalers as prescribed: Wixela (Fluticasone-Salmeterol) 100-89mcg 1 puff twice daily.  Brush and gargle after use.    1/3 at 2:30 PM for skin testing 1-55, IDs okay  Hold all anti-histamines (Xyzal, Allegra, Zyrtec, Claritin, Benadryl) 3 days prior to next visit.

## 2023-09-03 NOTE — Progress Notes (Signed)
NEW PATIENT  Date of Service/Encounter:  09/03/23  Consult requested by: [name], MD   Subjective:   Samantha [name] (DOB: 1966-02-20) is a 57 y.o. female who presents to the clinic on 09/03/2023 with a chief complaint of Allergic Rhinitis  (Has been having a lot of pain and allergies and would like to know what's going on.) .    History obtained from: chart review and patient.   Rhinitis:  Started around age 3s.   Symptoms include: nasal congestion, rhinorrhea, and post nasal drainage sinus pressure/pain, sometimes nasal dryness   Occurs seasonally-Fall/Winter  Potential triggers: not sure  Treatments tried:  Singulair Flonase PRN Claritin PRN  Previous allergy testing: yes in 1990s but can't recall results  History of sinus surgery: no Nonallergic triggers: none   GERD: Does note heartburn and reflux intermittently.  Not taking PPI daily. Does sometimes have a sour taste in mouth in AM.  Cough/SOB:  Of note, she is followed by Pulm for cough/SOB.  Attempting trial on ICS/LABA with Wixela, previously Flovent.  PFT has shown restriction in the past. She is not taking the Wixela twice daily just PRN. Denies hx of asthma in childhood.  No tobacco use.  CXR 01/2022: No suspicious pulmonary opacities identified.   Reviewed:  PFT 05/03/2022: low FEV1, FVC with elevated ratio concerning for restriction.  Slightly low DLCO and TLC.    07/02/2023: seen by Dr Elvera Lennox Endo for Dm II, hypothyroidism, HLD.  Uncontrolled. On Faxiga, Ozempic, Semglee + Humalog.   03/15/2023: seen by [name] for cough/dyspnea x 2 months. Discussed use of Wixela.  Also restart PPI.  10/08/2018: seen by Dr Delorse Lek for pruritus/urticaria. Discussed use of Zyrtec/Pepcid.  Also discussed amoxicillin challenge to help clear penicillin allergy.   Past Medical History: Past Medical History:  Diagnosis Date   Anemia    Anxiety    Arthritis    Chest pain of uncertain etiology     Depression    Diabetes mellitus without complication (HCC)    Type 2   Dyspnea    Hyperlipidemia    Hypothyroidism    Palpitations    Pneumonia    Thyroid disease    Urticaria    Vitamin D deficiency     Past Surgical History: Past Surgical History:  Procedure Laterality Date   CESAREAN SECTION     SHOULDER ARTHROSCOPY WITH ROTATOR CUFF REPAIR AND SUBACROMIAL DECOMPRESSION Right 07/25/2022   Procedure: Right shoulder arthroscopy, subacromial decompression, debridement, mini open rotator cuff repair;  Surgeon: Jene Every, MD;  Location: [facility];  Service: Orthopedics;  Laterality: Right;   UTERINE FIBROID EMBOLIZATION     WISDOM TOOTH EXTRACTION      Family History: Family History  Problem Relation Age of Onset   Allergic rhinitis Mother    Dementia Mother    Diabetes Mother    Stroke Father    Colon polyps Neg Hx    Colon cancer Neg Hx    Esophageal cancer Neg Hx    Stomach cancer Neg Hx    Rectal cancer Neg Hx     Social History:  Flooring in bedroom: carpet Pets: dog Tobacco use/exposure: none Job: medical records, ride share   Medication List:  Allergies as of 09/03/2023       Reactions   Bactrim [sulfamethoxazole-trimethoprim] Other (See Comments)   Appears to be causing DRESS   Bupropion Hives   Augmentin [amoxicillin-pot Clavulanate] Itching   Cyclobenzaprine Other (See Comments)   Excessive sleepiness  Lexapro [escitalopram] Diarrhea, Nausea Only   Pravastatin Other (See Comments)   Leg cramps/ mylagia   Atorvastatin Other (See Comments)   Leg cramps   Doxepin Hcl Other (See Comments)   Nightmares        Medication List        Accurate as of September 03, 2023  8:55 AM. If you have any questions, ask your nurse or doctor.          Advair Diskus 100-50 MCG/ACT Aepb Generic drug: fluticasone-salmeterol Inhale 1 puff into the lungs 2 (two) times daily.   clobetasol 0.05 % external solution Commonly known as: TEMOVATE Apply a small  amount to scalp twice a day   Dexcom G7 Sensor Misc Use to monitor glucose continuously, change sensor every 10 days   Diclofenac Sodium CR 100 MG 24 hr tablet Take 1 tablet (100 mg total) by mouth daily.   Farxiga 10 MG Tabs tablet Generic drug: dapagliflozin propanediol Take by mouth daily.   Farxiga 10 MG Tabs tablet Generic drug: dapagliflozin propanediol Take 1 tablet (10 mg total) by mouth daily.   HumaLOG KwikPen 200 UNIT/ML KwikPen Generic drug: insulin lispro Inject 25-30 Units into the skin 3 (three) times daily before meals.   ibuprofen 800 MG tablet Commonly known as: ADVIL Take 1 tablet (800 mg total) by mouth every 8 (eight) hours as needed, for pain. Take with food.   levothyroxine 100 MCG tablet Commonly known as: SYNTHROID Take 1 tablet (100 mcg total) by mouth daily before breakfast.   methocarbamol 500 MG tablet Commonly known as: ROBAXIN Take 1 tablet (500 mg total) by mouth 2 (two) times daily.   montelukast 10 MG tablet Commonly known as: Singulair Take 1 tablet (10 mg total) by mouth daily.   omeprazole 20 MG capsule Commonly known as: PRILOSEC Take 1 capsule (20 mg total) by mouth daily.   Ozempic (1 MG/DOSE) 4 MG/3ML Sopn Generic drug: Semaglutide (1 MG/DOSE) Inject 1 mg into the skin once a week.   Pentips Generic Pen Needles 32G X 4 MM Misc Generic drug: Insulin Pen Needle Use 5 times a day   rosuvastatin 5 MG tablet Commonly known as: CRESTOR Take 1 tablet (5 mg total) by mouth daily.   sertraline 100 MG tablet Commonly known as: ZOLOFT Take 1 tablet (100 mg total) by mouth daily.   Evaristo Bury FlexTouch 200 UNIT/ML FlexTouch Pen Generic drug: insulin degludec Inject 40 Units into the skin daily.   Vitamin D (Ergocalciferol) 1.25 MG (50000 UNIT) Caps capsule Commonly known as: DRISDOL Take 5,000 Units by mouth daily.         REVIEW OF SYSTEMS: Pertinent positives and negatives discussed in HPI.   Objective:   Physical  Exam: BP 138/88 (BP Location: Right Arm, Patient Position: Sitting, Cuff Size: Normal)   Pulse 88   Temp 97.8 F (36.6 C) (Temporal)   Resp 16   Ht 5' 4.96" (1.65 m)   Wt 188 lb 3.2 oz (85.4 kg)   LMP 07/10/2012   SpO2 97%   BMI 31.36 kg/m  Body mass index is 31.36 kg/m. GEN: alert, well developed HEENT: clear conjunctiva, nose with + moderate inferior turbinate hypertrophy, pink nasal mucosa, slight clear rhinorrhea, + cobblestoning HEART: regular rate and rhythm, no murmur LUNGS: clear to auscultation bilaterally, no coughing, unlabored respiration ABDOMEN: soft, non distended  SKIN: no rashes or lesions  Assessment:   1. Other allergic rhinitis   2. Chronic cough   3. Gastroesophageal reflux disease  without esophagitis     Plan/Recommendations:  Other Allergic Rhinitis - Due to turbinate hypertrophy, seasonal symptoms and unresponsive to over the counter meds, will perform skin testing to identify aeroallergen triggers.   - Use nasal saline rinses before nose sprays such as with Neilmed Sinus Rinse.  Use distilled water.   - Use Flonase 1-2 sprays each nostril daily. Aim upward and outward. If you notice lots of nasal dryness, use it every other day.  - Use Zyrtec 10 mg daily as needed for runny nose, sneezing, itchy watery eyes.  - Use Singulair 10mg  daily.  Stop if there are any mood/behavioral changes.  GERD - Use Omeprazole 20mg  daily on an empty stomach for at least 6-8 weeks.  Eat a small meal about 45 minutes after.   -Avoid lying down for at least two hours after a meal or after drinking acidic beverages, like soda, or other caffeinated beverages. This can help to prevent stomach contents from flowing back into the esophagus. -Keep your head elevated while you sleep. Using an extra pillow or two can also help to prevent reflux. -Eat smaller and more frequent meals each day instead of a few large meals. This promotes digestion and can aid in preventing  heartburn. -Wear loose-fitting clothes to ease pressure on the stomach, which can worsen heartburn and reflux. -Reduce excess weight around the midsection. This can ease pressure on the stomach. Such pressure can force some stomach contents back up the esophagus.   Chronic Cough/Dyspnea   - Continue follow up with Pulm. - Spirometry in the past concerning for restriction.  - Remember to use the inhalers as prescribed: Wixela (Fluticasone-Salmeterol) 100-39mcg 1 puff twice daily.  Brush and gargle after use.    1/3 at 2:30 PM for skin testing 1-55, IDs okay    Alesia Morin, MD [facility]

## 2023-09-05 ENCOUNTER — Other Ambulatory Visit (HOSPITAL_COMMUNITY): Payer: Self-pay

## 2023-09-05 MED ORDER — CYCLOBENZAPRINE HCL 10 MG PO TABS
10.0000 mg | ORAL_TABLET | Freq: Three times a day (TID) | ORAL | 0 refills | Status: AC
  Filled 2023-09-05 (×2): qty 21, 7d supply, fill #0

## 2023-09-05 MED ORDER — MELOXICAM 7.5 MG PO TABS
7.5000 mg | ORAL_TABLET | Freq: Two times a day (BID) | ORAL | 0 refills | Status: AC
  Filled 2023-09-05 (×2): qty 28, 14d supply, fill #0

## 2023-09-06 ENCOUNTER — Other Ambulatory Visit: Payer: Self-pay | Admitting: Internal Medicine

## 2023-09-06 ENCOUNTER — Telehealth: Payer: Self-pay

## 2023-09-06 ENCOUNTER — Other Ambulatory Visit: Payer: Self-pay

## 2023-09-06 ENCOUNTER — Other Ambulatory Visit (HOSPITAL_COMMUNITY): Payer: Self-pay

## 2023-09-06 MED ORDER — OZEMPIC (1 MG/DOSE) 4 MG/3ML ~~LOC~~ SOPN
1.0000 mg | PEN_INJECTOR | SUBCUTANEOUS | 3 refills | Status: DC
Start: 2023-09-06 — End: 2023-11-01
  Filled 2023-09-06 – 2023-09-09 (×3): qty 3, 28d supply, fill #0
  Filled 2023-10-19: qty 3, 28d supply, fill #1

## 2023-09-06 NOTE — Telephone Encounter (Signed)
*  Endo  Pharmacy Patient Advocate Encounter   Received notification from CoverMyMeds that prior authorization for HumaLOG KwikPen 200UNIT/ML pen-injectors  is required/requested.   Insurance verification completed.   The patient is insured through [facility] .   Per test claim: PA required; PA submitted to above mentioned insurance via CoverMyMeds Key/confirmation #/EOC [id number] Status is pending

## 2023-09-07 ENCOUNTER — Other Ambulatory Visit (HOSPITAL_COMMUNITY): Payer: Self-pay

## 2023-09-09 ENCOUNTER — Telehealth: Payer: Self-pay

## 2023-09-09 ENCOUNTER — Other Ambulatory Visit (HOSPITAL_COMMUNITY): Payer: Self-pay

## 2023-09-09 ENCOUNTER — Other Ambulatory Visit: Payer: Self-pay

## 2023-09-09 NOTE — Telephone Encounter (Signed)
Received a call from [facility] with the patient on the line stating that they need this PA re-submitted, they stated that it was denied because it was missing clinical notes. I asked for the fax number so I can send the chart notes and she did not provide me with one but she did provide a CB Number for the PA team: [phone]

## 2023-09-09 NOTE — Telephone Encounter (Signed)
See message message from other encounter. New script not sent as of yet.   Per [facility] they should approve the u-200 but they need the PA re-submitted.  Received a call from [facility] with the patient on the line stating that they need this PA re-submitted, they stated that it was denied because it was missing clinical notes.

## 2023-09-09 NOTE — Telephone Encounter (Addendum)
Pts PA was denied for Humalog, I checked on CMM because she is currently out of  medication please see copied and paste message from denial letter:Per your health plan's criteria, this drug is covered if you meet the following: You have tried or cannot use one preferred drug: insulin lispro U-100 KwikPen/Junior/vial (generic for Humalog), Humalog U-100 KwikPen/Junior KwikPen/Vial, insulin aspart U-100 FlexPen/vial (generic for Novolog), Novolog U-100 Cartridge/FlexPen/Vial. The information provided does not show that you meet the criteria listed above. Please speak with your doctor about your choices. This decision was made per the [facility] of [facility] Rapid Acting Insulin Guideline  Or we can do an appeal

## 2023-09-10 ENCOUNTER — Telehealth: Payer: Self-pay

## 2023-09-10 ENCOUNTER — Other Ambulatory Visit (HOSPITAL_COMMUNITY): Payer: Self-pay

## 2023-09-10 NOTE — Telephone Encounter (Signed)
Hey Dr. Elvera Lennox, are you able to Addend her last OV note to say why she needs Humalog U-200? Her insurance will cover it but they need it documented stating why she needs it for them to cover it. If not I will send in the Rx as mentioned yesterday.

## 2023-09-10 NOTE — Telephone Encounter (Signed)
 Pharmacy Patient Advocate Encounter   Received notification from Pt Calls Messages that prior authorization for Humalog  is required/requested.   Insurance verification completed.   The patient is insured through [facility] .   Per test claim:  Novolog  is preferred by the insurance.  If suggested medication is appropriate, Please send in a new RX and discontinue this one. If not, please advise as to why it's not appropriate so that we may request a Prior Authorization. Please note, some preferred medications may still require a PA

## 2023-09-12 NOTE — Telephone Encounter (Signed)
 J, Do know if the patient was able to obtain her Humalog U200?  If not, we can use the U100, if absolutely needed. Ty! C

## 2023-09-13 ENCOUNTER — Ambulatory Visit (INDEPENDENT_AMBULATORY_CARE_PROVIDER_SITE_OTHER): Payer: Medicaid Other | Admitting: Internal Medicine

## 2023-09-13 DIAGNOSIS — J3081 Allergic rhinitis due to animal (cat) (dog) hair and dander: Secondary | ICD-10-CM | POA: Diagnosis not present

## 2023-09-13 DIAGNOSIS — J3089 Other allergic rhinitis: Secondary | ICD-10-CM

## 2023-09-13 DIAGNOSIS — J301 Allergic rhinitis due to pollen: Secondary | ICD-10-CM | POA: Diagnosis not present

## 2023-09-13 MED ORDER — AZELASTINE HCL 0.1 % NA SOLN
2.0000 | Freq: Two times a day (BID) | NASAL | 5 refills | Status: DC | PRN
  Filled 2023-09-13 – 2023-09-16 (×2): qty 30, 34d supply, fill #0

## 2023-09-13 NOTE — Progress Notes (Signed)
 FOLLOW UP Date of Service/Encounter:  09/13/23   Subjective:  Samantha [name] (DOB: 10/18/1965) is a 58 y.o. female who [facility] for follow up for skin testing.   History obtained from: chart review and patient.  Anti histamines held.   Past Medical History: Past Medical History:  Diagnosis Date   Anemia    Anxiety    Arthritis    Chest pain of uncertain etiology    Depression    Diabetes mellitus without complication (HCC)    Type 2   Dyspnea    Hyperlipidemia    Hypothyroidism    Palpitations    Pneumonia    Thyroid  disease    Urticaria    Vitamin D deficiency     Objective:  LMP 07/10/2012  There is no height or weight on file to calculate BMI. Physical Exam: GEN: alert, well developed HEENT: clear conjunctiva, MMM LUNGS: unlabored respiration  Skin Testing:  Skin prick testing was placed, which includes aeroallergens/foods, histamine control, and saline control.  Verbal consent was obtained prior to placing test.  Patient tolerated procedure well.  Allergy  testing results were read and interpreted by myself, documented by clinical staff. Zyrtec  10mg  was given due to significant itching at the site of intradermals.  Adequate positive and negative control.  Positive results to:  Results discussed with patient/family.  Airborne Adult Perc - 09/13/23 1437     Time Antigen Placed 1437    Allergen Manufacturer Jestine    Location Back    Number of Test 55    1. Control-Buffer 50% Glycerol Negative    2. Control-Histamine 3+    3. Bahia Negative    4. Bermuda Negative    5. Johnson Negative    6. [place]  Blue Negative    7. Meadow Fescue Negative    8. Perennial Rye Negative    9. Timothy Negative    10. Ragweed Mix Negative    11. Cocklebur Negative    12. Plantain,  English Negative    13. Baccharis Negative    14. Dog Fennel Negative    15. Russian Thistle Negative    16. Lamb's Quarters Negative     17. Sheep Sorrell Negative    18. Rough Pigweed Negative    19. Marsh Elder, Rough Negative    20. Mugwort, Common Negative    21. Box, Elder Negative    22. Cedar, red Negative    23. Sweet Gum Negative    24. Pecan Pollen Negative    25. Pine Mix Negative    26. Walnut, Black Pollen Negative    27. Red Mulberry Negative    28. Ash Mix Negative    29. Birch Mix Negative    30. Beech American Negative    31. Cottonwood, Eastern Negative    32. Hickory, White 3+    33. Maple Mix Negative    34. Oak, Eastern Mix Negative    35. Sycamore Eastern Negative    36. Alternaria Alternata Negative    37. Cladosporium Herbarum Negative    38. Aspergillus Mix Negative    39. Penicillium Mix Negative    40. Bipolaris Sorokiniana (Helminthosporium) Negative    41. Drechslera Spicifera (Curvularia) Negative    42. Mucor Plumbeus Negative    43. Fusarium Moniliforme Negative    44. Aureobasidium Pullulans (pullulara) Negative    45. Rhizopus Oryzae Negative    46. Botrytis Cinera Negative    47. Epicoccum  Nigrum Negative    48. Phoma Betae Negative    49. Dust Mite Mix Negative    50. Cat Hair 10,000 BAU/ml Negative    51.  Dog Epithelia Negative    52. Mixed Feathers Negative    53. Horse Epithelia Negative    54. Cockroach, German Negative    55. Tobacco Leaf Negative             Intradermal - 09/13/23 1524     Time Antigen Placed 1525    Allergen Manufacturer Jestine    Location Back    Number of Test 15    Control Negative    Bahia 3+    Bermuda Negative    Johnson 3+    7 Grass 3+    Ragweed Mix 3+    Weed Mix Negative    Mold 1 3+    Mold 2 3+    Mold 3 2+    Mold 4 Negative    Mite Mix 3+    Cat 3+    Dog 3+    Cockroach 3+              Assessment:   1. Seasonal allergic rhinitis due to pollen   2. Allergic rhinitis caused by mold   3. Allergic rhinitis due to animal hair or dander   4. Allergic rhinitis due to dust mite   5. Allergic rhinitis  due to insect     Plan/Recommendations:  Allergic Rhinitis - Due to turbinate hypertrophy, seasonal symptoms, chronic cough and unresponsive to over the counter meds, will perform skin testing to identify aeroallergen triggers.  - SPT 09/2023: positive to trees, grasses, weeds, molds, dust mites, cats, dogs, cockroach  - Use nasal saline rinses before nose sprays such as with Neilmed Sinus Rinse.  Use distilled water.   - Use Flonase  2 sprays each nostril every other day. Aim upward and outward.  - Use Azelastine  1-2 sprays each nostril twice daily as needed for runny nose, drainage, sneezing, congestion. Aim upward and outward. - Use Zyrtec  10 mg daily.  - Use Singulair  10mg  daily.  Stop if there are any mood/behavioral changes. - Consider allergy  shots as long term control of your symptoms by teaching your immune system to be more tolerant of your allergy  triggers  GERD - Use Omeprazole  20mg  daily on an empty stomach for at least 6 weeks.  Eat a small meal about 45 minutes after.   -Avoid lying down for at least two hours after a meal or after drinking acidic beverages, like soda, or other caffeinated beverages. This can help to prevent stomach contents from flowing back into the esophagus. -Keep your head elevated while you sleep. Using an extra pillow or two can also help to prevent reflux. -Eat smaller and more frequent meals each day instead of a few large meals. This promotes digestion and can aid in preventing heartburn. -Wear loose-fitting clothes to ease pressure on the stomach, which can worsen heartburn and reflux. -Reduce excess weight around the midsection. This can ease pressure on the stomach. Such pressure can force some stomach contents back up the esophagus.   Chronic Cough/Dypsnea  - Continue follow up with Pulm. - Remember to use the inhalers as prescribed: Wixela (Fluticasone -Salmeterol) 100-50mcg 1 puff twice daily.  Brush and gargle after use.    ALLERGEN AVOIDANCE  MEASURES   Dust Mites Use central air conditioning and heat; and change the filter monthly.  Pleated filters work better than mesh filters.  Electrostatic filters may also be used; wash the filter monthly.  Window air conditioners may be used, but do not clean the air as well as a central air conditioner.  Change or wash the filter monthly. Keep windows closed.  Do not use attic fans.   Encase the mattress, box springs and pillows with zippered, dust proof covers. Wash the bed linens in hot water weekly.   Remove carpet, especially from the bedroom. Remove stuffed animals, throw pillows, dust ruffles, heavy drapes and other items that collect dust from the bedroom. Do not use a humidifier.   Use wood, vinyl or leather furniture instead of cloth furniture in the bedroom. Keep the indoor humidity at 30 - 40%.    Molds - Indoor avoidance Use air conditioning to reduce indoor humidity.  Do not use a humidifier. Keep indoor humidity at 30 - 40%.  Use a dehumidifier if needed. In the bathroom use an exhaust fan or open a window after showering.  Wipe down damp surfaces after showering.  Clean bathrooms with a mold-killing solution (diluted bleach, or products like Tilex, etc) at least once a month. In the kitchen use an exhaust fan to remove steam from cooking.  Throw away spoiled foods immediately, and empty garbage daily.  Empty water pans below self-defrosting refrigerators frequently. Vent the clothes dryer to the outside. Limit indoor houseplants; mold grows in the dirt.  No houseplants in the bedroom. Remove carpet from the bedroom. Encase the mattress and box springs with a zippered encasing.  Molds - Outdoor avoidance Avoid being outside when the grass is being mowed, or the ground is tilled. Avoid playing in leaves, pine straw, hay, etc.  Dead plant materials contain mold. Avoid going into barns or grain storage areas. Remove leaves, clippings and compost from around the  home.  Cockroach Limit spread of food around the house; especially keep food out of bedrooms. Keep food and garbage in closed containers with a tight lid.  Never leave food out in the kitchen.  Do not leave out pet food or dirty food bowls. Mop the kitchen floor and wash countertops at least once a week. Repair leaky pipes and faucets so there is no standing water to attract roaches. Plug up cracks in the house through which cockroaches can enter. Use bait stations and approved pesticides to reduce cockroach infestation. Pollen Avoidance Pollen levels are highest during the mid-day and afternoon.  Consider this when planning outdoor activities. Avoid being outside when the grass is being mowed, or wear a mask if the pollen-allergic person must be the one to mow the grass. Keep the windows closed to keep pollen outside of the home. Use an air conditioner to filter the air. Take a shower, wash hair, and change clothing after working or playing outdoors during pollen season. Pet Dander Keep the pet out of your bedroom and restrict it to only a few rooms. Be advised that keeping the pet in only one room will not limit the allergens to that room. Don't pet, hug or kiss the pet; if you do, wash your hands with soap and water. High-efficiency particulate air (HEPA) cleaners run continuously in a bedroom or living room can reduce allergen levels over time. Regular use of a high-efficiency vacuum cleaner or a central vacuum can reduce allergen levels. Giving your pet a bath at least once a week can reduce airborne allergen.    Return in about 2 months (around 11/11/2023).  Arleta Blanch, MD [facility] 

## 2023-09-13 NOTE — Patient Instructions (Addendum)
 Allergic Rhinitis - SPT 09/2023: positive to trees, grasses, weeds, molds, dust mites, cats, dogs, cockroach  - Use nasal saline rinses before nose sprays such as with Neilmed Sinus Rinse.  Use distilled water.   - Use Flonase  2 sprays each nostril every other day. Aim upward and outward.  - Use Azelastine  1-2 sprays each nostril twice daily as needed for runny nose, drainage, sneezing, congestion. Aim upward and outward. - Use Zyrtec  10 mg daily.  - Use Singulair  10mg  daily.  Stop if there are any mood/behavioral changes. - Consider allergy  shots as long term control of your symptoms by teaching your immune system to be more tolerant of your allergy  triggers  GERD - Use Omeprazole  20mg  daily on an empty stomach for at least 6 weeks.  Eat a small meal about 45 minutes after.   -Avoid lying down for at least two hours after a meal or after drinking acidic beverages, like soda, or other caffeinated beverages. This can help to prevent stomach contents from flowing back into the esophagus. -Keep your head elevated while you sleep. Using an extra pillow or two can also help to prevent reflux. -Eat smaller and more frequent meals each day instead of a few large meals. This promotes digestion and can aid in preventing heartburn. -Wear loose-fitting clothes to ease pressure on the stomach, which can worsen heartburn and reflux. -Reduce excess weight around the midsection. This can ease pressure on the stomach. Such pressure can force some stomach contents back up the esophagus.   Chronic Cough/Dypsnea  - Continue follow up with Pulm. - Remember to use the inhalers as prescribed: Wixela (Fluticasone -Salmeterol) 100-50mcg 1 puff twice daily.  Brush and gargle after use.    ALLERGEN AVOIDANCE MEASURES   Dust Mites Use central air conditioning and heat; and change the filter monthly.  Pleated filters work better than mesh filters.  Electrostatic filters may also be used; wash the filter monthly.  Window  air conditioners may be used, but do not clean the air as well as a central air conditioner.  Change or wash the filter monthly. Keep windows closed.  Do not use attic fans.   Encase the mattress, box springs and pillows with zippered, dust proof covers. Wash the bed linens in hot water weekly.   Remove carpet, especially from the bedroom. Remove stuffed animals, throw pillows, dust ruffles, heavy drapes and other items that collect dust from the bedroom. Do not use a humidifier.   Use wood, vinyl or leather furniture instead of cloth furniture in the bedroom. Keep the indoor humidity at 30 - 40%.    Molds - Indoor avoidance Use air conditioning to reduce indoor humidity.  Do not use a humidifier. Keep indoor humidity at 30 - 40%.  Use a dehumidifier if needed. In the bathroom use an exhaust fan or open a window after showering.  Wipe down damp surfaces after showering.  Clean bathrooms with a mold-killing solution (diluted bleach, or products like Tilex, etc) at least once a month. In the kitchen use an exhaust fan to remove steam from cooking.  Throw away spoiled foods immediately, and empty garbage daily.  Empty water pans below self-defrosting refrigerators frequently. Vent the clothes dryer to the outside. Limit indoor houseplants; mold grows in the dirt.  No houseplants in the bedroom. Remove carpet from the bedroom. Encase the mattress and box springs with a zippered encasing.  Molds - Outdoor avoidance Avoid being outside when the grass is being mowed, or the ground  is tilled. Avoid playing in leaves, pine straw, hay, etc.  Dead plant materials contain mold. Avoid going into barns or grain storage areas. Remove leaves, clippings and compost from around the home.  Cockroach Limit spread of food around the house; especially keep food out of bedrooms. Keep food and garbage in closed containers with a tight lid.  Never leave food out in the kitchen.  Do not leave out pet food or dirty  food bowls. Mop the kitchen floor and wash countertops at least once a week. Repair leaky pipes and faucets so there is no standing water to attract roaches. Plug up cracks in the house through which cockroaches can enter. Use bait stations and approved pesticides to reduce cockroach infestation. Pollen Avoidance Pollen levels are highest during the mid-day and afternoon.  Consider this when planning outdoor activities. Avoid being outside when the grass is being mowed, or wear a mask if the pollen-allergic person must be the one to mow the grass. Keep the windows closed to keep pollen outside of the home. Use an air conditioner to filter the air. Take a shower, wash hair, and change clothing after working or playing outdoors during pollen season. Pet Dander Keep the pet out of your bedroom and restrict it to only a few rooms. Be advised that keeping the pet in only one room will not limit the allergens to that room. Don't pet, hug or kiss the pet; if you do, wash your hands with soap and water. High-efficiency particulate air (HEPA) cleaners run continuously in a bedroom or living room can reduce allergen levels over time. Regular use of a high-efficiency vacuum cleaner or a central vacuum can reduce allergen levels. Giving your pet a bath at least once a week can reduce airborne allergen.

## 2023-09-14 ENCOUNTER — Other Ambulatory Visit (HOSPITAL_COMMUNITY): Payer: Self-pay

## 2023-09-16 ENCOUNTER — Other Ambulatory Visit: Payer: Self-pay

## 2023-09-16 ENCOUNTER — Other Ambulatory Visit (HOSPITAL_COMMUNITY): Payer: Self-pay

## 2023-09-16 MED ORDER — INSULIN LISPRO 100 UNIT/ML IJ SOLN
25.0000 [IU] | Freq: Three times a day (TID) | INTRAMUSCULAR | 1 refills | Status: DC
  Filled 2023-09-16: qty 10, 14d supply, fill #0

## 2023-09-16 NOTE — Addendum Note (Signed)
 Addended by: Pollie Meyer on: 09/16/2023 04:35 PM   Modules accepted: Orders

## 2023-09-16 NOTE — Telephone Encounter (Signed)
 Rx for U100 has been sent in to see if they would fill this one until her PA comes back.

## 2023-09-16 NOTE — Telephone Encounter (Signed)
 Called and left a message, I tried to check on the status on her resubmitted PA since the Addendum was made but I did not see any notes.

## 2023-09-16 NOTE — Telephone Encounter (Signed)
 Any update?

## 2023-09-17 ENCOUNTER — Telehealth: Payer: Self-pay | Admitting: Pharmacy Technician

## 2023-09-17 ENCOUNTER — Other Ambulatory Visit (HOSPITAL_COMMUNITY): Payer: Self-pay

## 2023-09-17 ENCOUNTER — Other Ambulatory Visit: Payer: Self-pay

## 2023-09-17 NOTE — Telephone Encounter (Addendum)
 Pharmacy Patient Advocate Encounter   Received notification from Pt Calls Messages that prior authorization for Humalog  U200 is required/requested.   Insurance verification completed.   The patient is insured through [facility]   Per test claim: PA required; PA submitted to above mentioned insurance via CoverMyMeds Key/confirmation #/EOC [id number] Status is pending

## 2023-09-18 ENCOUNTER — Telehealth: Payer: Self-pay

## 2023-09-18 MED ORDER — BD INSULIN SYRINGE U-500 31G X 6MM 0.5 ML MISC
1 refills | Status: DC
  Filled 2023-09-18: qty 100, 33d supply, fill #0

## 2023-09-18 NOTE — Telephone Encounter (Signed)
 Requested Prescriptions   Signed Prescriptions Disp Refills   Insulin  Syringe/Needle U-500 (BD INSULIN  SYRINGE U-500) 31G X 0.5 ML MISC 100 each 1    Sig: Use to inject 25 units 3x a day before meals    Authorizing Provider: TRIXIE FILE    Ordering User: CLEOTILDE ROLIN RAMAN

## 2023-09-19 ENCOUNTER — Other Ambulatory Visit: Payer: Self-pay

## 2023-09-19 ENCOUNTER — Other Ambulatory Visit (HOSPITAL_COMMUNITY): Payer: Self-pay

## 2023-09-20 NOTE — Telephone Encounter (Signed)
 Pharmacy Patient Advocate Encounter  Received notification from [facility] that Prior Authorization for Humalog has been APPROVED through 09/16/2024   PA #/Case ID/Reference #: [id number]

## 2023-09-24 ENCOUNTER — Other Ambulatory Visit: Payer: Self-pay

## 2023-09-24 ENCOUNTER — Other Ambulatory Visit (HOSPITAL_COMMUNITY): Payer: Self-pay

## 2023-09-24 ENCOUNTER — Other Ambulatory Visit (HOSPITAL_BASED_OUTPATIENT_CLINIC_OR_DEPARTMENT_OTHER): Payer: Self-pay

## 2023-09-27 ENCOUNTER — Encounter (HOSPITAL_COMMUNITY): Payer: Self-pay

## 2023-09-27 ENCOUNTER — Other Ambulatory Visit: Payer: Self-pay

## 2023-09-27 ENCOUNTER — Other Ambulatory Visit (HOSPITAL_COMMUNITY): Payer: Self-pay

## 2023-10-02 ENCOUNTER — Other Ambulatory Visit (HOSPITAL_COMMUNITY): Payer: Self-pay

## 2023-10-04 ENCOUNTER — Other Ambulatory Visit: Payer: Self-pay

## 2023-10-07 ENCOUNTER — Ambulatory Visit: Payer: Medicaid Other | Admitting: Internal Medicine

## 2023-10-19 ENCOUNTER — Other Ambulatory Visit (HOSPITAL_COMMUNITY): Payer: Self-pay

## 2023-10-19 ENCOUNTER — Other Ambulatory Visit: Payer: Self-pay

## 2023-10-20 ENCOUNTER — Other Ambulatory Visit: Payer: Self-pay

## 2023-10-21 ENCOUNTER — Other Ambulatory Visit (HOSPITAL_BASED_OUTPATIENT_CLINIC_OR_DEPARTMENT_OTHER): Payer: Self-pay

## 2023-10-21 ENCOUNTER — Other Ambulatory Visit: Payer: Self-pay | Admitting: Internal Medicine

## 2023-10-21 ENCOUNTER — Other Ambulatory Visit (HOSPITAL_COMMUNITY): Payer: Self-pay

## 2023-10-21 ENCOUNTER — Other Ambulatory Visit: Payer: Self-pay

## 2023-10-21 MED ORDER — DAPAGLIFLOZIN PROPANEDIOL 10 MG PO TABS
10.0000 mg | ORAL_TABLET | Freq: Every day | ORAL | 1 refills | Status: DC
  Filled 2023-10-21: qty 90, 90d supply, fill #0

## 2023-10-23 ENCOUNTER — Other Ambulatory Visit: Payer: Self-pay

## 2023-10-28 ENCOUNTER — Other Ambulatory Visit (HOSPITAL_COMMUNITY): Payer: Self-pay

## 2023-10-31 ENCOUNTER — Other Ambulatory Visit (HOSPITAL_COMMUNITY): Payer: Self-pay

## 2023-10-31 ENCOUNTER — Telehealth: Payer: Self-pay
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
## 2023-10-31 NOTE — Telephone Encounter (Signed)
Pharmacy Patient Advocate Encounter   Received notification from CoverMyMeds that prior authorization for Samantha [name] is required/requested.   Insurance verification completed.   The patient is insured through [facility] .   Per test claim: PA required; PA submitted to above mentioned insurance via CoverMyMeds Key/confirmation #/EOC [id number] Status is pending

## 2023-11-01 ENCOUNTER — Other Ambulatory Visit (HOSPITAL_COMMUNITY): Payer: Self-pay

## 2023-11-01 ENCOUNTER — Encounter: Payer: Self-pay | Admitting: Internal Medicine

## 2023-11-01 ENCOUNTER — Ambulatory Visit (INDEPENDENT_AMBULATORY_CARE_PROVIDER_SITE_OTHER): Payer: Medicaid Other | Admitting: Internal Medicine

## 2023-11-01 ENCOUNTER — Other Ambulatory Visit: Payer: Self-pay

## 2023-11-01 VITALS — BP 120/70 | HR 95 | Ht 64.96 in | Wt 192.8 lb

## 2023-11-01 DIAGNOSIS — E782 Mixed hyperlipidemia: Secondary | ICD-10-CM

## 2023-11-01 DIAGNOSIS — E1165 Type 2 diabetes mellitus with hyperglycemia: Secondary | ICD-10-CM

## 2023-11-01 DIAGNOSIS — E11311 Type 2 diabetes mellitus with unspecified diabetic retinopathy with macular edema: Secondary | ICD-10-CM | POA: Diagnosis not present

## 2023-11-01 DIAGNOSIS — E890 Postprocedural hypothyroidism: Secondary | ICD-10-CM

## 2023-11-01 DIAGNOSIS — Z794 Long term (current) use of insulin: Secondary | ICD-10-CM

## 2023-11-01 DIAGNOSIS — Z7985 Long-term (current) use of injectable non-insulin antidiabetic drugs: Secondary | ICD-10-CM | POA: Diagnosis not present

## 2023-11-01 LAB — POCT GLYCOSYLATED HEMOGLOBIN (HGB A1C): Hemoglobin A1C: 10.4 % — AB (ref 4.0–5.6)

## 2023-11-01 MED ORDER — OZEMPIC (2 MG/DOSE) 8 MG/3ML ~~LOC~~ SOPN
2.0000 mg | PEN_INJECTOR | SUBCUTANEOUS | 3 refills | Status: AC
  Filled 2023-11-01 – 2023-11-12 (×2): qty 3, 28d supply, fill #0
  Filled 2023-12-10: qty 3, 28d supply, fill #1
  Filled 2024-01-06: qty 3, 28d supply, fill #2
  Filled 2024-02-04: qty 3, 28d supply, fill #3
  Filled 2024-02-28: qty 3, 28d supply, fill #4
  Filled 2024-03-27: qty 3, 28d supply, fill #5
  Filled 2024-04-24: qty 3, 28d supply, fill #6
  Filled 2024-05-27: qty 3, 28d supply, fill #7
  Filled 2024-06-28: qty 3, 28d supply, fill #8
  Filled 2024-07-26: qty 3, 28d supply, fill #9
  Filled 2024-08-21 – 2024-08-22 (×2): qty 3, 28d supply, fill #10
  Filled 2024-09-22: qty 3, 28d supply, fill #11

## 2023-11-01 MED ORDER — DAPAGLIFLOZIN PROPANEDIOL 10 MG PO TABS
10.0000 mg | ORAL_TABLET | Freq: Every day | ORAL | 3 refills | Status: AC
  Filled 2023-11-01: qty 90, 90d supply, fill #0
  Filled 2024-04-24: qty 90, 90d supply, fill #1
  Filled 2024-07-26: qty 90, 90d supply, fill #2

## 2023-11-01 MED ORDER — TRESIBA FLEXTOUCH 200 UNIT/ML ~~LOC~~ SOPN
60.0000 [IU] | PEN_INJECTOR | Freq: Every day | SUBCUTANEOUS | 3 refills | Status: AC
  Filled 2023-11-01: qty 30, 90d supply, fill #0
  Filled 2024-02-19: qty 30, 90d supply, fill #1
  Filled 2024-10-07: qty 30, 90d supply, fill #2

## 2023-11-01 NOTE — Patient Instructions (Addendum)
Please use the following regimen: - Farxiga 10 mg before b'fast - Humalog 15 min before meals 30 units  Increase: - Tresiba 60 units daily (try the upper thighs) - Ozempic 2 mg weekly  Please continue Levothyroxine 100 mcg daily.  Take the thyroid hormone every day, with water, at least 30 minutes before breakfast, separated by at least 4 hours from: - acid reflux medications - calcium - iron - multivitamins  Please return in 1.5 months.

## 2023-11-04 ENCOUNTER — Other Ambulatory Visit (HOSPITAL_COMMUNITY): Payer: Self-pay

## 2023-11-06 NOTE — Telephone Encounter (Signed)
 Pharmacy Patient Advocate Encounter  Received notification from [facility] that Prior Authorization for Marcelline Deist has been APPROVED through 10/30/24   PA #/Case ID/Reference #: [id number]

## 2023-11-11 ENCOUNTER — Other Ambulatory Visit: Payer: Self-pay

## 2023-11-11 ENCOUNTER — Other Ambulatory Visit (HOSPITAL_COMMUNITY): Payer: Self-pay

## 2023-11-12 ENCOUNTER — Other Ambulatory Visit (HOSPITAL_COMMUNITY): Payer: Self-pay

## 2023-11-12 ENCOUNTER — Other Ambulatory Visit: Payer: Self-pay

## 2023-11-14 ENCOUNTER — Encounter: Payer: Self-pay | Admitting: Pharmacist

## 2023-11-14 ENCOUNTER — Other Ambulatory Visit: Payer: Self-pay

## 2023-11-14 ENCOUNTER — Other Ambulatory Visit (HOSPITAL_COMMUNITY): Payer: Self-pay

## 2023-11-14 MED ORDER — PROMETHAZINE-DM 6.25-15 MG/5ML PO SYRP
5.0000 mL | ORAL_SOLUTION | Freq: Four times a day (QID) | ORAL | 0 refills | Status: DC | PRN
Start: 2023-11-14 — End: 2024-06-01
  Filled 2023-11-14 (×2): qty 120, 6d supply, fill #0

## 2023-11-15 ENCOUNTER — Other Ambulatory Visit: Payer: Self-pay

## 2023-11-15 ENCOUNTER — Ambulatory Visit: Payer: Medicaid Other | Admitting: Internal Medicine

## 2023-11-15 ENCOUNTER — Encounter: Payer: Self-pay | Admitting: Internal Medicine

## 2023-11-15 ENCOUNTER — Other Ambulatory Visit (HOSPITAL_COMMUNITY): Payer: Self-pay

## 2023-11-15 VITALS — BP 130/78 | HR 102 | Temp 97.8°F | Wt 191.0 lb

## 2023-11-15 DIAGNOSIS — J069 Acute upper respiratory infection, unspecified: Secondary | ICD-10-CM

## 2023-11-15 DIAGNOSIS — L810 Postinflammatory hyperpigmentation: Secondary | ICD-10-CM

## 2023-11-15 DIAGNOSIS — K219 Gastro-esophageal reflux disease without esophagitis: Secondary | ICD-10-CM | POA: Diagnosis not present

## 2023-11-15 DIAGNOSIS — J302 Other seasonal allergic rhinitis: Secondary | ICD-10-CM

## 2023-11-15 DIAGNOSIS — J3089 Other allergic rhinitis: Secondary | ICD-10-CM | POA: Diagnosis not present

## 2023-11-15 MED ORDER — MONTELUKAST SODIUM 10 MG PO TABS
10.0000 mg | ORAL_TABLET | Freq: Every day | ORAL | 1 refills | Status: DC
Start: 2023-11-15 — End: 2024-06-01
  Filled 2023-11-15: qty 34, 34d supply, fill #0

## 2023-11-15 MED ORDER — AZELASTINE HCL 0.1 % NA SOLN
2.0000 | Freq: Two times a day (BID) | NASAL | 5 refills | Status: AC | PRN
  Filled 2023-11-15: qty 30, 34d supply, fill #0

## 2023-11-15 MED ORDER — HYDROCORTISONE 2.5 % EX CREA
TOPICAL_CREAM | CUTANEOUS | 0 refills | Status: DC
  Filled 2023-11-15: qty 30, 14d supply, fill #0

## 2023-11-15 MED ORDER — FLUTICASONE PROPIONATE 50 MCG/ACT NA SUSP
1.0000 | Freq: Every day | NASAL | 5 refills | Status: AC
Start: 2023-11-15 — End: ?
  Filled 2023-11-15: qty 16, 34d supply, fill #0
  Filled 2024-07-14 – 2024-07-15 (×2): qty 16, 34d supply, fill #1

## 2023-11-15 MED ORDER — CETIRIZINE HCL 10 MG PO TABS
10.0000 mg | ORAL_TABLET | Freq: Every day | ORAL | 1 refills | Status: AC
  Filled 2023-11-15: qty 34, 34d supply, fill #0
  Filled 2024-10-07: qty 34, 34d supply, fill #1

## 2023-11-15 NOTE — Patient Instructions (Addendum)
 Viral URI:  Viral Upper Respiratory Infections Antibiotics only work on bacterial infections. Because URIs usually are caused by viruses, antibiotics are not an effective treatment.   Nasal congestion, post nasal dripping, or sinus pressure:   Nasal rinsing with saline nasal spray or salt water (e.g., Neilmed Sinus Rinse bottle) can help relieve nasal dryness.   Use a warm mist humidifier or take a steamy shower to increase moisture in the air and soothe oral and nasal tissues that become irritated with dry air.   Sore throat:  Use a pain reliever, such as acetaminophen (Tylenol) or ibuprofen (Advil).   Gargle with salt water (1/4 tsp of salt dissolved in 8 oz of warm water). Salt water can be used as often as you like.   Throat lozenges or throat sprays (Cepacol lozenges or Chloroseptic spray) may bring some relief by increasing saliva production and lubricating your throat.   Drink plenty of fluids (e.g., water, diluted juice, tea) to soothe your throat and to stay well hydrated.   Coughing:   Medications that contain dextromethorphan (e.g., Robitussin DM, Mucinex DM, Delsym) may help to suppress a cough.   Tea with honey, when taken regularly, can soothe a sore throat and help suppress a cough. Take care with medications   Strengthen your immune system:   Make sure you eat well (e.g., a healthy, balanced variety of foods).   Avoid alcohol as it can directly suppress various immune responses.   Stay away from cigarettes, and second hand smoke.   Rest as much as possible and get plenty of sleep (at least 8 hours).     Post Inflammatory Hyperpigmentation - Apply vaseline multiple times a day to the upper arm. - Use hydrocortisone 2.5% twice daily.  Maximum 14 days.    Allergic Rhinitis - SPT 09/2023: positive to trees, grasses, weeds, molds, dust mites, cats, dogs, cockroach  - Use nasal saline rinses before nose sprays such as with Neilmed Sinus Rinse.  Use distilled water.   - Use  Flonase 2 sprays each nostril every other day. Aim upward and outward.  - Use Azelastine 1-2 sprays each nostril twice daily as needed for runny nose, drainage, sneezing, congestion. Aim upward and outward. - Use Zyrtec 10 mg daily.  - Use Singulair 10mg  daily.  Stop if there are any mood/behavioral changes. - Consider allergy shots as long term control of your symptoms by teaching your immune system to be more tolerant of your allergy triggers  GERD - Use Omeprazole 20mg  daily on an empty stomach for at least 6 weeks.  Eat a small meal about 45 minutes after.   -Avoid lying down for at least two hours after a meal or after drinking acidic beverages, like soda, or other caffeinated beverages. This can help to prevent stomach contents from flowing back into the esophagus. -Keep your head elevated while you sleep. Using an extra pillow or two can also help to prevent reflux. -Eat smaller and more frequent meals each day instead of a few large meals. This promotes digestion and can aid in preventing heartburn. -Wear loose-fitting clothes to ease pressure on the stomach, which can worsen heartburn and reflux. -Reduce excess weight around the midsection. This can ease pressure on the stomach. Such pressure can force some stomach contents back up the esophagus.   Chronic Cough/Dypsnea  - Continue follow up with Pulm. - Remember to use the inhalers as prescribed: Wixela (Fluticasone-Salmeterol) 100-45mcg 1 puff twice daily.  Brush and gargle after use.

## 2023-11-15 NOTE — Progress Notes (Addendum)
 FOLLOW UP Date of Service/Encounter:  11/15/23   Subjective:  Samantha [name] (DOB: [birth year]) is a 58 y.o. female who [facility] for follow up for allergic rhinitis and GERD.   History obtained from: chart review and patient. Last seen on 09/13/2023 for skin testing and was positive to multiple aeroallergens.  Started on Flonase, Azelastine PRN, Zyrtec, Singulair. Also on PPI for GERD.  Followed by Pulm for cough/dyspnea on Wixela.   Since Tuesday, has been having trouble with cough, congestion, drainage; was having trouble with fevers also but that has resolved.  Went to urgent care and was COVID/Flu/RSV negative and told she has a viral URI.   Not sure how her allergies are doing since the viral infection is worsening her sinus symptoms.  Using nose sprays PRN, Zyrtec and Singulair daily.  No severe sinus pressure, no purulent drainage from nose/sinuses, no fevers.  Also notes having darkening of skin in the areas we did the intradermal.   Reflux is doing okay on PPI.   Past Medical History: Past Medical History:  Diagnosis Date   Anemia    Anxiety    Arthritis    Chest pain of uncertain etiology    Depression    Diabetes mellitus without complication (HCC)    Type 2   Dyspnea    Hyperlipidemia    Hypothyroidism    Palpitations    Pneumonia    Thyroid disease    Urticaria    Vitamin D deficiency     Objective:  BP 130/78 (BP Location: Right Arm, Patient Position: Sitting, Cuff Size: Normal)   Pulse (!) 102   Temp 97.8 F (36.6 C) (Temporal)   Wt 191 lb (86.6 kg)   LMP 07/10/2012   SpO2 97%   BMI 31.82 kg/m  Body mass index is 31.82 kg/m. Physical Exam: GEN: alert, well developed HEENT: clear conjunctiva, nose with mild inferior turbinate hypertrophy, pink nasal mucosa, + clear rhinorrhea HEART: regular rate and rhythm, no murmur LUNGS: clear to auscultation bilaterally, no coughing, unlabored respiration SKIN:  hyperpigmented round areas on R upper arm   Assessment:   1. Viral URI with cough   2. Seasonal and perennial allergic rhinitis   3. Gastroesophageal reflux disease without esophagitis   4. Post-inflammatory hyperpigmentation     Plan/Recommendations:  Viral URI:  Viral Upper Respiratory Infections Antibiotics only work on bacterial infections. Because URIs usually are caused by viruses, antibiotics are not an effective treatment.   Nasal congestion, post nasal dripping, or sinus pressure:   Nasal rinsing with saline nasal spray or salt water (e.g., Neilmed Sinus Rinse bottle) can help relieve nasal dryness.   Use a warm mist humidifier or take a steamy shower to increase moisture in the air and soothe oral and nasal tissues that become irritated with dry air.   Sore throat:  Use a pain reliever, such as acetaminophen (Tylenol) or ibuprofen (Advil).   Gargle with salt water (1/4 tsp of salt dissolved in 8 oz of warm water). Salt water can be used as often as you like.   Throat lozenges or throat sprays (Cepacol lozenges or Chloroseptic spray) may bring some relief by increasing saliva production and lubricating your throat.   Drink plenty of fluids (e.g., water, diluted juice, tea) to soothe your throat and to stay well hydrated.   Coughing:   Medications that contain dextromethorphan (e.g., Robitussin DM, Mucinex DM, Delsym) may help to suppress a cough.  Tea with honey, when taken regularly, can soothe a sore throat and help suppress a cough. Take care with medications   Strengthen your immune system:   Make sure you eat well (e.g., a healthy, balanced variety of foods).   Avoid alcohol as it can directly suppress various immune responses.   Stay away from cigarettes, and second hand smoke.   Rest as much as possible and get plenty of sleep (at least 8 hours).   Post Inflammatory Hyperpigmentation- Intradermals  - Apply vaseline multiple times a day to the upper arm. - Use  hydrocortisone 2.5% twice daily.  Maximum 14 days.   Allergic Rhinitis - SPT 09/2023: positive to trees, grasses, weeds, molds, dust mites, cats, dogs, cockroach  - Use nasal saline rinses before nose sprays such as with Neilmed Sinus Rinse.  Use distilled water.   - Use Flonase 2 sprays each nostril every other day. Aim upward and outward.  - Use Azelastine 1-2 sprays each nostril twice daily as needed for runny nose, drainage, sneezing, congestion. Aim upward and outward. - Use Zyrtec 10 mg daily.  - Use Singulair 10mg  daily.  Stop if there are any mood/behavioral changes. - Consider allergy shots as long term control of your symptoms by teaching your immune system to be more tolerant of your allergy triggers  GERD - Use Omeprazole 20mg  daily on an empty stomach for at least 6 weeks.  Eat a small meal about 45 minutes after.   -Avoid lying down for at least two hours after a meal or after drinking acidic beverages, like soda, or other caffeinated beverages. This can help to prevent stomach contents from flowing back into the esophagus. -Keep your head elevated while you sleep. Using an extra pillow or two can also help to prevent reflux. -Eat smaller and more frequent meals each day instead of a few large meals. This promotes digestion and can aid in preventing heartburn. -Wear loose-fitting clothes to ease pressure on the stomach, which can worsen heartburn and reflux. -Reduce excess weight around the midsection. This can ease pressure on the stomach. Such pressure can force some stomach contents back up the esophagus.   Chronic Cough/Dypsnea  - Continue follow up with Pulm. - Remember to use the inhalers as prescribed: Wixela (Fluticasone-Salmeterol) 100-74mcg 1 puff twice daily.  Brush and gargle after use.       Return in about 6 months (around 05/17/2024).  Alesia Morin, MD [facility]

## 2023-11-21 ENCOUNTER — Other Ambulatory Visit (HOSPITAL_COMMUNITY): Payer: Self-pay

## 2023-12-03 ENCOUNTER — Other Ambulatory Visit (HOSPITAL_COMMUNITY): Payer: Self-pay

## 2023-12-10 ENCOUNTER — Other Ambulatory Visit (HOSPITAL_COMMUNITY): Payer: Self-pay

## 2023-12-23 ENCOUNTER — Other Ambulatory Visit: Payer: Self-pay

## 2023-12-23 ENCOUNTER — Other Ambulatory Visit (HOSPITAL_COMMUNITY): Payer: Self-pay

## 2023-12-25 ENCOUNTER — Ambulatory Visit (INDEPENDENT_AMBULATORY_CARE_PROVIDER_SITE_OTHER): Payer: Medicaid Other | Admitting: Internal Medicine

## 2023-12-25 ENCOUNTER — Encounter: Payer: Self-pay | Admitting: Internal Medicine

## 2023-12-25 VITALS — BP 120/80 | HR 92 | Ht 64.96 in | Wt 188.6 lb

## 2023-12-25 DIAGNOSIS — Z7984 Long term (current) use of oral hypoglycemic drugs: Secondary | ICD-10-CM

## 2023-12-25 DIAGNOSIS — Z7985 Long-term (current) use of injectable non-insulin antidiabetic drugs: Secondary | ICD-10-CM | POA: Diagnosis not present

## 2023-12-25 DIAGNOSIS — E782 Mixed hyperlipidemia: Secondary | ICD-10-CM

## 2023-12-25 DIAGNOSIS — E1165 Type 2 diabetes mellitus with hyperglycemia: Secondary | ICD-10-CM | POA: Diagnosis not present

## 2023-12-25 DIAGNOSIS — E11311 Type 2 diabetes mellitus with unspecified diabetic retinopathy with macular edema: Secondary | ICD-10-CM

## 2023-12-25 DIAGNOSIS — E890 Postprocedural hypothyroidism: Secondary | ICD-10-CM

## 2023-12-25 LAB — POCT GLYCOSYLATED HEMOGLOBIN (HGB A1C): Hemoglobin A1C: 7.7 % — AB (ref 4.0–5.6)
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
## 2023-12-25 NOTE — Patient Instructions (Addendum)
 Please use the following regimen: - Farxiga 10 mg before b'fast - Tresiba 60 units daily (upper thighs) - Ozempic 2 mg weekly  Change:  - Humalog 15 min before meals 24-34 units  Please continue Levothyroxine 100 mcg daily.  Take the thyroid hormone every day, with water, at least 30 minutes before breakfast, separated by at least 4 hours from: - acid reflux medications - calcium - iron - multivitamins  Please return in 3 months.

## 2023-12-26 ENCOUNTER — Encounter: Payer: Self-pay | Admitting: Internal Medicine

## 2023-12-26 LAB — LIPID PANEL W/REFLEX DIRECT LDL
Cholesterol: 155 mg/dL (ref <200)
HDL: 36 mg/dL — ABNORMAL LOW (ref >=50)
LDL Cholesterol (Calc): 90 mg/dL
Non-HDL Cholesterol (Calc): 119 mg/dL (ref <130)
Total CHOL/HDL Ratio: 4.3 (calc) (ref <5.0)
Triglycerides: 195 mg/dL — ABNORMAL HIGH (ref <150)

## 2023-12-26 LAB — MICROALBUMIN / CREATININE URINE RATIO
Creatinine, Urine: 176 mg/dL (ref 20–275)
Microalb Creat Ratio: 5 mg/g{creat} (ref <30)
Microalb, Ur: 0.8 mg/dL

## 2023-12-26 LAB — TSH: TSH: 1.22 m[IU]/L (ref 0.40–4.50)

## 2023-12-30 ENCOUNTER — Other Ambulatory Visit (HOSPITAL_COMMUNITY): Payer: Self-pay

## 2023-12-31 ENCOUNTER — Other Ambulatory Visit: Payer: Self-pay

## 2023-12-31 LAB — HM DIABETES EYE EXAM

## 2024-01-06 ENCOUNTER — Other Ambulatory Visit: Payer: Self-pay

## 2024-01-06 ENCOUNTER — Other Ambulatory Visit: Payer: Self-pay | Admitting: Internal Medicine

## 2024-01-06 ENCOUNTER — Other Ambulatory Visit (HOSPITAL_COMMUNITY): Payer: Self-pay

## 2024-01-08 ENCOUNTER — Other Ambulatory Visit (HOSPITAL_COMMUNITY): Payer: Self-pay

## 2024-01-08 ENCOUNTER — Other Ambulatory Visit: Payer: Self-pay

## 2024-01-08 MED ORDER — LEVOTHYROXINE SODIUM 100 MCG PO TABS
100.0000 ug | ORAL_TABLET | Freq: Every day | ORAL | 1 refills | Status: DC
  Filled 2024-01-08: qty 90, 90d supply, fill #0
  Filled 2024-04-08: qty 90, 90d supply, fill #1

## 2024-01-08 MED ORDER — PENTIPS GENERIC PEN NEEDLES 32G X 4 MM MISC
3 refills | Status: AC
Start: 2024-01-08 — End: ?
  Filled 2024-01-08: qty 400, 80d supply, fill #0
  Filled 2024-04-24: qty 400, 80d supply, fill #1

## 2024-01-19 ENCOUNTER — Other Ambulatory Visit (HOSPITAL_COMMUNITY): Payer: Self-pay

## 2024-01-20 ENCOUNTER — Other Ambulatory Visit (HOSPITAL_COMMUNITY): Payer: Self-pay

## 2024-01-21 ENCOUNTER — Other Ambulatory Visit: Payer: Self-pay

## 2024-01-21 ENCOUNTER — Other Ambulatory Visit (HOSPITAL_COMMUNITY): Payer: Self-pay

## 2024-01-21 MED ORDER — ROSUVASTATIN CALCIUM 5 MG PO TABS
5.0000 mg | ORAL_TABLET | Freq: Every day | ORAL | 0 refills | Status: DC
  Filled 2024-01-21: qty 90, 90d supply, fill #0

## 2024-02-05 ENCOUNTER — Other Ambulatory Visit: Payer: Self-pay

## 2024-02-05 ENCOUNTER — Other Ambulatory Visit (HOSPITAL_COMMUNITY): Payer: Self-pay

## 2024-02-05 MED ORDER — SERTRALINE HCL 100 MG PO TABS
100.0000 mg | ORAL_TABLET | Freq: Every day | ORAL | 1 refills | Status: AC
  Filled 2024-02-05: qty 90, 90d supply, fill #0

## 2024-02-05 MED ORDER — ROSUVASTATIN CALCIUM 5 MG PO TABS
5.0000 mg | ORAL_TABLET | Freq: Every day | ORAL | 3 refills | Status: AC
  Filled 2024-02-05 – 2024-06-28 (×2): qty 90, 90d supply, fill #0

## 2024-02-05 MED ORDER — TRIAMCINOLONE ACETONIDE 55 MCG/ACT NA AERO
2.0000 | INHALATION_SPRAY | Freq: Every day | NASAL | 3 refills | Status: AC | PRN
  Filled 2024-02-05: qty 16.9, 30d supply, fill #0

## 2024-02-05 MED ORDER — ALBUTEROL SULFATE HFA 108 (90 BASE) MCG/ACT IN AERS
2.0000 | INHALATION_SPRAY | RESPIRATORY_TRACT | 1 refills | Status: DC | PRN
  Filled 2024-02-05: qty 18, 17d supply, fill #0
  Filled 2024-05-15 – 2024-05-18 (×2): qty 18, 17d supply, fill #1

## 2024-02-05 MED ORDER — MONTELUKAST SODIUM 10 MG PO TABS
10.0000 mg | ORAL_TABLET | Freq: Every day | ORAL | 3 refills | Status: AC | PRN
  Filled 2024-02-05: qty 90, 90d supply, fill #0

## 2024-02-06 ENCOUNTER — Other Ambulatory Visit: Payer: Self-pay | Admitting: Family Medicine

## 2024-02-06 DIAGNOSIS — Z1231 Encounter for screening mammogram for malignant neoplasm of breast: Secondary | ICD-10-CM

## 2024-02-10 ENCOUNTER — Telehealth: Payer: Self-pay | Admitting: Internal Medicine

## 2024-02-10 ENCOUNTER — Telehealth: Payer: Self-pay

## 2024-02-10 ENCOUNTER — Ambulatory Visit
Admission: RE | Admit: 2024-02-10 | Discharge: 2024-02-10 | Disposition: A | Discharge status: Home / Self Care | Source: Ambulatory Visit | Attending: Family Medicine | Admitting: Family Medicine

## 2024-02-10 DIAGNOSIS — Z1231 Encounter for screening mammogram for malignant neoplasm of breast: Secondary | ICD-10-CM

## 2024-02-10 MED ORDER — DEXCOM G7 SENSOR MISC: Status: DC

## 2024-02-10 NOTE — Telephone Encounter (Signed)
 Dexcom sample has been documented.

## 2024-02-10 NOTE — Telephone Encounter (Signed)
 Medication Samples have been provided to the patient.  Drug name: Dexcom       Strength: N/A       Qty: 1  LOT: [id number]  Exp.Date: 06/09/2025  Dosing instructions: Change every 10 days  The patient has been instructed regarding the correct time, dose, and frequency of taking this medication, including desired effects and most common side effects.   Samantha [name] 4:51 PM 02/10/2024

## 2024-02-10 NOTE — Telephone Encounter (Signed)
 Patient came in to office and picked up one sample of Dexcom G7 sensor.

## 2024-02-17 ENCOUNTER — Other Ambulatory Visit (HOSPITAL_COMMUNITY): Payer: Self-pay

## 2024-02-17 ENCOUNTER — Other Ambulatory Visit: Payer: Self-pay | Admitting: Internal Medicine

## 2024-02-20 ENCOUNTER — Other Ambulatory Visit: Payer: Self-pay

## 2024-02-20 ENCOUNTER — Telehealth: Payer: Self-pay

## 2024-02-20 ENCOUNTER — Other Ambulatory Visit (HOSPITAL_COMMUNITY): Payer: Self-pay

## 2024-02-20 MED ORDER — DEXCOM G7 SENSOR MISC
3 refills | Status: AC
  Filled 2024-02-20 (×2): qty 3, 30d supply, fill #0
  Filled 2024-03-17: qty 3, 30d supply, fill #1
  Filled 2024-04-08 – 2024-04-14 (×2): qty 3, 30d supply, fill #2
  Filled 2024-05-15 – 2024-05-18 (×2): qty 3, 30d supply, fill #3
  Filled 2024-06-17: qty 3, 30d supply, fill #4
  Filled 2024-07-14: qty 3, 30d supply, fill #5
  Filled 2024-08-13: qty 3, 30d supply, fill #6
  Filled 2024-09-05: qty 1, 10d supply, fill #7
  Filled 2024-09-09: qty 3, 30d supply, fill #7
  Filled 2024-10-13: qty 3, 30d supply, fill #8

## 2024-02-20 NOTE — Telephone Encounter (Signed)
 Pharmacy Patient Advocate Encounter   Received notification from CoverMyMeds that prior authorization for Dexcom G7 sensor is required/requested.   Insurance verification completed.   The patient is insured through [facility] .   Per test claim: PA required; PA submitted to above mentioned insurance via CoverMyMeds Key/confirmation #/EOC [id number] Status is pending

## 2024-02-21 ENCOUNTER — Other Ambulatory Visit (HOSPITAL_COMMUNITY): Payer: Self-pay

## 2024-02-27 ENCOUNTER — Other Ambulatory Visit (HOSPITAL_COMMUNITY): Payer: Self-pay

## 2024-02-28 ENCOUNTER — Other Ambulatory Visit (HOSPITAL_COMMUNITY): Payer: Self-pay

## 2024-03-09 NOTE — Telephone Encounter (Signed)
 Pharmacy Patient Advocate Encounter  Received notification from [employer] that Prior Authorization for Dexcom G7 sensor has been APPROVED from 02/20/24 to 02/19/25   PA #/Case ID/Reference #:  [id number]

## 2024-03-17 ENCOUNTER — Other Ambulatory Visit: Payer: Self-pay

## 2024-03-17 ENCOUNTER — Other Ambulatory Visit (HOSPITAL_COMMUNITY): Payer: Self-pay

## 2024-03-25 ENCOUNTER — Ambulatory Visit: Admitting: Internal Medicine

## 2024-03-27 ENCOUNTER — Other Ambulatory Visit (HOSPITAL_COMMUNITY): Payer: Self-pay

## 2024-04-08 ENCOUNTER — Other Ambulatory Visit (HOSPITAL_COMMUNITY): Payer: Self-pay

## 2024-04-08 ENCOUNTER — Other Ambulatory Visit: Payer: Self-pay

## 2024-04-13 ENCOUNTER — Encounter: Payer: Self-pay | Admitting: Internal Medicine

## 2024-04-13 ENCOUNTER — Ambulatory Visit (INDEPENDENT_AMBULATORY_CARE_PROVIDER_SITE_OTHER): Admitting: Internal Medicine

## 2024-04-13 VITALS — BP 124/70 | HR 104 | Ht 64.96 in | Wt 188.4 lb

## 2024-04-13 DIAGNOSIS — E11311 Type 2 diabetes mellitus with unspecified diabetic retinopathy with macular edema: Secondary | ICD-10-CM

## 2024-04-13 DIAGNOSIS — E1165 Type 2 diabetes mellitus with hyperglycemia: Secondary | ICD-10-CM | POA: Diagnosis not present

## 2024-04-13 DIAGNOSIS — E890 Postprocedural hypothyroidism: Secondary | ICD-10-CM

## 2024-04-13 DIAGNOSIS — Z7984 Long term (current) use of oral hypoglycemic drugs: Secondary | ICD-10-CM

## 2024-04-13 DIAGNOSIS — Z7985 Long-term (current) use of injectable non-insulin antidiabetic drugs: Secondary | ICD-10-CM

## 2024-04-13 DIAGNOSIS — Z794 Long term (current) use of insulin: Secondary | ICD-10-CM

## 2024-04-13 LAB — POCT GLYCOSYLATED HEMOGLOBIN (HGB A1C): Hemoglobin A1C: 6.6 % — AB (ref 4.0–5.6)

## 2024-04-13 NOTE — Patient Instructions (Addendum)
 Please use the following regimen: - Farxiga  10 mg before b'fast - Ozempic  2 mg weekly - Tresiba  60 units daily (upper thighs) - Humalog  15 min before meals 24-34 units  Please continue Levothyroxine  100 mcg daily.  Take the thyroid  hormone every day, with water, at least 30 minutes before breakfast, separated by at least 4 hours from: - acid reflux medications - calcium  - iron - multivitamins  Please return in 3-4 months.
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
## 2024-04-14 ENCOUNTER — Other Ambulatory Visit (HOSPITAL_COMMUNITY): Payer: Self-pay

## 2024-04-15 ENCOUNTER — Other Ambulatory Visit (HOSPITAL_COMMUNITY): Payer: Self-pay

## 2024-04-18 ENCOUNTER — Other Ambulatory Visit: Payer: Self-pay | Admitting: Internal Medicine

## 2024-04-21 ENCOUNTER — Other Ambulatory Visit (HOSPITAL_COMMUNITY): Payer: Self-pay

## 2024-04-21 ENCOUNTER — Other Ambulatory Visit: Payer: Self-pay

## 2024-04-21 MED ORDER — HUMALOG KWIKPEN 200 UNIT/ML ~~LOC~~ SOPN
25.0000 [IU] | PEN_INJECTOR | Freq: Three times a day (TID) | SUBCUTANEOUS | 3 refills | Status: AC
  Filled 2024-04-21: qty 18, 40d supply, fill #0
  Filled 2024-06-09: qty 18, 20d supply, fill #1
  Filled 2024-06-12 – 2024-06-15 (×3): qty 18, 40d supply, fill #1
  Filled 2024-09-09: qty 18, 40d supply, fill #2

## 2024-04-24 ENCOUNTER — Other Ambulatory Visit: Payer: Self-pay

## 2024-04-24 ENCOUNTER — Other Ambulatory Visit (HOSPITAL_COMMUNITY): Payer: Self-pay

## 2024-04-27 ENCOUNTER — Other Ambulatory Visit: Payer: Self-pay

## 2024-05-13 ENCOUNTER — Other Ambulatory Visit (HOSPITAL_COMMUNITY): Payer: Self-pay

## 2024-05-13 MED ORDER — PAXLOVID (300/100) 20 X 150 MG & 10 X 100MG PO TBPK
3.0000 | ORAL_TABLET | Freq: Two times a day (BID) | ORAL | 0 refills | Status: AC
  Filled 2024-05-13 (×2): qty 30, 5d supply, fill #0

## 2024-05-14 ENCOUNTER — Other Ambulatory Visit: Payer: Self-pay

## 2024-05-16 ENCOUNTER — Other Ambulatory Visit (HOSPITAL_COMMUNITY): Payer: Self-pay

## 2024-05-18 ENCOUNTER — Other Ambulatory Visit: Payer: Self-pay

## 2024-05-18 ENCOUNTER — Other Ambulatory Visit (HOSPITAL_COMMUNITY): Payer: Self-pay

## 2024-05-20 ENCOUNTER — Ambulatory Visit: Admitting: Internal Medicine

## 2024-05-22 ENCOUNTER — Other Ambulatory Visit (HOSPITAL_COMMUNITY): Payer: Self-pay

## 2024-05-22 MED ORDER — PREDNISONE 20 MG PO TABS
ORAL_TABLET | ORAL | 0 refills | Status: AC
  Filled 2024-05-22: qty 9, 6d supply, fill #0

## 2024-05-27 ENCOUNTER — Other Ambulatory Visit (HOSPITAL_COMMUNITY): Payer: Self-pay

## 2024-05-29 ENCOUNTER — Ambulatory Visit: Payer: Self-pay | Admitting: Acute Care

## 2024-05-29 NOTE — Telephone Encounter (Signed)
 FYI Only or Action Required?: FYI only for provider.scheduled for an appointment Monday 06/01/2024  Patient is followed in Pulmonology for SOB, last seen on 03/15/2023 by Ruthell Lauraine FALCON, NP.  Called Nurse Triage reporting Cough.  Symptoms began several weeks ago.  Interventions attempted: Rescue inhaler and Increased fluids/rest.  Symptoms are: unchanged.  Triage Disposition: See PCP When Office is Open (Within 3 Days)  Patient/caregiver understands and will follow disposition?: Yes  Copied from CRM [id number]. Topic: Clinical - Red Word Triage >> May 29, 2024  1:25 PM Abigail D wrote: Red Word that prompted transfer to Nurse Triage: Covid 2-3 weeks ago and took paxlovid , went back to PCP last friday and was given prednisone  and it helped but now it ran out - she is having some pain in her chest still. Coughing up a grayish colored phlegm. She said her chest x-ray was clear for pneumonia.    ----------------------------------------------------------------------- From previous Reason for Contact - Scheduling: Patient/patient representative is calling to schedule an appointment. Refer to attachments for appointment information. Reason for Disposition  Cough has been present for > 3 weeks  Answer Assessment - Initial Assessment Questions Patient was COVID positive 2-3 weeks ago and completed Paxlovid . Patient reports she was seen at PCP last Friday and given steroids to help with continued symptoms. Patient reports continued cough with grayish color mucus. Patient is asking to be seen in pulmonary office. Scheduled for an acute visit on Monday 06/01/2024  1. ONSET: When did the cough begin?      Started 2-3 weeks ago when she tested positive for COVID 2. SEVERITY: How bad is the cough today?      Mild currently 3. SPUTUM: Describe the color of your sputum (e.g., none, dry cough; clear, white, yellow, green)     Grayish colored mucus 4. HEMOPTYSIS: Are you coughing up any blood? If  Yes, ask: How much? (e.g., flecks, streaks, tablespoons, etc.)     no 5. DIFFICULTY BREATHING: Are you having difficulty breathing? If Yes, ask: How bad is it? (e.g., mild, moderate, severe)      No but states she feels like her breathing is labored.  6. FEVER: Do you have a fever? If Yes, ask: What is your temperature, how was it measured, and when did it start?     no 7. CARDIAC HISTORY: Do you have any history of heart disease? (e.g., heart attack, congestive heart failure)      no 8. LUNG HISTORY: Do you have any history of lung disease?  (e.g., pulmonary embolus, asthma, emphysema)     no 9. PE RISK FACTORS: Do you have a history of blood clots? (or: recent major surgery, recent prolonged travel, bedridden)     no 10. OTHER SYMPTOMS: Do you have any other symptoms? (e.g., runny nose, wheezing, chest pain)       Chest tightness 12. TRAVEL: Have you traveled out of the country in the last month? (e.g., travel history, exposures)       no  Protocols used: Cough - Acute Productive-A-AH

## 2024-06-01 ENCOUNTER — Other Ambulatory Visit (HOSPITAL_COMMUNITY): Payer: Self-pay

## 2024-06-01 ENCOUNTER — Encounter: Payer: Self-pay | Admitting: Acute Care

## 2024-06-01 ENCOUNTER — Other Ambulatory Visit: Payer: Self-pay

## 2024-06-01 ENCOUNTER — Ambulatory Visit: Admitting: Acute Care

## 2024-06-01 VITALS — BP 133/85 | HR 78 | Temp 98.5°F | Ht 65.5 in | Wt 185.8 lb

## 2024-06-01 DIAGNOSIS — J069 Acute upper respiratory infection, unspecified: Secondary | ICD-10-CM

## 2024-06-01 DIAGNOSIS — R053 Chronic cough: Secondary | ICD-10-CM | POA: Diagnosis not present

## 2024-06-01 MED ORDER — VENTOLIN HFA 108 (90 BASE) MCG/ACT IN AERS
1.0000 | INHALATION_SPRAY | Freq: Four times a day (QID) | RESPIRATORY_TRACT | 3 refills | Status: AC | PRN
  Filled 2024-06-01: qty 18, 30d supply, fill #0

## 2024-06-01 MED ORDER — MOMETASONE FURO-FORMOTEROL FUM 100-5 MCG/ACT IN AERO
2.0000 | INHALATION_SPRAY | Freq: Two times a day (BID) | RESPIRATORY_TRACT | 3 refills | Status: AC
  Filled 2024-06-01 – 2024-07-03 (×3): qty 13, 30d supply, fill #0
  Filled 2024-08-13: qty 13, 30d supply, fill #1

## 2024-06-01 MED ORDER — DOXYCYCLINE HYCLATE 100 MG PO TABS
100.0000 mg | ORAL_TABLET | Freq: Two times a day (BID) | ORAL | 0 refills | Status: DC
  Filled 2024-06-01: qty 14, 7d supply, fill #0

## 2024-06-01 NOTE — Patient Instructions (Signed)
 I have sent in a prescription for Dulera . This is a maintenance medication. Use every day without fail regardless of how you feel.  Use 2 puffs in the morning and 2 puffs in the evening Rinse mouth after use. Sips of water instead of throat clearing Sugar Free Coca-Cola or Werther's originals for throat soothing. Delsym Cough syrup 5 cc's every 12 hours Non-sedating antihistamine of your choice daily ( Zyrtec , Allegra, Xyzol, Claritin  ( Generic ok)  Start back on your omeprazole  for reflux to help with cough. You can get this over the counter. Avoid menthol in cough drops.  Follow up with PCP about swollen glands. Follow up in 1 month with Lauraine NP or Dr. Neda  to see if cough is better.  Call if you need us  sooner.  Please contact office for sooner follow up if symptoms do not improve or worsen or seek emergency care

## 2024-06-01 NOTE — Progress Notes (Signed)
 History of Present Illness Samantha [name] is a 58 y.o. female never smoker with acte cough and dyspnea. She is followed by Dr. Neda.    06/01/2024 Discussed the use of AI scribe software for clinical note transcription with the patient, who gave verbal consent to proceed.  History of Present Illness Samantha [name] is a 58 year old female with diabetes who presents with persistent cough and shortness of breath following a COVID-19 infection. She has been followed through our clinic for cough in the past, but she has not been seen since 03/2023.  She has been experiencing a persistent cough and shortness of breath since a COVID-19 infection diagnosed on May 22, 2024. The cough is productive of gray sputum, which was previously clear. Shortness of breath occurs with minimal exertion, such as talking or walking, and is accompanied by chest pain. Treatment with Paxlovid  and prednisone  provided some relief, but symptoms persist. It has only been 10 days since her diagnosis, and with her underlying diabetes and breathing issues, it may take her a bit more time  to return to baseline.  She has a history of using Wixela (two puffs in the morning and evening) and albuterol  as a rescue inhaler. However, she stopped using Wixela approximately eight to nine months ago and has been relying solely on albuterol . She uses her albuterol  as a maintenance. She is unsure if she has any Wixela left and has not been using any maintenance inhaler recently.  She has diabetes, which she believes may be affecting her recovery from COVID-19. She also reports swollen glands in her neck that have persisted since her COVID-19 infection, although they sometimes bother her and sometimes do not. She experienced a weight loss of five pounds during her COVID-19 illness but has not had significant weight changes since.  She is not currently taking Protonix  or omeprazole , although she was previously started on  omeprazole  for a cough potentially related to reflux. She has not been diagnosed with asthma.     Test Results: COVID 19 diagnosed 05/22/2024 Sleep Study 11/2021, AHI of less than 5 Echo 2021>> normal left and right cardiac size and pressures  PFT 04/2022 >>> Mild restriction, Minimal diffusion deficit.                Latest Ref Rng & Units 05/19/2023    1:25 AM 05/19/2023   12:51 AM 03/05/2023    9:05 AM  CBC  WBC 4.0 - 10.5 K/uL  10.3  9.4   Hemoglobin 12.0 - 15.0 g/dL 86.0  86.6  86.7   Hematocrit 36.0 - 46.0 % 41.0  40.9  39.4   Platelets 150 - 400 K/uL  328  363        Latest Ref Rng & Units 05/19/2023    1:25 AM 03/05/2023    9:05 AM 07/18/2022    2:45 PM  BMP  Glucose 70 - 99 mg/dL 843  89  875   BUN 6 - 20 mg/dL 15  17  12    Creatinine 0.44 - 1.00 mg/dL 9.09  9.17  9.16   BUN/Creat Ratio 9 - 23  21    Sodium 135 - 145 mmol/L 144  142  144   Potassium 3.5 - 5.1 mmol/L 3.7  4.3  4.1   Chloride 98 - 111 mmol/L 110  104  108   CO2 20 - 29 mmol/L  25  26   Calcium  8.7 - 10.2 mg/dL  9.4  9.3  BNP No results found for: BNP  ProBNP No results found for: PROBNP  PFT    Component Value Date/Time   FEV1PRE 1.85 05/03/2022 1302   FEV1POST 1.76 05/03/2022 1302   FVCPRE 2.22 05/03/2022 1302   FVCPOST 2.15 05/03/2022 1302   TLC 4.05 05/03/2022 1302   DLCOUNC 16.55 05/03/2022 1302   PREFEV1FVCRT 83 05/03/2022 1302   PSTFEV1FVCRT 82 05/03/2022 1302    No results found.   Past medical hx Past Medical History:  Diagnosis Date   Anemia    Anxiety    Arthritis    Chest pain of uncertain etiology    Depression    Diabetes mellitus without complication (HCC)    Type 2   Dyspnea    Hyperlipidemia    Hypothyroidism    Palpitations    Pneumonia    Thyroid  disease    Urticaria    Vitamin D deficiency      Social History   Tobacco Use   Smoking status: Never    Passive exposure: Past   Smokeless tobacco: Never  Vaping Use   Vaping status:  Never Used  Substance Use Topics   Alcohol use: No    Alcohol/week: 0.0 standard drinks of alcohol   Drug use: Never    Ms.Ricotta reports that she has never smoked. She has been exposed to tobacco smoke. She has never used smokeless tobacco. She reports that she does not drink alcohol and does not use drugs.  Tobacco Cessation: Counseling given: Not Answered Never smoker   Past surgical hx, Family hx, Social hx all reviewed.  Current Outpatient Medications on File Prior to Visit  Medication Sig   albuterol  (VENTOLIN  HFA) 108 (90 Base) MCG/ACT inhaler Inhale 2 puffs into the lungs every 4 (four) hours as needed for wheezing/shortness of breath   azelastine  (ASTELIN ) 0.1 % nasal spray Place 2 sprays into both nostrils 2 (two) times daily as needed. Use in each nostril as directed   cetirizine  (ZYRTEC  ALLERGY ) 10 MG tablet Take 1 tablet (10 mg total) by mouth daily.   cholecalciferol (VITAMIN D3) 25 MCG (1000 UNIT) tablet 1 capsule.   Continuous Glucose Sensor (DEXCOM G7 SENSOR) MISC Use to monitor glucose continuously, change sensor every 10 days   dapagliflozin  propanediol (FARXIGA ) 10 MG TABS tablet Take 1 tablet (10 mg total) by mouth daily.   fluticasone  (FLONASE ) 50 MCG/ACT nasal spray Place 1 spray into both nostrils daily.   ibuprofen  (ADVIL ) 800 MG tablet Take 1 tablet (800 mg total) by mouth every 8 (eight) hours as needed, for pain. Take with food.   insulin  degludec (TRESIBA  FLEXTOUCH) 200 UNIT/ML FlexTouch Pen Inject 60 Units into the skin daily.   insulin  glargine-yfgn (SEMGLEE , YFGN,) 100 UNIT/ML Pen    insulin  lispro (HUMALOG  KWIKPEN) 200 UNIT/ML KwikPen Inject 25-30 Units into the skin 3 (three) times daily before meals.   Insulin  Pen Needle (PENTIPS GENERIC PEN NEEDLES) 32G X 4 MM MISC Use 5 times a day   levothyroxine  (SYNTHROID ) 100 MCG tablet Take 1 tablet (100 mcg total) by mouth daily before breakfast.   methocarbamol  (ROBAXIN ) 500 MG tablet Take 1 tablet (500 mg  total) by mouth 2 (two) times daily.   montelukast  (SINGULAIR ) 10 MG tablet Take 1 tablet (10 mg total) by mouth daily as needed.   rosuvastatin  (CRESTOR ) 5 MG tablet Take 1 tablet (5 mg total) by mouth daily.   Semaglutide , 2 MG/DOSE, (OZEMPIC , 2 MG/DOSE,) 8 MG/3ML SOPN Inject 2 mg into the skin once  a week.   sertraline  (ZOLOFT ) 100 MG tablet Take 1 tablet (100 mg total) by mouth daily.   triamcinolone  (NASACORT  ALLERGY  24HR) 55 MCG/ACT AERO nasal inhaler Place 2 sprays into the nose daily as needed for allergies.   Vitamin D, Ergocalciferol, (DRISDOL) 1.25 MG (50000 UNIT) CAPS capsule Take 5,000 Units by mouth daily.   fluticasone -salmeterol (WIXELA INHUB ) 100-50 MCG/ACT AEPB Inhale 1 puff into the lungs 2 (two) times daily. (Patient not taking: Reported on 06/01/2024)   [DISCONTINUED] loratadine  (CLARITIN ) 10 MG tablet TAKE 1 TABLET BY MOUTH ONCE A DAY (Patient taking differently: Take 10 mg by mouth daily as needed for allergies.)   Current Facility-Administered Medications on File Prior to Visit  Medication   methocarbamol  (ROBAXIN ) tablet 500 mg     Allergies  Allergen Reactions   Bactrim [Sulfamethoxazole-Trimethoprim] Other (See Comments)    Appears to be causing DRESS   Bupropion Hives   Augmentin [Amoxicillin-Pot Clavulanate] Itching   Cyclobenzaprine  Other (See Comments)    Excessive sleepiness   Lexapro [Escitalopram] Diarrhea and Nausea Only   Pravastatin Other (See Comments)    Leg cramps/ mylagia   Atorvastatin Other (See Comments)    Leg cramps   Doxepin Hcl Other (See Comments)    Nightmares     Review Of Systems:  Constitutional:   No  weight loss, night sweats,  Fevers, chills, + fatigue, or  lassitude.  HEENT:   No headaches,  Difficulty swallowing,  Tooth/dental problems, or  Sore throat,                No sneezing, itching, ear ache, nasal congestion, post nasal drip,   CV:  No chest pain,  Orthopnea, PND, swelling in lower extremities, anasarca,  dizziness, palpitations, syncope.   GI  No heartburn, indigestion, abdominal pain, nausea, vomiting, diarrhea, change in bowel habits, loss of appetite, bloody stools.   Resp: + shortness of breath with exertion or at rest.  +excess mucus, + productive cough,  No non-productive cough,  No coughing up of blood.  + change in color of mucus.  No wheezing.  No chest wall deformity  Skin: no rash or lesions.  GU: no dysuria, change in color of urine, no urgency or frequency.  No flank pain, no hematuria   MS:  No joint pain or swelling.  + decreased range of motion.  No back pain.  Psych:  No change in mood or affect. No depression or anxiety.  No memory loss.   Vital Signs BP 133/85   Pulse 78   Temp 98.5 F (36.9 C) (Oral)   Ht 5' 5.5 (1.664 m)   Wt 185 lb 12.8 oz (84.3 kg)   LMP 07/10/2012   SpO2 99%   BMI 30.45 kg/m    Physical Exam:  General- No distress,  A&Ox3, pleasant ENT: No sinus tenderness, TM clear, pale nasal mucosa, no oral exudate,no post nasal drip, no LAN, + swollen cervical lymph nodes.  Cardiac: S1, S2, regular rate and rhythm, no murmur Chest: No wheeze/ rales/ dullness; no accessory muscle use, no nasal flaring, no sternal retractions Abd.: Soft Non-tender, ND, BS +, Body mass index is 30.45 kg/m.  Ext: No clubbing cyanosis, edema, no obvious deformities Neuro:  normal strength, MAE x 4, A&O x 3 appropriate Skin: No rashes, warm and dry, no obvious skin lesions Psych: normal mood and behavior  Assessment & Plan Cough Dyspnea  Discolored secretions  Post Covid x 10 days Not using maintenance inhaler Using rescue  as maintenance Plan I have sent in a prescription for Dulera . This is a maintenance medication. Use 2 puffs in the morning and 2 puffs in the evening Use 1 puff twice daily Rinse mouth after use. Sips of water instead of throat clearing Sugar Free Coca-Cola or Werther's originals for throat soothing. Delsym Cough syrup 5 cc's every  12 hours Non-sedating antihistamine of your choice daily ( Zyrtec , Allegra, Xyzol, Claritin  ( Generic ok)  Start back on your omeprazole  for reflux to help with cough Avoid menthol in cough drops.  Follow up in 1 month with Lauraine NP or Dr. Neda  to see if cough is better.  Please contact office for sooner follow up if symptoms do not improve or worsen or seek emergency care         Post-COVID-19 cough with shortness of breath Persistent cough and shortness of breath post-COVID-19, possibly post-viral. No asthma history. Prefers Dulera  for its mist form. - Prescribed doxycycline  100 mg BID for potential post-viral infection. - Prescribed Dulera , two puffs BID, with three refills. - Refilled albuterol  as rescue inhaler. - Advised rinsing mouth post-inhaler use. - Recommended sips of water instead of throat clearing to reduce cough. - Suggested sugar-free Jolly Ranchers or Werther's Originals, avoid menthol cough drops. - Considered spacer use with inhalers for better delivery. - Follow-up in one month with provider or Dr. Valorie.  Gastroesophageal reflux disease (GERD) GERD may exacerbate cough. Previously treated with omeprazole , not currently taking. - Recommended restarting OTC omeprazole  for reflux management. - Advised avoiding menthol, mint, and spearmint. - Suggested OTC non-sedating antihistamines like Zyrtec , Allegra, Xyzal, or Claritin .  I spent 20 minutes dedicated to the care of this patient on the date of this encounter to include pre-visit review of records, face-to-face time with the patient discussing conditions above, post visit ordering of testing, clinical documentation with the electronic health record, making appropriate referrals as documented, and communicating necessary information to the patient's healthcare team.     Lauraine JULIANNA Lites, NP 06/01/2024  9:29 AM

## 2024-06-08 ENCOUNTER — Other Ambulatory Visit: Payer: Self-pay

## 2024-06-08 ENCOUNTER — Other Ambulatory Visit (HOSPITAL_COMMUNITY): Payer: Self-pay

## 2024-06-08 ENCOUNTER — Telehealth: Payer: Self-pay | Admitting: Family Medicine

## 2024-06-08 MED ORDER — OMEPRAZOLE 20 MG PO CPDR
20.0000 mg | DELAYED_RELEASE_CAPSULE | Freq: Every day | ORAL | 11 refills | Status: AC
  Filled 2024-06-08 (×2): qty 30, 30d supply, fill #0
  Filled 2024-07-02 – 2024-07-14 (×2): qty 30, 30d supply, fill #1
  Filled 2024-09-09: qty 30, 30d supply, fill #2

## 2024-06-08 NOTE — Telephone Encounter (Signed)
 I called and spoke with patient, advised that a prescription has been sent to [facility] outpatient pharmacy for the omeprazole .  She was asking for a spacer for her inhaler, she said that we did not have any when she was in the office.  I told her I looked and we did not have any currently.  I told her it looked like she was coming to see Dr. Neda in October and we could look again then and she is more than welcome to call in the meantime to see if we get any in.  She verbalized understanding.  Nothing further needed.

## 2024-06-08 NOTE — Telephone Encounter (Signed)
 Copied from CRM #[id number]. Topic: Clinical - Medication Refill >> Jun 08, 2024  9:39 AM Nathanel DEL wrote: Medication: Omeprazole   Has the patient contacted their pharmacy? No Pt would like an Rx for this med.  She states in the past Lauraine had written one  This is the patient's preferred pharmacy:  [facility] [address] [place] [address] Phone: [phone] Fax: [phone]  Is this the correct pharmacy for this prescription? Yes If no, delete pharmacy and type the correct one.   Has the prescription been filled recently? No  Is the patient out of the medication? Yes  Has the patient been seen for an appointment in the last year OR does the patient have an upcoming appointment? No  Can we respond through MyChart? No  Agent: Please be advised that Rx refills may take up to 3 business days. We ask that you follow-up with your pharmacy.  Pt would like to know if you have any spacers for the inhalers?  When she was in there last week you had none. Please call to let her hr know Cb  [phone]

## 2024-06-09 ENCOUNTER — Other Ambulatory Visit: Payer: Self-pay

## 2024-06-09 ENCOUNTER — Telehealth: Payer: Self-pay

## 2024-06-09 ENCOUNTER — Other Ambulatory Visit (HOSPITAL_COMMUNITY): Payer: Self-pay

## 2024-06-09 MED ORDER — SPACER/AERO-HOLDING CHAMBERS DEVI
0 refills | Status: AC
  Filled 2024-06-09: qty 1, 30d supply, fill #0

## 2024-06-09 NOTE — Telephone Encounter (Signed)
 Copied from CRM #[id number]. Topic: Clinical - Prescription Issue >> Jun 08, 2024  1:46 PM Isabell A wrote: Reason for CRM: Patient is requesting a prescription for a spacer for her inhaler sent to:    [employer] [facility] [address] [place] [address] Phone: [phone] Fax: [phone] >> Jun 09, 2024  9:43 AM Rozanna MATSU wrote: Pt calling to see when space for inhaler will be sent to [facility] w/ PT Rx sent to pharmacy  [facility]    -NFN

## 2024-06-12 ENCOUNTER — Other Ambulatory Visit: Payer: Self-pay

## 2024-06-12 ENCOUNTER — Other Ambulatory Visit (HOSPITAL_COMMUNITY): Payer: Self-pay

## 2024-06-15 ENCOUNTER — Other Ambulatory Visit (HOSPITAL_COMMUNITY): Payer: Self-pay

## 2024-06-17 ENCOUNTER — Other Ambulatory Visit (HOSPITAL_COMMUNITY): Payer: Self-pay

## 2024-06-18 ENCOUNTER — Other Ambulatory Visit (HOSPITAL_COMMUNITY): Payer: Self-pay

## 2024-06-28 ENCOUNTER — Other Ambulatory Visit (HOSPITAL_COMMUNITY): Payer: Self-pay

## 2024-06-29 ENCOUNTER — Ambulatory Visit: Admitting: Pulmonary Disease

## 2024-07-02 ENCOUNTER — Other Ambulatory Visit: Payer: Self-pay

## 2024-07-02 ENCOUNTER — Telehealth: Payer: Self-pay | Admitting: Pharmacy Technician

## 2024-07-02 ENCOUNTER — Encounter: Payer: Self-pay | Admitting: Pharmacist

## 2024-07-02 ENCOUNTER — Other Ambulatory Visit: Payer: Self-pay | Admitting: Internal Medicine

## 2024-07-02 ENCOUNTER — Other Ambulatory Visit (HOSPITAL_COMMUNITY): Payer: Self-pay

## 2024-07-02 MED ORDER — LEVOTHYROXINE SODIUM 100 MCG PO TABS
100.0000 ug | ORAL_TABLET | Freq: Every day | ORAL | 1 refills | Status: AC
  Filled 2024-07-02: qty 90, 90d supply, fill #0

## 2024-07-02 NOTE — Telephone Encounter (Signed)
 Pharmacy Patient Advocate Encounter   Received notification from CoverMyMeds that prior authorization for Tresiba  FlexTouch (insulin  degludec injection) 200 Units/mL solution  is due for renewal.   Insurance verification completed.   The patient is insured through [facility] MEDICAID.  Action: PA required; PA submitted to above mentioned insurance via Latent Key/confirmation #/EOC [id number] Status is pending

## 2024-07-02 NOTE — Telephone Encounter (Signed)
 Pharmacy Patient Advocate Encounter  Received notification from [facility] MEDICAID that Prior Authorization for Tresiba  FlexTouch (insulin  degludec injection) 200 Units/mL solution  has been CANCELLED due to it being approved by another PA that was completed. PA expires 04/27/25     PA #/Case ID/Reference #: [id number]

## 2024-07-02 NOTE — Telephone Encounter (Signed)
 Refill request complete

## 2024-07-03 ENCOUNTER — Other Ambulatory Visit (HOSPITAL_COMMUNITY): Payer: Self-pay

## 2024-07-03 ENCOUNTER — Encounter: Payer: Self-pay | Admitting: Acute Care

## 2024-07-03 ENCOUNTER — Ambulatory Visit: Admitting: Acute Care

## 2024-07-03 VITALS — BP 136/81 | HR 86 | Temp 97.3°F | Ht 65.0 in | Wt 185.4 lb

## 2024-07-03 DIAGNOSIS — R051 Acute cough: Secondary | ICD-10-CM

## 2024-07-03 DIAGNOSIS — R591 Generalized enlarged lymph nodes: Secondary | ICD-10-CM

## 2024-07-03 DIAGNOSIS — Z23 Encounter for immunization: Secondary | ICD-10-CM | POA: Diagnosis not present

## 2024-07-03 DIAGNOSIS — Z0000 Encounter for general adult medical examination without abnormal findings: Secondary | ICD-10-CM

## 2024-07-03 DIAGNOSIS — R0609 Other forms of dyspnea: Secondary | ICD-10-CM

## 2024-07-03 NOTE — Patient Instructions (Addendum)
 It is good to see you today. We will call the [facility] pharmacy to see how long it is going to be before the Dulera  is available.  The Dulera  is ready today at the pharmacy, so please pick it up today if you can. . 2 puffs in the morning and 2 puffs in the evening. Rinse mouth after use as we discussed Continue the omeprazole  as you  have been doing.  Recommended sips of water instead of throat clearing to reduce cough. - Suggested sugar-free Jolly Ranchers or Werther's Originals, avoid menthol cough drops. - Continue  spacer use with inhalers for better delivery. Delsym Cough syrup 5 cc's every 12 hours Non-sedating antihistamine of your choice daily ( Zyrtec , Allegra, Xyzol, Claritin  ( Generic ok)  Start back on your omeprazole  for reflux to help with cough Avoid menthol in cough drops Follow up as needed . Call to be seen for cough early on so we can get it treated early on. Flu shot today.. Please contact office for sooner follow up if symptoms do not improve or worsen or seek emergency care

## 2024-07-03 NOTE — Progress Notes (Addendum)
 History of Present Illness Samantha [name] is a 58 y.o. female never smoker with acute cough and dyspnea. She is followed by Dr. Neda.       07/03/2024 Discussed the use of AI scribe software for clinical note transcription with the patient, who gave verbal consent to proceed.  History of Present Illness Samantha [name] is a 58 year old female with diabetes who presents with persistent cough and shortness of breath following a COVID-19 infection. She has been followed through our clinic for cough in the past, but she has not been seen since 03/2023.   She has been experiencing a persistent cough and shortness of breath since a COVID-19 infection diagnosed on May 22, 2024. The cough was productive of gray sputum, which was previously clear. Shortness of breath occurred with minimal exertion, such as talking or walking, and was accompanied by chest pain. Treatment with Paxlovid  and prednisone  provided some relief, but symptoms persisted. It has only been 10 days since her diagnosis, and with her underlying diabetes and breathing issues, it may take her a bit more time  to return to baseline.  She has a history of using Wixela (two puffs in the morning and evening) and albuterol  as a rescue inhaler. However, she stopped using Wixela approximately eight to nine months ago and has been relying solely on albuterol . She uses her albuterol  as a maintenance.   She has a history of diabetes, which she believes is affecting her recovery.   She was seen 06/01/2024, and we treated her with Doxycycline ,started Dulera  with a spacer, started cough prevention  techniques, and added a non sedating anti-histamine and omeprazole . She is here today for follow up to ensure she is doing well.   Pt. States she has been much better after treatment  with antibiotics .Cough is much less. Secretions are clear. She has less dyspnea. She has been unable to get the Dulera  due to back order at the pharmacy.  She has been using her albuterol  as needed, about once daily for shortness of breath. She does still have cervical lymphadenopathy. This has persisted since her Covid Diagnosis. She had a flu vaccine today, so I will check a CT Chest in 6-8 weeks to ensure this is nothing more than reactive nodes.    Test Results:     Latest Ref Rng & Units 05/19/2023    1:25 AM 05/19/2023   12:51 AM 03/05/2023    9:05 AM  CBC  WBC 4.0 - 10.5 K/uL  10.3  9.4   Hemoglobin 12.0 - 15.0 g/dL 86.0  86.6  86.7   Hematocrit 36.0 - 46.0 % 41.0  40.9  39.4   Platelets 150 - 400 K/uL  328  363        Latest Ref Rng & Units 05/19/2023    1:25 AM 03/05/2023    9:05 AM 07/18/2022    2:45 PM  BMP  Glucose 70 - 99 mg/dL 843  89  875   BUN 6 - 20 mg/dL 15  17  12    Creatinine 0.44 - 1.00 mg/dL 9.09  9.17  9.16   BUN/Creat Ratio 9 - 23  21    Sodium 135 - 145 mmol/L 144  142  144   Potassium 3.5 - 5.1 mmol/L 3.7  4.3  4.1   Chloride 98 - 111 mmol/L 110  104  108   CO2 20 - 29 mmol/L  25  26   Calcium  8.7 - 10.2 mg/dL  9.4  9.3     BNP No results found for: BNP  ProBNP No results found for: PROBNP  PFT    Component Value Date/Time   FEV1PRE 1.85 05/03/2022 1302   FEV1POST 1.76 05/03/2022 1302   FVCPRE 2.22 05/03/2022 1302   FVCPOST 2.15 05/03/2022 1302   TLC 4.05 05/03/2022 1302   DLCOUNC 16.55 05/03/2022 1302   PREFEV1FVCRT 83 05/03/2022 1302   PSTFEV1FVCRT 82 05/03/2022 1302    No results found.   Past medical hx Past Medical History:  Diagnosis Date   Anemia    Anxiety    Arthritis    Chest pain of uncertain etiology    Depression    Diabetes mellitus without complication (HCC)    Type 2   Dyspnea    Hyperlipidemia    Hypothyroidism    Palpitations    Pneumonia    Thyroid  disease    Urticaria    Vitamin D deficiency      Social History   Tobacco Use   Smoking status: Never    Passive exposure: Past   Smokeless tobacco: Never  Vaping Use   Vaping status: Never Used   Substance Use Topics   Alcohol use: No    Alcohol/week: 0.0 standard drinks of alcohol   Drug use: Never    Ms.Nangle reports that she has never smoked. She has been exposed to tobacco smoke. She has never used smokeless tobacco. She reports that she does not drink alcohol and does not use drugs.  Tobacco Cessation: Counseling given: Not Answered Never smoker   Past surgical hx, Family hx, Social hx all reviewed.  Current Outpatient Medications on File Prior to Visit  Medication Sig   albuterol  (VENTOLIN  HFA) 108 (90 Base) MCG/ACT inhaler Inhale 1-2 puffs into the lungs every 6 (six) hours as needed for wheezing or shortness of breath.   azelastine  (ASTELIN ) 0.1 % nasal spray Place 2 sprays into both nostrils 2 (two) times daily as needed. Use in each nostril as directed   cetirizine  (ZYRTEC  ALLERGY ) 10 MG tablet Take 1 tablet (10 mg total) by mouth daily.   cholecalciferol (VITAMIN D3) 25 MCG (1000 UNIT) tablet 1 capsule.   Continuous Glucose Sensor (DEXCOM G7 SENSOR) MISC Use to monitor glucose continuously, change sensor every 10 days   dapagliflozin  propanediol (FARXIGA ) 10 MG TABS tablet Take 1 tablet (10 mg total) by mouth daily.   doxycycline  (VIBRA -TABS) 100 MG tablet Take 1 tablet (100 mg total) by mouth 2 (two) times daily.   fluticasone  (FLONASE ) 50 MCG/ACT nasal spray Place 1 spray into both nostrils daily.   ibuprofen  (ADVIL ) 800 MG tablet Take 1 tablet (800 mg total) by mouth every 8 (eight) hours as needed, for pain. Take with food.   insulin  degludec (TRESIBA  FLEXTOUCH) 200 UNIT/ML FlexTouch Pen Inject 60 Units into the skin daily.   insulin  glargine-yfgn (SEMGLEE , YFGN,) 100 UNIT/ML Pen    insulin  lispro (HUMALOG  KWIKPEN) 200 UNIT/ML KwikPen Inject 25-30 Units into the skin 3 (three) times daily before meals.   Insulin  Pen Needle (PENTIPS GENERIC PEN NEEDLES) 32G X 4 MM MISC Use 5 times a day   levothyroxine  (SYNTHROID ) 100 MCG tablet Take 1 tablet (100 mcg total) by  mouth daily before breakfast.   methocarbamol  (ROBAXIN ) 500 MG tablet Take 1 tablet (500 mg total) by mouth 2 (two) times daily.   mometasone -formoterol  (DULERA ) 100-5 MCG/ACT AERO Inhale 2 puffs into the lungs 2 (two) times daily.   montelukast  (SINGULAIR ) 10 MG tablet  Take 1 tablet (10 mg total) by mouth daily as needed.   omeprazole  (PRILOSEC) 20 MG capsule Take 1 capsule (20 mg total) by mouth daily.   rosuvastatin  (CRESTOR ) 5 MG tablet Take 1 tablet (5 mg total) by mouth daily.   Semaglutide , 2 MG/DOSE, (OZEMPIC , 2 MG/DOSE,) 8 MG/3ML SOPN Inject 2 mg into the skin once a week.   sertraline  (ZOLOFT ) 100 MG tablet Take 1 tablet (100 mg total) by mouth daily.   Spacer/Aero-Holding Raguel FRENCH Use as directed   triamcinolone  (NASACORT  ALLERGY  24HR) 55 MCG/ACT AERO nasal inhaler Place 2 sprays into the nose daily as needed for allergies.   Vitamin D, Ergocalciferol, (DRISDOL) 1.25 MG (50000 UNIT) CAPS capsule Take 5,000 Units by mouth daily.   [DISCONTINUED] loratadine  (CLARITIN ) 10 MG tablet TAKE 1 TABLET BY MOUTH ONCE A DAY (Patient taking differently: Take 10 mg by mouth daily as needed for allergies.)   Current Facility-Administered Medications on File Prior to Visit  Medication   methocarbamol  (ROBAXIN ) tablet 500 mg     Allergies  Allergen Reactions   Bactrim [Sulfamethoxazole-Trimethoprim] Other (See Comments)    Appears to be causing DRESS   Bupropion Hives   Augmentin [Amoxicillin-Pot Clavulanate] Itching   Cyclobenzaprine  Other (See Comments)    Excessive sleepiness   Lexapro [Escitalopram] Diarrhea and Nausea Only   Pravastatin Other (See Comments)    Leg cramps/ mylagia   Atorvastatin Other (See Comments)    Leg cramps   Doxepin Hcl Other (See Comments)    Nightmares     Review Of Systems:  Constitutional:   No  weight loss, night sweats,  Fevers, chills, fatigue, or  lassitude.  HEENT:   No headaches,  Difficulty swallowing,  Tooth/dental problems, or  Sore  throat,                No sneezing, itching, ear ache, nasal congestion, + post nasal drip,  CV:  No chest pain,  Orthopnea, PND, swelling in lower extremities, anasarca, dizziness, palpitations, syncope.   GI  No heartburn, indigestion, abdominal pain, nausea, vomiting, diarrhea, change in bowel habits, loss of appetite, bloody stools.   Resp: + but improved shortness of breath with exertion none at rest.  No excess mucus, + much improved productive cough,  No non-productive cough,  No coughing up of blood.  No change in color of mucus.  No wheezing.  No chest wall deformity  Skin: no rash or lesions.  GU: no dysuria, change in color of urine, no urgency or frequency.  No flank pain, no hematuria   MS:  No joint pain or swelling.  No decreased range of motion.  No back pain.  Psych:  No change in mood or affect. No depression or anxiety.  No memory loss.   Vital Signs BP 136/81   Pulse 86   Temp (!) 97.3 F (36.3 C) (Temporal)   Ht 5' 5 (1.651 m)   Wt 185 lb 6.4 oz (84.1 kg)   LMP 07/10/2012   SpO2 96%   BMI 30.85 kg/m    Physical Exam:  General- No distress,  A&Ox3, pleasant ENT: No sinus tenderness, TM clear, pale nasal mucosa, no oral exudate,no post nasal drip, + cervical  LAN Cardiac: S1, S2, regular rate and rhythm, no murmur Chest: No wheeze/ rales/ dullness; no accessory muscle use, no nasal flaring, no sternal retractions, slightly diminished per bases Abd.: Soft Non-tender, ND, BS +, Body mass index is 30.85 kg/m.  Ext: No clubbing cyanosis, edema,  no obvious deformities Neuro:  normal strength, MAE x 4, A&O x 3, appropriate Skin: No rashes, warm and dry, no obvious skin lesions  Psych: normal mood and behavior  Physical Exam     Assessment & Plan Improved persistent cough after tx with ABX and cough suppression techniques Dyspnea with exertion  GERD Health Maintenance Plan We will call the [facility] pharmacy to see how long it is going to be before  the Dulera  is available.  The Dulera  is ready today at the pharmacy, so please pick it up today if you can. . 2 puffs in the morning and 2 puffs in the evening. Rinse mouth after use as we discussed - Continue  spacer use with inhalers for better delivery. Continue  sips of water instead of throat clearing to reduce cough. - Suggested sugar-free [employer] or [employer], avoid menthol cough drops. Delsym Cough syrup 5 cc's every 12 hours Non-sedating antihistamine of your choice daily ( Zyrtec , Allegra, Xyzol, Claritin  ( Generic ok)  Continue  omeprazole  for reflux to help with cough Avoid menthol in cough drops Follow up as needed . Call to be seen for cough early on so we can get it treated early on. Flu shot today. Please contact office for sooner follow up if symptoms do not improve or worsen or seek emergency care     I spent 20 minutes dedicated to the care of this patient on the date of this encounter to include pre-visit review of records, face-to-face time with the patient discussing conditions above, post visit ordering of testing, clinical documentation with the electronic health record, making appropriate referrals as documented, and communicating necessary information to the patient's healthcare team.     Lauraine JULIANNA Lites, NP 07/03/2024  8:34 AM

## 2024-07-07 ENCOUNTER — Other Ambulatory Visit: Payer: Self-pay

## 2024-07-10 ENCOUNTER — Telehealth: Payer: Self-pay | Admitting: Pharmacy Technician

## 2024-07-10 NOTE — Telephone Encounter (Signed)
 Pharmacy Patient Advocate Encounter   Received notification from CoverMyMeds that prior authorization for [id number] is due for renewal.   Insurance verification completed.   The patient is insured through [facility] MEDICAID.  Action: Medication has been discontinued. Archived Key: [id number]  **Patient on a higher dose.**

## 2024-07-15 ENCOUNTER — Other Ambulatory Visit (HOSPITAL_COMMUNITY): Payer: Self-pay

## 2024-07-15 ENCOUNTER — Other Ambulatory Visit (HOSPITAL_BASED_OUTPATIENT_CLINIC_OR_DEPARTMENT_OTHER): Payer: Self-pay

## 2024-07-26 ENCOUNTER — Other Ambulatory Visit (HOSPITAL_COMMUNITY): Payer: Self-pay

## 2024-07-27 ENCOUNTER — Other Ambulatory Visit: Payer: Self-pay

## 2024-08-03 ENCOUNTER — Encounter (INDEPENDENT_AMBULATORY_CARE_PROVIDER_SITE_OTHER): Payer: Self-pay

## 2024-08-10 ENCOUNTER — Ambulatory Visit (HOSPITAL_COMMUNITY)
Admission: RE | Admit: 2024-08-10 | Discharge: 2024-08-10 | Disposition: A | Discharge status: Home / Self Care | Source: Ambulatory Visit | Attending: Acute Care | Admitting: Acute Care

## 2024-08-10 ENCOUNTER — Other Ambulatory Visit (HOSPITAL_COMMUNITY): Payer: Self-pay

## 2024-08-10 DIAGNOSIS — R591 Generalized enlarged lymph nodes: Secondary | ICD-10-CM | POA: Insufficient documentation

## 2024-08-10 MED ORDER — SERTRALINE HCL 100 MG PO TABS: 150.0000 mg | ORAL_TABLET | Freq: Every day | ORAL | 1 refills | Status: AC

## 2024-08-13 ENCOUNTER — Other Ambulatory Visit (HOSPITAL_COMMUNITY): Payer: Self-pay

## 2024-08-14 ENCOUNTER — Other Ambulatory Visit: Payer: Self-pay

## 2024-08-17 ENCOUNTER — Telehealth: Admitting: Internal Medicine

## 2024-08-17 ENCOUNTER — Encounter: Payer: Self-pay | Admitting: Internal Medicine

## 2024-08-17 DIAGNOSIS — E890 Postprocedural hypothyroidism: Secondary | ICD-10-CM

## 2024-08-17 DIAGNOSIS — E1165 Type 2 diabetes mellitus with hyperglycemia: Secondary | ICD-10-CM

## 2024-08-17 DIAGNOSIS — E782 Mixed hyperlipidemia: Secondary | ICD-10-CM
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
## 2024-08-17 NOTE — Patient Instructions (Addendum)
 Please use the following regimen: - Farxiga  10 mg before b'fast - Ozempic  2 mg weekly - Tresiba  60 units daily (upper thighs) - Humalog  15 min before meals 24-34 units  STOP sweet drinks!  Please continue Levothyroxine  100 mcg daily.  Take the thyroid  hormone every day, with water, at least 30 minutes before breakfast, separated by at least 4 hours from: - acid reflux medications - calcium  - iron - multivitamins  Please return in 3 months.

## 2024-08-19 ENCOUNTER — Ambulatory Visit: Admitting: Podiatry

## 2024-08-21 ENCOUNTER — Encounter: Payer: Self-pay | Admitting: Acute Care

## 2024-08-21 ENCOUNTER — Other Ambulatory Visit (HOSPITAL_COMMUNITY): Payer: Self-pay

## 2024-08-21 ENCOUNTER — Ambulatory Visit: Admitting: Acute Care

## 2024-08-21 ENCOUNTER — Telehealth: Payer: Self-pay

## 2024-08-21 ENCOUNTER — Telehealth (HOSPITAL_COMMUNITY): Payer: Self-pay

## 2024-08-21 VITALS — BP 122/76 | HR 103 | Temp 98.1°F | Ht 65.5 in | Wt 182.4 lb

## 2024-08-21 DIAGNOSIS — R591 Generalized enlarged lymph nodes: Secondary | ICD-10-CM

## 2024-08-21 NOTE — Telephone Encounter (Signed)
 Pharmacy Patient Advocate Encounter   Received notification from Physician's Office that prior authorization for Ozempic  (2 MG/DOSE) 8MG /3ML pen-injectors is required/requested.   Insurance verification completed.   The patient is insured through [facility].   Per test claim: PA required; PA submitted to above mentioned insurance via Latent Key/confirmation #/EOC [id number] Status is pending

## 2024-08-21 NOTE — Progress Notes (Unsigned)
 History of Present Illness Samantha [name] is a 58 y.o. female  never smoker followed for acute cough and dyspnea after Covid. She is followed by Dr. Neda.   Synopsis Samantha [name]  is a 58 year old female with diabetes followed post Covid 05/2024 for some lingering cervical lymphadenopathy. She has a remote history of pneumonia and an episode of acute respiratory failure for which she used Wixella. No diagnosis of asthma that I can see.    08/21/2024 Discussed the use of AI scribe software for clinical note transcription with the patient, who gave verbal consent to proceed.  History of Present Illness Pt. Presents for follow up after CT Chest done to re-evaluate cervical lymphadenopathy. We have reviewed her CT Chest. There is no lymphadenopathy in the chest. Cervical lymph nodes are normal. She is on ozempic , and her A1C is much improved. She has also lost 20 pounds. I offered an ENT to better evaluate her concerns of cervical lymphadenopathy, but she does not feel that is necessary at present.  Cough has resolved.  She does endorse some shortness of breath with exertion. She had PFT's in 2023 that showed some mild restriction and a minimal diffusion deficit.  She does not feel she needs to repeat pulmonary function tests at this juncture.  She was offered a follow-up visit however states that she would prefer to  follow up as needed.      Test Results: CT Chest 08/10/2024 No lymphadenopathy No acute findings   COVID 19 diagnosed 05/22/2024 Sleep Study 11/2021, AHI of less than 5 Echo 2021>> normal left and right cardiac size and pressures  PFT 04/2022 >>> Mild restriction, Minimal diffusion deficit.      Latest Ref Rng & Units 05/19/2023    1:25 AM 05/19/2023   12:51 AM 03/05/2023    9:05 AM  CBC  WBC 4.0 - 10.5 K/uL  10.3  9.4   Hemoglobin 12.0 - 15.0 g/dL 86.0  86.6  86.7   Hematocrit 36.0 - 46.0 % 41.0  40.9  39.4   Platelets 150 - 400 K/uL  328  363        Latest Ref Rng  & Units 05/19/2023    1:25 AM 03/05/2023    9:05 AM 07/18/2022    2:45 PM  BMP  Glucose 70 - 99 mg/dL 843  89  875   BUN 6 - 20 mg/dL 15  17  12    Creatinine 0.44 - 1.00 mg/dL 9.09  9.17  9.16   BUN/Creat Ratio 9 - 23  21    Sodium 135 - 145 mmol/L 144  142  144   Potassium 3.5 - 5.1 mmol/L 3.7  4.3  4.1   Chloride 98 - 111 mmol/L 110  104  108   CO2 20 - 29 mmol/L  25  26   Calcium  8.7 - 10.2 mg/dL  9.4  9.3     BNP No results found for: BNP  ProBNP No results found for: PROBNP  PFT    Component Value Date/Time   FEV1PRE 1.85 05/03/2022 1302   FEV1POST 1.76 05/03/2022 1302   FVCPRE 2.22 05/03/2022 1302   FVCPOST 2.15 05/03/2022 1302   TLC 4.05 05/03/2022 1302   DLCOUNC 16.55 05/03/2022 1302   PREFEV1FVCRT 83 05/03/2022 1302   PSTFEV1FVCRT 82 05/03/2022 1302    CT CHEST WO CONTRAST Result Date: 08/17/2024 EXAM: CT CHEST WITHOUT CONTRAST 08/10/2024 12:00:01 PM TECHNIQUE: CT of the chest was performed without the administration of  intravenous contrast. Multiplanar reformatted images are provided for review. Automated exposure control, iterative reconstruction, and/or weight based adjustment of the mA/kV was utilized to reduce the radiation dose to as low as reasonably achievable. COMPARISON: Findings are compared to 01/24/2020. CLINICAL HISTORY: Lymphadenopathy, chest or axilla. FINDINGS: MEDIASTINUM: Heart and pericardium are unremarkable. The central airways are clear. The thyroid  gland is atrophic or absent. LYMPH NODES: No mediastinal, hilar or axillary lymphadenopathy. LUNGS AND PLEURA: No focal consolidation or pulmonary edema. No pleural effusion or pneumothorax. SOFT TISSUES/BONES: No acute abnormality of the bones or soft tissues. UPPER ABDOMEN: Limited images of the upper abdomen demonstrates no acute abnormality. IMPRESSION: 1. No pathologic thoracic lymphadenopathy. 2. No acute findings. Electronically signed by: Dorethia Molt MD 08/17/2024 12:33 AM EST RP Workstation:  [id number]     Past medical hx Past Medical History:  Diagnosis Date   Anemia    Anxiety    Arthritis    Chest pain of uncertain etiology    Depression    Diabetes mellitus without complication (HCC)    Type 2   Dyspnea    Hyperlipidemia    Hypothyroidism    Palpitations    Pneumonia    Thyroid  disease    Urticaria    Vitamin D deficiency      Social History[1]  Samantha [name] reports that she has never smoked. She has been exposed to tobacco smoke. She has never used smokeless tobacco. She reports that she does not drink alcohol and does not use drugs.  Tobacco Cessation: Counseling given: Not Answered Never smoker  Past surgical hx, Family hx, Social hx all reviewed.  Current Outpatient Medications on File Prior to Visit  Medication Sig   albuterol  (VENTOLIN  HFA) 108 (90 Base) MCG/ACT inhaler Inhale 1-2 puffs into the lungs every 6 (six) hours as needed for wheezing or shortness of breath.   azelastine  (ASTELIN ) 0.1 % nasal spray Place 2 sprays into both nostrils 2 (two) times daily as needed. Use in each nostril as directed   cetirizine  (ZYRTEC  ALLERGY ) 10 MG tablet Take 1 tablet (10 mg total) by mouth daily.   cholecalciferol (VITAMIN D3) 25 MCG (1000 UNIT) tablet 1 capsule.   Continuous Glucose Sensor (DEXCOM G7 SENSOR) MISC Use to monitor glucose continuously, change sensor every 10 days   dapagliflozin  propanediol (FARXIGA ) 10 MG TABS tablet Take 1 tablet (10 mg total) by mouth daily.   fluticasone  (FLONASE ) 50 MCG/ACT nasal spray Place 1 spray into both nostrils daily.   ibuprofen  (ADVIL ) 800 MG tablet Take 1 tablet (800 mg total) by mouth every 8 (eight) hours as needed, for pain. Take with food.   insulin  degludec (TRESIBA  FLEXTOUCH) 200 UNIT/ML FlexTouch Pen Inject 60 Units into the skin daily.   insulin  glargine-yfgn (SEMGLEE , YFGN,) 100 UNIT/ML Pen    insulin  lispro (HUMALOG  KWIKPEN) 200 UNIT/ML KwikPen Inject 25-30 Units into the skin 3 (three) times daily  before meals.   Insulin  Pen Needle (PENTIPS GENERIC PEN NEEDLES) 32G X 4 MM MISC Use 5 times a day   levothyroxine  (SYNTHROID ) 100 MCG tablet Take 1 tablet (100 mcg total) by mouth daily before breakfast.   methocarbamol  (ROBAXIN ) 500 MG tablet Take 1 tablet (500 mg total) by mouth 2 (two) times daily.   mometasone -formoterol  (DULERA ) 100-5 MCG/ACT AERO Inhale 2 puffs into the lungs 2 (two) times daily.   montelukast  (SINGULAIR ) 10 MG tablet Take 1 tablet (10 mg total) by mouth daily as needed.   omeprazole  (PRILOSEC) 20 MG capsule Take 1 capsule (  20 mg total) by mouth daily.   rosuvastatin  (CRESTOR ) 5 MG tablet Take 1 tablet (5 mg total) by mouth daily.   Semaglutide , 2 MG/DOSE, (OZEMPIC , 2 MG/DOSE,) 8 MG/3ML SOPN Inject 2 mg into the skin once a week.   sertraline  (ZOLOFT ) 100 MG tablet Take 1 tablet (100 mg total) by mouth daily.   sertraline  (ZOLOFT ) 100 MG tablet Take 1.5 tablets (150 mg total) by mouth daily.   Spacer/Aero-Holding Raguel FRENCH Use as directed   triamcinolone  (NASACORT  ALLERGY  24HR) 55 MCG/ACT AERO nasal inhaler Place 2 sprays into the nose daily as needed for allergies.   Vitamin D, Ergocalciferol, (DRISDOL) 1.25 MG (50000 UNIT) CAPS capsule Take 5,000 Units by mouth daily.   [DISCONTINUED] loratadine  (CLARITIN ) 10 MG tablet TAKE 1 TABLET BY MOUTH ONCE A DAY (Patient taking differently: Take 10 mg by mouth daily as needed for allergies.)   Current Facility-Administered Medications on File Prior to Visit  Medication   methocarbamol  (ROBAXIN ) tablet 500 mg     Allergies[2]  Review Of Systems:  Constitutional:   No  weight loss, night sweats,  Fevers, chills, fatigue, or  lassitude.  HEENT:   No headaches,  Difficulty swallowing,  Tooth/dental problems, or  Sore throat,                No sneezing, itching, ear ache, nasal congestion, post nasal drip,   CV:  No chest pain,  Orthopnea, PND, swelling in lower extremities, anasarca, dizziness, palpitations, syncope.    GI  No heartburn, indigestion, abdominal pain, nausea, vomiting, diarrhea, change in bowel habits, loss of appetite, bloody stools.   Resp: No shortness of breath with exertion or at rest.  No excess mucus, no productive cough,  No non-productive cough,  No coughing up of blood.  No change in color of mucus.  No wheezing.  No chest wall deformity  Skin: no rash or lesions.  GU: no dysuria, change in color of urine, no urgency or frequency.  No flank pain, no hematuria   MS:  No joint pain or swelling.  No decreased range of motion.  No back pain.  Psych:  No change in mood or affect. No depression or anxiety.  No memory loss.   Vital Signs BP 122/76   Pulse (!) 103   Temp 98.1 F (36.7 C) (Oral)   Ht 5' 5.5 (1.664 m)   Wt 182 lb 6.4 oz (82.7 kg)   LMP 07/10/2012   SpO2 99%   BMI 29.89 kg/m    Physical Exam:  General- No distress,  A&Ox3, appropriate ENT: No sinus tenderness, TM clear, pale nasal mucosa, no oral exudate,no post nasal drip, no LAN Cardiac: S1, S2, regular rate and rhythm, no murmur Chest: No wheeze/ rales/ dullness; no accessory muscle use, no nasal flaring, no sternal retractions Abd.: Soft Non-tender, nondistended, bowel sounds positive,Body mass index is 29.89 kg/m.  Ext: No clubbing cyanosis, edema, no obvious lower extremity deformities Neuro:  normal strength, moving all extremities x 4, alert and oriented x 3, appropriate Skin: No rashes, warm and dry, no obvious skin lesions Psych: normal mood and behavior  Physical Exam    Assessment/Plan Stable lymphadenopathy per CT imaging No acute issues Never smoker Plan Your CT Chest shows no lymph adenopathy in the chest. Your Cervical lymph nodes are barely detectable on palpation. If they flare back up, we can refer you to an ENT to be evaluated.  Just call to let us  know. If you decide you would  like Pulmonary Function Testing, let us  know.  Follow up as needed. Good luck with your weight  loss on Ozempic . Your blood sugars look great. Have a great holiday. Please contact office for sooner follow up if symptoms do not improve or worsen or seek emergency care    I spent 20 minutes dedicated to the care of this patient on the date of this encounter to include pre-visit review of records, face-to-face time with the patient discussing conditions above, post visit ordering of testing, clinical documentation with the electronic health record, making appropriate referrals as documented, and communicating necessary information to the patient's healthcare team.  Assessment & Plan        Samantha JULIANNA Lites, NP 08/21/2024  10:38 AM             [1]  Social History Tobacco Use   Smoking status: Never    Passive exposure: Past   Smokeless tobacco: Never  Vaping Use   Vaping status: Never Used  Substance Use Topics   Alcohol use: No    Alcohol/week: 0.0 standard drinks of alcohol   Drug use: Never  [2]  Allergies Allergen Reactions   Bactrim [Sulfamethoxazole-Trimethoprim] Other (See Comments)    Appears to be causing DRESS   Bupropion Hives   Augmentin [Amoxicillin-Pot Clavulanate] Itching   Cyclobenzaprine  Other (See Comments)    Excessive sleepiness   Lexapro [Escitalopram] Diarrhea and Nausea Only   Pravastatin Other (See Comments)    Leg cramps/ mylagia   Atorvastatin Other (See Comments)    Leg cramps   Doxepin Hcl Other (See Comments)    Nightmares

## 2024-08-21 NOTE — Patient Instructions (Addendum)
 It is good to see you today. Your CT Chest shows no lymph adenopathy in the chest. Your Cervical lymph nodes are barely detectable on palpation. If they flare back up, we can refer you to an ENT to be evaluated.  Just call to let us  know. If you decide you would like Pulmonary Function Testing, let us  know.  Follow up as needed. Good luck with your weight loss on Ozempic . Your blood sugars look great. Have a great holiday. Please contact office for sooner follow up if symptoms do not improve or worsen or seek emergency care

## 2024-08-22 ENCOUNTER — Other Ambulatory Visit (HOSPITAL_COMMUNITY): Payer: Self-pay

## 2024-08-24 ENCOUNTER — Encounter: Payer: Self-pay | Admitting: Podiatry

## 2024-08-24 ENCOUNTER — Ambulatory Visit: Admitting: Podiatry

## 2024-08-24 ENCOUNTER — Other Ambulatory Visit (HOSPITAL_COMMUNITY): Payer: Self-pay

## 2024-08-24 DIAGNOSIS — E1142 Type 2 diabetes mellitus with diabetic polyneuropathy: Secondary | ICD-10-CM | POA: Diagnosis not present

## 2024-08-24 NOTE — Progress Notes (Signed)
°  Subjective:  Patient ID: Samantha [name], female    DOB: [birth year],   MRN: [id number]  Chief Complaint  Patient presents with   Diabetes    I been having some pain in my toes.  It's pain then it runs into the other toes.  It be like numbness and tingling.  Saw Dr. Vianne - 08/17/2024; A1c - 7.1    58 y.o. female presents for concern of bilateral foot pain that has been ongoing for about two months. Relates burning and tingling in their feet. Patient is diabetic and last A1c was  Lab Results  Component Value Date   HGBA1C 6.6 (A) 04/13/2024   .   PCP:  Teresa Channel, MD    . Denies any other pedal complaints. Denies n/v/f/c.   Past Medical History:  Diagnosis Date   Anemia    Anxiety    Arthritis    Chest pain of uncertain etiology    Depression    Diabetes mellitus without complication (HCC)    Type 2   Dyspnea    Hyperlipidemia    Hypothyroidism    Palpitations    Pneumonia    Thyroid  disease    Urticaria    Vitamin D deficiency     Objective:  Physical Exam: Vascular: DP/PT pulses 2/4 bilateral. CFT <3 seconds. Normal hair growth on digits. No edema.  Skin. No lacerations or abrasions bilateral feet. Nails 1-5 bilateral normal in appearance.  Musculoskeletal: MMT 5/5 bilateral lower extremities in DF, PF, Inversion and Eversion. Deceased ROM in DF of ankle joint.  Neurological: Sensation intact to light touch. Protective sensation intact.   Assessment:   1. Type 2 diabetes mellitus with peripheral neuropathy (HCC)      Plan:  Patient was evaluated and treated and all questions answered. -Discussed and educated patient on diabetic foot care, especially with  regards to the vascular, neurological and musculoskeletal systems.  -Stressed the importance of good glycemic control and the detriment of not  controlling glucose levels in relation to the foot. -Discussed supportive shoes at all times and checking feet regularly.  -Answered all patient  questions -Patient to return  in 1 year for DM foot check.  -Patient advised to call the office if any problems or questions arise in the meantime.   Asberry Failing, DPM

## 2024-08-25 ENCOUNTER — Other Ambulatory Visit (HOSPITAL_COMMUNITY): Payer: Self-pay

## 2024-08-25 ENCOUNTER — Encounter: Payer: Self-pay | Admitting: Acute Care

## 2024-08-27 ENCOUNTER — Other Ambulatory Visit (HOSPITAL_COMMUNITY): Payer: Self-pay

## 2024-08-28 ENCOUNTER — Other Ambulatory Visit (HOSPITAL_COMMUNITY): Payer: Self-pay

## 2024-08-28 ENCOUNTER — Other Ambulatory Visit (HOSPITAL_BASED_OUTPATIENT_CLINIC_OR_DEPARTMENT_OTHER): Payer: Self-pay

## 2024-08-28 ENCOUNTER — Other Ambulatory Visit: Payer: Self-pay

## 2024-08-28 MED ORDER — IBUPROFEN 800 MG PO TABS
800.0000 mg | ORAL_TABLET | Freq: Three times a day (TID) | ORAL | 0 refills | Status: AC | PRN
  Filled 2024-08-28: qty 60, 20d supply, fill #0

## 2024-09-05 ENCOUNTER — Other Ambulatory Visit (HOSPITAL_COMMUNITY): Payer: Self-pay

## 2024-09-07 ENCOUNTER — Other Ambulatory Visit: Payer: Self-pay

## 2024-09-07 DIAGNOSIS — E1165 Type 2 diabetes mellitus with hyperglycemia: Secondary | ICD-10-CM

## 2024-09-07 MED ORDER — DEXCOM G7 SENSOR MISC: Status: AC

## 2024-09-08 ENCOUNTER — Other Ambulatory Visit (HOSPITAL_COMMUNITY): Payer: Self-pay

## 2024-09-08 ENCOUNTER — Other Ambulatory Visit: Payer: Self-pay

## 2024-09-08 MED ORDER — FREESTYLE LITE TEST VI STRP
ORAL_STRIP | 12 refills | Status: AC
  Filled 2024-09-08: qty 100, 50d supply, fill #0
  Filled 2024-09-09: qty 200, 100d supply, fill #1
  Filled 2024-09-09: qty 100, 34d supply, fill #0
  Filled 2024-09-09: qty 100, 50d supply, fill #1

## 2024-09-08 NOTE — Addendum Note (Signed)
 Addended by: CLEOTILDE ROLIN RAMAN on: 09/08/2024 09:31 AM   Modules accepted: Orders

## 2024-09-09 ENCOUNTER — Other Ambulatory Visit: Payer: Self-pay

## 2024-09-09 ENCOUNTER — Other Ambulatory Visit (HOSPITAL_COMMUNITY): Payer: Self-pay

## 2024-09-09 MED ORDER — ACCU-CHEK SOFTCLIX LANCETS MISC
12 refills | Status: AC
  Filled 2024-09-09: qty 100, 33d supply, fill #0
  Filled 2024-09-09: qty 100, 34d supply, fill #0

## 2024-09-09 MED ORDER — ACCU-CHEK GUIDE W/DEVICE KIT
PACK | 0 refills | Status: AC
  Filled 2024-09-09 (×2): qty 1, 30d supply, fill #0

## 2024-09-14 ENCOUNTER — Other Ambulatory Visit: Payer: Self-pay

## 2024-09-22 ENCOUNTER — Other Ambulatory Visit: Payer: Self-pay

## 2024-10-07 ENCOUNTER — Other Ambulatory Visit (HOSPITAL_COMMUNITY): Payer: Self-pay

## 2024-10-12 ENCOUNTER — Other Ambulatory Visit (HOSPITAL_COMMUNITY): Payer: Self-pay

## 2024-10-12 ENCOUNTER — Other Ambulatory Visit: Payer: Self-pay

## 2024-10-12 MED ORDER — SERTRALINE HCL 100 MG PO TABS
150.0000 mg | ORAL_TABLET | Freq: Every day | ORAL | 1 refills | Status: AC
  Filled 2024-10-12: qty 135, 90d supply, fill #0

## 2024-10-13 ENCOUNTER — Other Ambulatory Visit (HOSPITAL_COMMUNITY): Payer: Self-pay

## 2024-10-13 NOTE — Telephone Encounter (Signed)
 Pharmacy Patient Advocate Encounter  Received notification from [employer] that Prior Authorization for Ozempic  (2 MG/DOSE) 8MG /3ML pen-injectors has been APPROVED from 08/21/2024 to 08/21/2025   PA #/Case ID/Reference #: [id number]
